# Patient Record
Sex: Female | Born: 1960 | Race: White | Hispanic: No | Marital: Married | Smoking: Never smoker
Health system: Southern US, Community
[De-identification: ages and names within clinical notes are randomized; demographics above are authoritative.]

## PROBLEM LIST (undated history)

## (undated) DIAGNOSIS — E739 Lactose intolerance, unspecified: Secondary | ICD-10-CM

## (undated) DIAGNOSIS — M549 Dorsalgia, unspecified: Secondary | ICD-10-CM

## (undated) DIAGNOSIS — L409 Psoriasis, unspecified: Secondary | ICD-10-CM

## (undated) DIAGNOSIS — C801 Malignant (primary) neoplasm, unspecified: Secondary | ICD-10-CM

## (undated) DIAGNOSIS — G4730 Sleep apnea, unspecified: Secondary | ICD-10-CM

## (undated) DIAGNOSIS — G4700 Insomnia, unspecified: Secondary | ICD-10-CM

## (undated) DIAGNOSIS — E669 Obesity, unspecified: Secondary | ICD-10-CM

## (undated) DIAGNOSIS — K760 Fatty (change of) liver, not elsewhere classified: Secondary | ICD-10-CM

## (undated) DIAGNOSIS — K219 Gastro-esophageal reflux disease without esophagitis: Secondary | ICD-10-CM

## (undated) DIAGNOSIS — E039 Hypothyroidism, unspecified: Secondary | ICD-10-CM

## (undated) DIAGNOSIS — E063 Autoimmune thyroiditis: Secondary | ICD-10-CM

## (undated) DIAGNOSIS — K589 Irritable bowel syndrome without diarrhea: Secondary | ICD-10-CM

## (undated) DIAGNOSIS — M2550 Pain in unspecified joint: Secondary | ICD-10-CM

## (undated) DIAGNOSIS — L900 Lichen sclerosus et atrophicus: Secondary | ICD-10-CM

## (undated) DIAGNOSIS — I10 Essential (primary) hypertension: Secondary | ICD-10-CM

## (undated) DIAGNOSIS — F329 Major depressive disorder, single episode, unspecified: Secondary | ICD-10-CM

## (undated) DIAGNOSIS — F419 Anxiety disorder, unspecified: Secondary | ICD-10-CM

## (undated) DIAGNOSIS — E785 Hyperlipidemia, unspecified: Secondary | ICD-10-CM

## (undated) DIAGNOSIS — F32A Depression, unspecified: Secondary | ICD-10-CM

## (undated) HISTORY — DX: Hypothyroidism, unspecified: E03.9

## (undated) HISTORY — DX: Pain in unspecified joint: M25.50

## (undated) HISTORY — DX: Gastro-esophageal reflux disease without esophagitis: K21.9

## (undated) HISTORY — DX: Autoimmune thyroiditis: E06.3

## (undated) HISTORY — DX: Depression, unspecified: F32.A

## (undated) HISTORY — DX: Sleep apnea, unspecified: G47.30

## (undated) HISTORY — DX: Psoriasis, unspecified: L40.9

## (undated) HISTORY — DX: Insomnia, unspecified: G47.00

## (undated) HISTORY — DX: Lactose intolerance, unspecified: E73.9

## (undated) HISTORY — DX: Anxiety disorder, unspecified: F41.9

## (undated) HISTORY — DX: Lichen sclerosus et atrophicus: L90.0

## (undated) HISTORY — PX: CATARACT EXTRACTION W/ INTRAOCULAR LENS IMPLANT: SHX1309

## (undated) HISTORY — DX: Dorsalgia, unspecified: M54.9

## (undated) HISTORY — DX: Fatty (change of) liver, not elsewhere classified: K76.0

## (undated) HISTORY — DX: Major depressive disorder, single episode, unspecified: F32.9

## (undated) HISTORY — DX: Hyperlipidemia, unspecified: E78.5

## (undated) HISTORY — DX: Obesity, unspecified: E66.9

## (undated) HISTORY — DX: Irritable bowel syndrome, unspecified: K58.9

## (undated) HISTORY — DX: Essential (primary) hypertension: I10

## (undated) HISTORY — PX: ANTERIOR DISC ARTHROPLASTY: SHX762

---

## 2004-06-10 ENCOUNTER — Ambulatory Visit: Payer: Self-pay | Admitting: Internal Medicine

## 2004-06-16 ENCOUNTER — Ambulatory Visit: Payer: Self-pay | Admitting: Internal Medicine

## 2004-12-30 ENCOUNTER — Ambulatory Visit: Payer: Self-pay | Admitting: Internal Medicine

## 2006-05-08 ENCOUNTER — Ambulatory Visit: Payer: Self-pay | Admitting: Internal Medicine

## 2007-07-04 ENCOUNTER — Ambulatory Visit: Payer: Self-pay | Admitting: Internal Medicine

## 2008-07-30 ENCOUNTER — Ambulatory Visit: Payer: Self-pay | Admitting: Internal Medicine

## 2009-08-19 ENCOUNTER — Ambulatory Visit: Payer: Self-pay | Admitting: Internal Medicine

## 2010-09-20 ENCOUNTER — Ambulatory Visit: Payer: Self-pay | Admitting: Internal Medicine

## 2011-10-11 ENCOUNTER — Ambulatory Visit: Payer: Self-pay

## 2011-12-18 ENCOUNTER — Ambulatory Visit: Payer: Self-pay | Admitting: Unknown Physician Specialty

## 2011-12-18 HISTORY — PX: COLONOSCOPY: SHX174

## 2012-01-11 ENCOUNTER — Ambulatory Visit: Payer: Self-pay | Admitting: Family Medicine

## 2013-03-11 ENCOUNTER — Ambulatory Visit: Payer: Self-pay | Admitting: Family Medicine

## 2013-03-24 ENCOUNTER — Ambulatory Visit: Payer: Self-pay | Admitting: Family Medicine

## 2013-10-03 ENCOUNTER — Ambulatory Visit: Payer: Self-pay | Admitting: Family Medicine

## 2014-11-18 ENCOUNTER — Ambulatory Visit
Admit: 2014-11-18 | Disposition: A | Discharge status: Home / Self Care | Payer: Self-pay | Attending: Family Medicine | Admitting: Family Medicine

## 2015-10-01 DIAGNOSIS — L570 Actinic keratosis: Secondary | ICD-10-CM | POA: Diagnosis not present

## 2015-10-01 DIAGNOSIS — D225 Melanocytic nevi of trunk: Secondary | ICD-10-CM | POA: Diagnosis not present

## 2015-10-01 DIAGNOSIS — I8392 Asymptomatic varicose veins of left lower extremity: Secondary | ICD-10-CM | POA: Diagnosis not present

## 2015-10-01 DIAGNOSIS — L400 Psoriasis vulgaris: Secondary | ICD-10-CM | POA: Diagnosis not present

## 2015-10-01 DIAGNOSIS — X32XXXA Exposure to sunlight, initial encounter: Secondary | ICD-10-CM | POA: Diagnosis not present

## 2015-10-01 DIAGNOSIS — Z85828 Personal history of other malignant neoplasm of skin: Secondary | ICD-10-CM | POA: Diagnosis not present

## 2015-10-15 DIAGNOSIS — E782 Mixed hyperlipidemia: Secondary | ICD-10-CM | POA: Diagnosis not present

## 2015-10-15 DIAGNOSIS — I10 Essential (primary) hypertension: Secondary | ICD-10-CM | POA: Diagnosis not present

## 2015-10-15 DIAGNOSIS — E039 Hypothyroidism, unspecified: Secondary | ICD-10-CM | POA: Diagnosis not present

## 2016-04-18 ENCOUNTER — Encounter: Payer: Self-pay | Admitting: Physician Assistant

## 2016-04-18 ENCOUNTER — Ambulatory Visit: Payer: Self-pay | Admitting: Physician Assistant

## 2016-04-18 VITALS — BP 140/88 | HR 84 | Temp 98.0°F

## 2016-04-18 DIAGNOSIS — M62838 Other muscle spasm: Secondary | ICD-10-CM

## 2016-04-18 MED ORDER — PREDNISONE 10 MG (48) PO TBPK: ORAL_TABLET | Freq: Every day | ORAL | 0 refills | Status: DC

## 2016-04-18 MED ORDER — BACLOFEN 10 MG PO TABS: 10.0000 mg | ORAL_TABLET | Freq: Three times a day (TID) | ORAL | 0 refills | Status: DC

## 2016-04-18 NOTE — Progress Notes (Signed)
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 

## 2016-05-25 ENCOUNTER — Other Ambulatory Visit: Payer: Self-pay | Admitting: Physician Assistant

## 2016-05-25 NOTE — Telephone Encounter (Signed)
Med refill approved, pt still having back and neck pain

## 2016-07-03 DIAGNOSIS — M2550 Pain in unspecified joint: Secondary | ICD-10-CM | POA: Diagnosis not present

## 2016-07-03 DIAGNOSIS — E782 Mixed hyperlipidemia: Secondary | ICD-10-CM | POA: Diagnosis not present

## 2016-07-03 DIAGNOSIS — I10 Essential (primary) hypertension: Secondary | ICD-10-CM | POA: Diagnosis not present

## 2016-07-03 DIAGNOSIS — E039 Hypothyroidism, unspecified: Secondary | ICD-10-CM | POA: Diagnosis not present

## 2016-07-12 DIAGNOSIS — H5213 Myopia, bilateral: Secondary | ICD-10-CM | POA: Diagnosis not present

## 2016-07-12 DIAGNOSIS — H43812 Vitreous degeneration, left eye: Secondary | ICD-10-CM | POA: Diagnosis not present

## 2016-07-12 DIAGNOSIS — H52223 Regular astigmatism, bilateral: Secondary | ICD-10-CM | POA: Diagnosis not present

## 2016-07-12 DIAGNOSIS — H524 Presbyopia: Secondary | ICD-10-CM | POA: Diagnosis not present

## 2016-07-27 ENCOUNTER — Encounter (INDEPENDENT_AMBULATORY_CARE_PROVIDER_SITE_OTHER): Payer: Self-pay | Admitting: Family Medicine

## 2016-08-10 ENCOUNTER — Encounter (INDEPENDENT_AMBULATORY_CARE_PROVIDER_SITE_OTHER): Payer: Self-pay | Admitting: Family Medicine

## 2016-08-10 ENCOUNTER — Ambulatory Visit (INDEPENDENT_AMBULATORY_CARE_PROVIDER_SITE_OTHER): Payer: 59 | Admitting: Family Medicine

## 2016-08-10 VITALS — BP 131/76 | HR 104 | Temp 98.3°F | Resp 14 | Ht 63.0 in | Wt 240.0 lb

## 2016-08-10 DIAGNOSIS — R9431 Abnormal electrocardiogram [ECG] [EKG]: Secondary | ICD-10-CM | POA: Diagnosis not present

## 2016-08-10 DIAGNOSIS — R5383 Other fatigue: Secondary | ICD-10-CM

## 2016-08-10 DIAGNOSIS — Z1331 Encounter for screening for depression: Secondary | ICD-10-CM

## 2016-08-10 DIAGNOSIS — Z0289 Encounter for other administrative examinations: Secondary | ICD-10-CM

## 2016-08-10 DIAGNOSIS — E039 Hypothyroidism, unspecified: Secondary | ICD-10-CM | POA: Diagnosis not present

## 2016-08-10 DIAGNOSIS — Z9189 Other specified personal risk factors, not elsewhere classified: Secondary | ICD-10-CM | POA: Diagnosis not present

## 2016-08-10 DIAGNOSIS — R0602 Shortness of breath: Secondary | ICD-10-CM

## 2016-08-10 DIAGNOSIS — Z1389 Encounter for screening for other disorder: Secondary | ICD-10-CM | POA: Diagnosis not present

## 2016-08-10 DIAGNOSIS — G4730 Sleep apnea, unspecified: Secondary | ICD-10-CM

## 2016-08-10 DIAGNOSIS — E7800 Pure hypercholesterolemia, unspecified: Secondary | ICD-10-CM

## 2016-08-10 NOTE — Progress Notes (Signed)
Office: [phone]  /  Fax: [phone]   HPI:   Chief Complaint: OBESITY  Katherine Smith (MR# [id number]) is a 55 y.o. female who presents on 08/10/2016 for obesity evaluation and treatment. Current BMI is Body mass index is 42.51 kg/m.Marland Kitchen Katherine Smith has struggled with obesity for years and has been unsuccessful in either losing weight or maintaining long term weight loss. Katherine Smith attended our information session and states she is currently in the action stage of change and ready to dedicate time achieving and maintaining a healthier weight.  Katherine Smith states her family eats meals together she thinks her family will eat healthier with  her her desired weight is150 she has been heavy most of  her life she started gaining weight over the last 10 years. her heaviest weight ever was 247 lbs. she has significant food cravings issues  she snacks frequently in the evenings she skips meals frequently she frequently makes poor food choices she has binge eating behaviors she struggles with emotional eating    Katherine Smith feels her energy is lower than it should be. This has worsened with weight gain and has not worsened recently. Katherine Smith reports daytime somnolence and  admits to waking up still tired. Patient is at risk for obstructive sleep apnea. Patent has a history of symptoms of daytime Katherine Smith, Epworth sleepiness scale and morning headache. Patient generally gets 5 or 6 hours of sleep per night, and states they generally have restless sleep. Snoring is present. Apneic episodes are present. Epworth Sleepiness Score is 17  Hyperlipidemia Katherine Smith has hyperlipidemia and has been trying to improve her cholesterol levels with intensive lifestyle modification including a low saturated fat diet, exercise and weight loss. Patient doesn't tolerate Statins. She denies any chest pain, claudication.  Hypothyroidism Hypothyroid, Hashimoto's, currently taking 255 MG daily Levothyroxine, +Katherine Smith.  Abnormal  EKG Q waves noted on anterior leads. No history of hear disease or chest pain per patient.  Sleep Apnea Sleep apnea, not using CPAP, positive Katherine Smith. She is having a difficult time losing weight.  Dyspnea on exertion Katherine Smith notes increasing shortness of breath with exercising and seems to be worsening over time with weight gain. She notes getting out of breath sooner with activity than she used to. This has not gotten worse recently. Katherine Smith denies orthopnea.  Depression Screen Katherine Smith Food and Mood (modified PHQ-9) score was  Depression screen PHQ 2/9 08/10/2016  Decreased Interest 2  Down, Depressed, Hopeless 2  PHQ - 2 Score 4  Altered sleeping 3  Tired, decreased energy 3  Change in appetite 2  Feeling bad or failure about yourself  1  Trouble concentrating 0  Moving slowly or fidgety/restless 1  Suicidal thoughts 0  PHQ-9 Score 14    ALLERGIES: Allergies  Allergen Reactions  . Atorvastatin     Other reaction(s): Muscle Pain  . Dexfenfluramine     Other reaction(s): Unknown    MEDICATIONS: Current Outpatient Prescriptions on File Prior to Visit  Medication Sig Dispense Refill  . baclofen (LIORESAL) 10 MG tablet TAKE 1 TABLET BY MOUTH THREE TIMES DAILY 30 tablet 0  . ezetimibe (ZETIA) 10 MG tablet TAKE 1 TABLET BY MOUTH ONCE DAILY.    Marland Kitchen levothyroxine (SYNTHROID, LEVOTHROID) 200 MCG tablet TAKE 1 TABLET BY MOUTH AS DIRECTED. TAKE ONE TABLET DAILY WITH 25MCG TO EQUAL 225MCG    . levothyroxine (SYNTHROID, LEVOTHROID) 25 MCG tablet TAKE 1 TABLET BY MOUTH AS DIRECTED. TAKE ONE TABLET WITH 200MCG DAILY TO EQUAL 225MCG    .  lisinopril-hydrochlorothiazide (PRINZIDE,ZESTORETIC) 10-12.5 MG tablet TAKE ONE TABLET BY MOUTH DAILY    . pantoprazole (PROTONIX) 40 MG tablet TAKE ONE TABLET BY MOUTH ONCE DAILY    . predniSONE (STERAPRED UNI-PAK 48 TAB) 10 MG (48) TBPK tablet Take by mouth daily. Use as directed (Patient not taking: Reported on 08/10/2016) 48 tablet 0   No current  facility-administered medications on file prior to visit.     PAST MEDICAL HISTORY: Past Medical History:  Diagnosis Date  . Back pain   . Depression   . Fatty liver   . GERD (gastroesophageal reflux disease)   . Hashimoto's thyroiditis   . HTN (hypertension)   . Hyperlipidemia   . Hypothyroidism   . IBS (irritable bowel syndrome)   . Joint pain   . Lactose intolerance   . Lichen sclerosus   . Obesity   . Psoriasis   . Sleep apnea     PAST SURGICAL HISTORY: Past Surgical History:  Procedure Laterality Date  . ANTERIOR DISC ARTHROPLASTY     2004    SOCIAL HISTORY: Social History  Substance Use Topics  . Smoking status: Never Smoker  . Smokeless tobacco: Never Used  . Alcohol use Not on file    FAMILY HISTORY: Family History  Problem Relation Age of Onset  . Hypertension Mother   . Hyperlipidemia Mother   . Hypertension Father     ROS: Review of Systems  Constitutional: Positive for malaise/Katherine Smith.  HENT: Positive for congestion.   Eyes:       Wear Glasses or Contacts  Cardiovascular: Positive for claudication.       Calf/Leg Pain with Walking  Gastrointestinal: Positive for heartburn.  Musculoskeletal: Positive for back pain, myalgias and neck pain.       Muscle Stiffness  Skin: Positive for itching.       Dryness  Neurological: Positive for dizziness and headaches.  Psychiatric/Behavioral: The patient has insomnia.        Stress    PHYSICAL EXAM: Blood pressure 131/76, pulse (!) 104, temperature 98.3 F (36.8 C), temperature source Oral, resp. rate 14, height 5\' 3"  (1.6 m), weight 240 lb (108.9 kg), last menstrual period 04/14/2016, SpO2 95 %. Body mass index is 42.51 kg/m. Physical Exam  Constitutional: She is oriented to person, place, and time. She appears well-developed and well-nourished.  HENT:  Head: Normocephalic and atraumatic.  Nose: Nose normal.  Eyes: EOM are normal. No scleral icterus.  Neck: Normal range of motion. Neck supple.  No thyromegaly present.  Cardiovascular: Normal rate and regular rhythm.   Murmur heard. Pulmonary/Chest: No respiratory distress.  Abdominal: Soft. There is no tenderness.  Obesity  Musculoskeletal: Normal range of motion. She exhibits edema.  Neurological: She is alert and oriented to person, place, and time. Coordination normal.  Skin: Skin is warm and dry.  Psychiatric: She has a normal mood and affect. Her behavior is normal.    RECENT LABS AND TESTS: BMET No results found for: NA, K, CL, CO2, GLUCOSE, BUN, CREATININE, CALCIUM, GFRNONAA, GFRAA No results found for: HGBA1C No results found for: INSULIN CBC No results found for: WBC, RBC, HGB, HCT, PLT, MCV, MCH, MCHC, RDW, LYMPHSABS, MONOABS, EOSABS, BASOSABS Iron/TIBC/Ferritin/ %Sat No results found for: IRON, TIBC, FERRITIN, IRONPCTSAT Lipid Panel  No results found for: CHOL, TRIG, HDL, CHOLHDL, VLDL, LDLCALC, LDLDIRECT Hepatic Function Panel  No results found for: PROT, ALBUMIN, AST, ALT, ALKPHOS, BILITOT, BILIDIR, IBILI No results found for: TSH  ECG  shows NSR with a  rate of 89 BPM  INDIRECT CALORIMETER done today shows a VO2 of 196 and a REE of 1367.    ASSESSMENT AND PLAN: Other Katherine Smith - Plan: EKG 12-Lead, Vitamin B12, CBC With Differential, Comprehensive metabolic panel, Hemoglobin A1c, Insulin, random, T3, T4, free, TSH, VITAMIN D 25 Hydroxy (Vit-D Deficiency, Fractures), Folate  Shortness of breath on exertion - Plan: Lipid Panel With LDL/HDL Ratio  Pure hypercholesterolemia  Hypothyroidism, unspecified type  Nonspecific abnormal electrocardiogram (ECG) (EKG) - Plan: ECHOCARDIOGRAM COMPLETE  Sleep apnea, unspecified type - Plan: Lipid Panel With LDL/HDL Ratio  PLAN:  Katherine Smith Katherine Smith was informed that her Katherine Smith may be related to obesity, depression or many other causes. Labs will be ordered, and in the meanwhile Sayana has agreed to work on diet, exercise and weight loss to help with Katherine Smith. Proper  sleep hygiene was discussed including the need for 7-8 hours of quality sleep each night. A sleep study was not ordered based on symptoms and Epworth score.  Hyperlipidemia Katherine Smith was informed of the American Heart Association Guidelines emphasize intensive lifestyle modifications as the first line treatment for hyperlipidemia. We discussed many lifestyle modifications today in depth, and Katherine Smith will continue to work on decreasing saturated fats such as fatty red meat, butter and many fried foods. She will also increase vegetables and lean protein in her diet and continue to work on exercise and her weight loss efforts.Will check labs and follow.  Exercise Induced Shortness of Breath Katherine Smith shortness of breath appears to be obesity related and exercise induced. She has agreed to work on weight loss and gradually increase exercise to treat her exercise induced shortness of breath. If Katherine Smith follows our instructions and loses weight without improvement of her shortness of breath, we will plan to refer to pulmonology. We will monitor this condition regularly. Katherine Smith agrees to this plan.  Hypothyroidism Will check labs and follow. Continue medications as is.  Abnormal EKG She will get echocardiogram tomorrow  and follow up to discuss results in 2 weeks.  Sleep Apnea Katherine Smith agrees to work on wearing her CPAP more regularly. We discussed ways to acclimate to mask.  Depression Screen Katherine Smith had a positive depression screening. Depression is commonly associated with obesity and often results in emotional eating behaviors. We will monitor this closely and work on CBT to help improve the non-hunger eating patterns. Referral to Psychology may be required if no improvement is seen as she continues in our clinic.  Diabetes risk counselling Katherine Smith was given extended (at least 15 minutes) diabetes prevention counseling today. She is 55 y.o. female and has risk factors for diabetes including obesity. We  discussed intensive lifestyle modifications today with an emphasis on weight loss as well as increasing exercise and decreasing simple carbohydrates in her diet.  Obesity Katherine Smith is currently in the action stage of change and her goal is to continue with weight loss efforts She has agreed to follow the Category 2 plan Katherine Smith has been instructed to work up to a goal of 150 minutes of combined cardio and strengthening exercise per week for weight loss and overall health benefits.   Katherine Smith has agreed to follow up with our clinic in 2 weeks. She was informed of the importance of frequent follow up visits to maximize her success with intensive lifestyle modifications for her multiple health conditions. She was informed we would discuss her lab results at her next visit unless there is a critical issue that needs to be addressed sooner. Katherine Smith agreed to keep her  next visit at the agreed upon time to discuss these results.  I, Doreene Nest, am acting as scribe for Dennard Nip, MD  I have reviewed the above documentation for accuracy and completeness, and I agree with the above. -Dennard Nip, MD

## 2016-08-11 ENCOUNTER — Other Ambulatory Visit: Payer: Self-pay

## 2016-08-11 ENCOUNTER — Ambulatory Visit (INDEPENDENT_AMBULATORY_CARE_PROVIDER_SITE_OTHER): Payer: 59

## 2016-08-11 DIAGNOSIS — R9431 Abnormal electrocardiogram [ECG] [EKG]: Secondary | ICD-10-CM

## 2016-08-11 LAB — COMPREHENSIVE METABOLIC PANEL
ALT: 44 IU/L — AB (ref 0–32)
AST: 42 IU/L — AB (ref 0–40)
Albumin/Globulin Ratio: 1.5 (ref 1.2–2.2)
Albumin: 4.7 g/dL (ref 3.5–5.5)
Alkaline Phosphatase: 83 IU/L (ref 39–117)
BUN/Creatinine Ratio: 20 (ref 9–23)
BUN: 17 mg/dL (ref 6–24)
Bilirubin Total: 0.7 mg/dL (ref 0.0–1.2)
CALCIUM: 10.2 mg/dL (ref 8.7–10.2)
CO2: 24 mmol/L (ref 18–29)
CREATININE: 0.86 mg/dL (ref 0.57–1.00)
Chloride: 95 mmol/L — ABNORMAL LOW (ref 96–106)
GFR, EST AFRICAN AMERICAN: 87 mL/min/{1.73_m2} (ref >59)
GFR, EST NON AFRICAN AMERICAN: 76 mL/min/{1.73_m2} (ref >59)
Globulin, Total: 3.2 g/dL (ref 1.5–4.5)
Glucose: 91 mg/dL (ref 65–99)
Potassium: 4 mmol/L (ref 3.5–5.2)
Sodium: 137 mmol/L (ref 134–144)
TOTAL PROTEIN: 7.9 g/dL (ref 6.0–8.5)

## 2016-08-11 LAB — VITAMIN B12: VITAMIN B 12: 215 pg/mL — AB (ref 232–1245)

## 2016-08-11 LAB — LIPID PANEL WITH LDL/HDL RATIO
CHOLESTEROL TOTAL: 221 mg/dL — AB (ref 100–199)
HDL: 73 mg/dL (ref >39)
LDL Calculated: 119 mg/dL — ABNORMAL HIGH (ref 0–99)
LDl/HDL Ratio: 1.6 ratio units (ref 0.0–3.2)
TRIGLYCERIDES: 143 mg/dL (ref 0–149)
VLDL CHOLESTEROL CAL: 29 mg/dL (ref 5–40)

## 2016-08-11 LAB — CBC WITH DIFFERENTIAL
BASOS: 1 %
Basophils Absolute: 0.1 10*3/uL (ref 0.0–0.2)
EOS (ABSOLUTE): 0.2 10*3/uL (ref 0.0–0.4)
EOS: 2 %
Hematocrit: 41.3 % (ref 34.0–46.6)
Hemoglobin: 13.7 g/dL (ref 11.1–15.9)
IMMATURE GRANS (ABS): 0 10*3/uL (ref 0.0–0.1)
IMMATURE GRANULOCYTES: 0 %
LYMPHS: 24 %
Lymphocytes Absolute: 2.1 10*3/uL (ref 0.7–3.1)
MCH: 27.8 pg (ref 26.6–33.0)
MCHC: 33.2 g/dL (ref 31.5–35.7)
MCV: 84 fL (ref 79–97)
MONOS ABS: 0.6 10*3/uL (ref 0.1–0.9)
Monocytes: 7 %
NEUTROS PCT: 66 %
Neutrophils Absolute: 6 10*3/uL (ref 1.4–7.0)
RBC: 4.93 x10E6/uL (ref 3.77–5.28)
RDW: 14 % (ref 12.3–15.4)
WBC: 9 10*3/uL (ref 3.4–10.8)

## 2016-08-11 LAB — VITAMIN D 25 HYDROXY (VIT D DEFICIENCY, FRACTURES): VIT D 25 HYDROXY: 10.3 ng/mL — AB (ref 30.0–100.0)

## 2016-08-11 LAB — HEMOGLOBIN A1C
ESTIMATED AVERAGE GLUCOSE: 105 mg/dL
Hgb A1c MFr Bld: 5.3 % (ref 4.8–5.6)

## 2016-08-11 LAB — INSULIN, RANDOM: INSULIN: 38.9 u[IU]/mL — AB (ref 2.6–24.9)

## 2016-08-11 LAB — FOLATE: Folate: 11.9 ng/mL (ref >3.0)

## 2016-08-11 LAB — TSH: TSH: 1.17 u[IU]/mL (ref 0.450–4.500)

## 2016-08-11 LAB — T4, FREE: FREE T4: 2.14 ng/dL — AB (ref 0.82–1.77)

## 2016-08-11 LAB — T3: T3, Total: 140 ng/dL (ref 71–180)

## 2016-08-16 ENCOUNTER — Ambulatory Visit: Payer: Self-pay

## 2016-08-24 ENCOUNTER — Ambulatory Visit (INDEPENDENT_AMBULATORY_CARE_PROVIDER_SITE_OTHER): Payer: 59 | Admitting: Family Medicine

## 2016-08-24 ENCOUNTER — Encounter (INDEPENDENT_AMBULATORY_CARE_PROVIDER_SITE_OTHER): Payer: Self-pay | Admitting: Family Medicine

## 2016-08-24 VITALS — BP 124/85 | HR 91 | Temp 98.4°F | Ht 63.0 in | Wt 230.0 lb

## 2016-08-24 DIAGNOSIS — R7989 Other specified abnormal findings of blood chemistry: Secondary | ICD-10-CM

## 2016-08-24 DIAGNOSIS — E8881 Metabolic syndrome: Secondary | ICD-10-CM | POA: Diagnosis not present

## 2016-08-24 DIAGNOSIS — E785 Hyperlipidemia, unspecified: Secondary | ICD-10-CM | POA: Diagnosis not present

## 2016-08-24 DIAGNOSIS — E559 Vitamin D deficiency, unspecified: Secondary | ICD-10-CM | POA: Diagnosis not present

## 2016-08-24 DIAGNOSIS — Z9189 Other specified personal risk factors, not elsewhere classified: Secondary | ICD-10-CM

## 2016-08-24 DIAGNOSIS — E88819 Insulin resistance, unspecified: Secondary | ICD-10-CM | POA: Insufficient documentation

## 2016-08-24 DIAGNOSIS — R945 Abnormal results of liver function studies: Secondary | ICD-10-CM

## 2016-08-24 MED ORDER — VITAMIN D (ERGOCALCIFEROL) 1.25 MG (50000 UNIT) PO CAPS: 50000.0000 [IU] | ORAL_CAPSULE | ORAL | 0 refills | Status: DC

## 2016-08-24 MED ORDER — METFORMIN HCL 500 MG PO TABS: 500.0000 mg | ORAL_TABLET | Freq: Every day | ORAL | 0 refills | Status: DC

## 2016-08-24 NOTE — Progress Notes (Signed)
Office: [phone]  /  Fax: [phone]   HPI:   Chief Complaint: OBESITY Katherine Smith is here to discuss her progress with her obesity treatment plan. She is following her eating plan approximately 90 % of the time and states she is exercising 0 minutes 0 times per week. Katherine Smith is currently struggling with increased carb cravings.  Her weight is 230 lb (104.3 kg) today and had a weight loss of 10 lbs since her last visit. She has lost 10 lbs since starting treatment with [place]. Patient did well with weight loss but noticed an increased in carb cravings. She noted dinner was boring and had a difficult time following the plan closely at dinner.   Insulin Resistance Katherine Smith has a new diagnosis of insulin resistance based on her elevated fasting insulin level >5. Although Katherine Smith's blood glucose readings and A1C are still under good control, insulin resistance puts her at greater risk of metabolic syndrome and diabetes. She is not taking metformin currently and continues to work on diet and exercise to decrease risk of diabetes. She is positive for polyphagia with it worsening in the evening.   Vitamin D deficiency Katherine Smith has a diagnosis of vitamin D deficiency. She is not currently taking vit D and denies nausea, vomiting or muscle weakness. She is positive for fatigue.   Elevated Liver Function  Patient has a history of fatty liver per Ultrasound, she denies abdominal pain, negative for jaundice.  Hyperlipidemia Katherine Smith has hyperlipidemia and has been trying to improve her cholesterol levels with intensive lifestyle modification including a low saturated fat diet, exercise and weight loss. She denies any chest pain, claudication or myalgias. Her HDL is good but LDL is elevated but not to the level that statins are needed yet. She denies chest pain.   At risk for diabetes Katherine Smith is at higher than averagerisk for developing diabetes due to her obesity. She currently denies polyuria or  polydipsia.    Wt Readings from Last 500 Encounters:  08/24/16 230 lb (104.3 kg)  08/10/16 240 lb (108.9 kg)     ALLERGIES: Allergies  Allergen Reactions  . Atorvastatin     Other reaction(s): Muscle Pain  . Dexfenfluramine     Other reaction(s): Unknown    MEDICATIONS: Current Outpatient Prescriptions on File Prior to Visit  Medication Sig Dispense Refill  . Aspirin-Acetaminophen-Caffeine (EXCEDRIN MIGRAINE PO) Take 1 tablet by mouth daily as needed.    . baclofen (LIORESAL) 10 MG tablet TAKE 1 TABLET BY MOUTH THREE TIMES DAILY 30 tablet 0  . clobetasol (TEMOVATE) 0.05 % external solution Apply 1 application topically 2 (two) times daily.    . clobetasol ointment (TEMOVATE) [id number] % Apply 1 application topically 2 (two) times daily.    Marland Kitchen ezetimibe (ZETIA) 10 MG tablet TAKE 1 TABLET BY MOUTH ONCE DAILY.    Marland Kitchen levothyroxine (SYNTHROID, LEVOTHROID) 200 MCG tablet TAKE 1 TABLET BY MOUTH AS DIRECTED. TAKE ONE TABLET DAILY WITH 25MCG TO EQUAL 225MCG    . levothyroxine (SYNTHROID, LEVOTHROID) 25 MCG tablet TAKE 1 TABLET BY MOUTH AS DIRECTED. TAKE ONE TABLET WITH 200MCG DAILY TO EQUAL 225MCG    . lisinopril-hydrochlorothiazide (PRINZIDE,ZESTORETIC) 10-12.5 MG tablet TAKE ONE TABLET BY MOUTH DAILY    . naproxen sodium (ANAPROX) 220 MG tablet Take 220 mg by mouth daily as needed.    . pantoprazole (PROTONIX) 40 MG tablet TAKE ONE TABLET BY MOUTH ONCE DAILY    . predniSONE (STERAPRED UNI-PAK 48 TAB) 10 MG (48) TBPK tablet Take by mouth  daily. Use as directed (Patient not taking: Reported on 08/10/2016) 48 tablet 0   No current facility-administered medications on file prior to visit.     PAST MEDICAL HISTORY: Past Medical History:  Diagnosis Date  . Back pain   . Depression   . Fatty liver   . GERD (gastroesophageal reflux disease)   . Hashimoto's thyroiditis   . HTN (hypertension)   . Hyperlipidemia   . Hypothyroidism   . IBS (irritable bowel syndrome)   . Joint pain   . Lactose  intolerance   . Lichen sclerosus   . Obesity   . Psoriasis   . Sleep apnea     PAST SURGICAL HISTORY: Past Surgical History:  Procedure Laterality Date  . ANTERIOR DISC ARTHROPLASTY     2004    SOCIAL HISTORY: Social History  Substance Use Topics  . Smoking status: Never Smoker  . Smokeless tobacco: Never Used  . Alcohol use Not on file    FAMILY HISTORY: Family History  Problem Relation Age of Onset  . Hypertension Mother   . Hyperlipidemia Mother   . Hypertension Father     ROS: Review of Systems  Constitutional: Positive for malaise/fatigue and weight loss.  Endo/Heme/Allergies:       Polyphagia  All other systems reviewed and are negative.   PHYSICAL EXAM: Blood pressure 124/85, pulse 91, temperature 98.4 F (36.9 C), temperature source Oral, height 5\' 3"  (1.6 m), weight 230 lb (104.3 kg), last menstrual period 04/14/2016, SpO2 98 %. Body mass index is 40.74 kg/m. Physical Exam  Constitutional: She is oriented to person, place, and time. She appears well-developed and well-nourished.  Cardiovascular: Normal rate.   Pulmonary/Chest: Effort normal.  Musculoskeletal: Normal range of motion.  Neurological: She is oriented to person, place, and time.  Skin: Skin is warm and dry.  Psychiatric: She has a normal mood and affect. Her behavior is normal.  Vitals reviewed.   RECENT LABS AND TESTS: BMET    Component Value Date/Time   NA 137 08/10/2016 1556   K 4.0 08/10/2016 1556   CL 95 (L) 08/10/2016 1556   CO2 24 08/10/2016 1556   GLUCOSE 91 08/10/2016 1556   BUN 17 08/10/2016 1556   CREATININE 0.86 08/10/2016 1556   CALCIUM 10.2 08/10/2016 1556   GFRNONAA 76 08/10/2016 1556   GFRAA 87 08/10/2016 1556   Lab Results  Component Value Date   HGBA1C 5.3 08/10/2016   Lab Results  Component Value Date   INSULIN 38.9 (H) 08/10/2016   CBC    Component Value Date/Time   WBC 9.0 08/10/2016 1556   RBC 4.93 08/10/2016 1556   HCT 41.3 08/10/2016 1556    MCV 84 08/10/2016 1556   MCH 27.8 08/10/2016 1556   MCHC 33.2 08/10/2016 1556   RDW 14.0 08/10/2016 1556   LYMPHSABS 2.1 08/10/2016 1556   EOSABS 0.2 08/10/2016 1556   BASOSABS 0.1 08/10/2016 1556   Iron/TIBC/Ferritin/ %Sat No results found for: IRON, TIBC, FERRITIN, IRONPCTSAT Lipid Panel     Component Value Date/Time   CHOL 221 (H) 08/10/2016 1556   TRIG 143 08/10/2016 1556   HDL 73 08/10/2016 1556   LDLCALC 119 (H) 08/10/2016 1556   Hepatic Function Panel     Component Value Date/Time   PROT 7.9 08/10/2016 1556   ALBUMIN 4.7 08/10/2016 1556   AST 42 (H) 08/10/2016 1556   ALT 44 (H) 08/10/2016 1556   ALKPHOS 83 08/10/2016 1556   BILITOT 0.7 08/10/2016 1556  Component Value Date/Time   TSH 1.170 08/10/2016 1556    ASSESSMENT AND PLAN: Insulin resistance - Plan: metFORMIN (GLUCOPHAGE) 500 MG tablet  Vitamin D deficiency - Plan: Vitamin D, Ergocalciferol, (DRISDOL) 50000 units CAPS capsule  Elevated liver function tests  Hyperlipidemia, unspecified hyperlipidemia type  At risk for diabetes mellitus  Morbid obesity ([place])  PLAN:  Insulin Resistance Katherine Smith will continue to work on weight loss, exercise, and decreasing simple carbohydrates in her diet to help decrease the risk of diabetes. We dicussed metformin including benefits and risks. She was informed that eating too many simple carbohydrates or too many calories at one sitting increases the likelihood of GI side effects. Katherine Smith requested metformin for now and prescription was written today for 30 tabs and 0 refills.  Katherine Smith agreed to follow up with [place] as directed to monitor her progress.   Vitamin D Deficiency Katherine Smith was informed that low vitamin D levels contributes to fatigue and are associated with obesity, breast, and colon cancer. She agrees to continue to take prescription Vit D @50 ,000 IU every week #4 with 0 refills and will follow up for routine testing of vitamin D, at least 2-3 times per year. She  was informed of the risk of over-replacement of vitamin D and agrees to not increase her dose unless he discusses this with [place] first.  Elevated Liver Function  Pt advised weight loss is the only treatment for NAFLD. She will work on weight loss and we will recheck labs in 3 months  Hyperlipidemia Katherine Smith was informed of the American Heart Association Guidelines emphasize intensive lifestyle modifications as the first line treatment for hyperlipidemia. We discussed many lifestyle modifications today in depth, and Katherine Smith will continue to work on decreasing saturated fats such as fatty red meat, butter and many fried foods. She will also increase vegetables and lean protein in her diet and continue to work on diet/exercise and her weight loss efforts.  Diabetes risk counselling Katherine Smith was given extended (at least 15 minutes) diabetes prevention counseling today. She is 56 y.o. female and has risk factors for diabetes including obesity. We discussed intensive lifestyle modifications today with an emphasis on weight loss as well as increasing exercise and decreasing simple carbohydrates in her diet.   Obesity Katherine Smith is currently in the action stage of change. As such, her goal is to continue with weight loss efforts She has agreed to keep a food journal with 300-400 calories and 25 protein daily and follow the Category 2 plan Katherine Smith has been instructed to work up to a goal of 150 minutes of combined cardio and strengthening exercise per week for weight loss and overall health benefits. We discussed the following Behavioral Modification Stratagies today: increasing lean protein intake, decreasing simple carbohydrates , increasing fiber rich foods and work on meal planning and easy cooking plans  Katherine Smith has agreed to follow up with our clinic in 2 weeks. She was informed of the importance of frequent follow up visits to maximize her success with intensive lifestyle modifications for her multiple health  conditions.  I, April Moore, am acting as Education administrator for Dennard Nip, MD  I have reviewed the above documentation for accuracy and completeness, and I agree with the above. -Dennard Nip, MD

## 2016-09-07 ENCOUNTER — Encounter (INDEPENDENT_AMBULATORY_CARE_PROVIDER_SITE_OTHER): Payer: Self-pay | Admitting: Family Medicine

## 2016-09-07 ENCOUNTER — Ambulatory Visit (INDEPENDENT_AMBULATORY_CARE_PROVIDER_SITE_OTHER): Payer: 59 | Admitting: Family Medicine

## 2016-09-07 DIAGNOSIS — E8881 Metabolic syndrome: Secondary | ICD-10-CM | POA: Diagnosis not present

## 2016-09-07 DIAGNOSIS — E063 Autoimmune thyroiditis: Secondary | ICD-10-CM | POA: Insufficient documentation

## 2016-09-07 DIAGNOSIS — G4730 Sleep apnea, unspecified: Secondary | ICD-10-CM | POA: Insufficient documentation

## 2016-09-07 NOTE — Progress Notes (Signed)
Office: [phone]  /  Fax: [phone]   HPI:   Chief Complaint: OBESITY Katherine Smith is here to discuss her progress with her obesity treatment plan. She is following her eating plan approximately 85 % of the time and states she is walking 8,000 steps a day. She continues to do well with weight loss. Katherine Smith is currently struggling with some work sabotage and some evening cravings.  Her weight is 225 lb (102.1 kg) today and has had a weight loss of 5 pounds over a period of 2 weeks since her last visit. She has lost 15 lbs since starting treatment with [place].  Insulin Resistance Katherine Smith has a diagnosis of insulin resistance based on her elevated fasting insulin level >5. Although Katherine Smith's blood glucose readings are still under good control, insulin resistance puts her at greater risk of metabolic syndrome and diabetes. She started taking metformin 2 weeks ago, often forgetting second dose of metformin, she continues to work on diet and exercise to decrease risk of diabetes.  Wt Readings from Last 500 Encounters:  09/07/16 225 lb (102.1 kg)  08/24/16 230 lb (104.3 kg)  08/10/16 240 lb (108.9 kg)     ALLERGIES: Allergies  Allergen Reactions   Atorvastatin     Other reaction(s): Muscle Pain   Dexfenfluramine     Other reaction(s): Unknown    MEDICATIONS: Current Outpatient Prescriptions on File Prior to Visit  Medication Sig Dispense Refill   Aspirin-Acetaminophen-Caffeine (EXCEDRIN MIGRAINE PO) Take 1 tablet by mouth daily as needed.     baclofen (LIORESAL) 10 MG tablet TAKE 1 TABLET BY MOUTH THREE TIMES DAILY 30 tablet 0   clobetasol (TEMOVATE) 0.05 % external solution Apply 1 application topically 2 (two) times daily.     clobetasol ointment (TEMOVATE) AB-123456789 % Apply 1 application topically 2 (two) times daily.     ezetimibe (ZETIA) 10 MG tablet TAKE 1 TABLET BY MOUTH ONCE DAILY.     levothyroxine (SYNTHROID, LEVOTHROID) 200 MCG tablet TAKE 1 TABLET BY MOUTH AS DIRECTED. TAKE  ONE TABLET DAILY WITH 25MCG TO EQUAL 225MCG     levothyroxine (SYNTHROID, LEVOTHROID) 25 MCG tablet TAKE 1 TABLET BY MOUTH AS DIRECTED. TAKE ONE TABLET WITH 200MCG DAILY TO EQUAL 225MCG     lisinopril-hydrochlorothiazide (PRINZIDE,ZESTORETIC) 10-12.5 MG tablet TAKE ONE TABLET BY MOUTH DAILY     metFORMIN (GLUCOPHAGE) 500 MG tablet Take 1 tablet (500 mg total) by mouth daily with breakfast. 30 tablet 0   naproxen sodium (ANAPROX) 220 MG tablet Take 220 mg by mouth daily as needed.     pantoprazole (PROTONIX) 40 MG tablet TAKE ONE TABLET BY MOUTH ONCE DAILY     Vitamin D, Ergocalciferol, (DRISDOL) 50000 units CAPS capsule Take 1 capsule (50,000 Units total) by mouth every 7 (seven) days. 4 capsule 0   No current facility-administered medications on file prior to visit.     PAST MEDICAL HISTORY: Past Medical History:  Diagnosis Date   Back pain    Depression    Fatty liver    GERD (gastroesophageal reflux disease)    Hashimoto's thyroiditis    HTN (hypertension)    Hyperlipidemia    Hypothyroidism    IBS (irritable bowel syndrome)    Joint pain    Lactose intolerance    Lichen sclerosus    Obesity    Psoriasis    Sleep apnea     PAST SURGICAL HISTORY: Past Surgical History:  Procedure Laterality Date   ANTERIOR DISC ARTHROPLASTY     2004  SOCIAL HISTORY: Social History  Substance Use Topics   Smoking status: Never Smoker   Smokeless tobacco: Never Used   Alcohol use Not on file    FAMILY HISTORY: Family History  Problem Relation Age of Onset   Hypertension Mother    Hyperlipidemia Mother    Hypertension Father     ROS: Review of Systems  Constitutional: Positive for weight loss.  Endo/Heme/Allergies:       Positive polyphagia    PHYSICAL EXAM: Blood pressure 116/71, pulse 83, temperature 97.9 F (36.6 C), temperature source Oral, height 5\' 3"  (1.6 m), weight 225 lb (102.1 kg), SpO2 98 %. Body mass index is 39.86  kg/m. Physical Exam  Constitutional: She is oriented to person, place, and time. She appears well-developed and well-nourished.  Cardiovascular: Normal rate.   Pulmonary/Chest: Effort normal.  Musculoskeletal: Normal range of motion. She exhibits edema (trace edema bilateral lower extremeties).  Neurological: She is oriented to person, place, and time.  Skin: Skin is warm and dry.  Psychiatric: She has a normal mood and affect. Her behavior is normal.  Vitals reviewed.   RECENT LABS AND TESTS: BMET    Component Value Date/Time   NA 137 08/10/2016 1556   K 4.0 08/10/2016 1556   CL 95 (L) 08/10/2016 1556   CO2 24 08/10/2016 1556   GLUCOSE 91 08/10/2016 1556   BUN 17 08/10/2016 1556   CREATININE 0.86 08/10/2016 1556   CALCIUM 10.2 08/10/2016 1556   GFRNONAA 76 08/10/2016 1556   GFRAA 87 08/10/2016 1556   Lab Results  Component Value Date   HGBA1C 5.3 08/10/2016   Lab Results  Component Value Date   INSULIN 38.9 (H) 08/10/2016   CBC    Component Value Date/Time   WBC 9.0 08/10/2016 1556   RBC 4.93 08/10/2016 1556   HCT 41.3 08/10/2016 1556   MCV 84 08/10/2016 1556   MCH 27.8 08/10/2016 1556   MCHC 33.2 08/10/2016 1556   RDW 14.0 08/10/2016 1556   LYMPHSABS 2.1 08/10/2016 1556   EOSABS 0.2 08/10/2016 1556   BASOSABS 0.1 08/10/2016 1556   Iron/TIBC/Ferritin/ %Sat No results found for: IRON, TIBC, FERRITIN, IRONPCTSAT Lipid Panel     Component Value Date/Time   CHOL 221 (H) 08/10/2016 1556   TRIG 143 08/10/2016 1556   HDL 73 08/10/2016 1556   LDLCALC 119 (H) 08/10/2016 1556   Hepatic Function Panel     Component Value Date/Time   PROT 7.9 08/10/2016 1556   ALBUMIN 4.7 08/10/2016 1556   AST 42 (H) 08/10/2016 1556   ALT 44 (H) 08/10/2016 1556   ALKPHOS 83 08/10/2016 1556   BILITOT 0.7 08/10/2016 1556      Component Value Date/Time   TSH 1.170 08/10/2016 1556    ASSESSMENT AND PLAN: Morbid obesity (HCC)  Insulin resistance  PLAN: Insulin  Resistance Katherine Smith will continue to work on weight loss, exercise, and decreasing simple carbohydrates in her diet to help decrease the risk of diabetes. We dicussed metformin including benefits and risks. She was informed that eating too many simple carbohydrates or too many calories at one sitting increases the likelihood of GI side effects. Letasha agrees to continue metformin and okay to take second dose at dinner. Will re-check labs in 2 months. Holley agreed to follow up with [place] as directed to monitor her progress.  We spent > than 50% of the 15 minute visit on the counseling as documented in the note.  Obesity Trula is currently in the action stage  of change. As such, her goal is to continue with weight loss efforts She has agreed to change to keeping a food journal with 1200 calories and 75+ grams of protein protein daily. Adalynn has been instructed to work up to a goal of 150 minutes of combined cardio and strengthening exercise per week or 8,000 steps a day for weight loss and overall health benefits. We discussed the following Behavioral Modification Stratagies today: increasing lean protein intake, dealing with family or coworker sabotage and ways to avoid night time snacking.  Sherea has agreed to follow up with our clinic in 2 weeks. She was informed of the importance of frequent follow up visits to maximize her success with intensive lifestyle modifications for her multiple health conditions.  I, Doreene Nest, am acting as scribe for Dennard Nip, MD  I have reviewed the above documentation for accuracy and completeness, and I agree with the above. -Dennard Nip, MD

## 2016-09-26 ENCOUNTER — Encounter (INDEPENDENT_AMBULATORY_CARE_PROVIDER_SITE_OTHER): Payer: Self-pay | Admitting: Family Medicine

## 2016-09-26 ENCOUNTER — Ambulatory Visit (INDEPENDENT_AMBULATORY_CARE_PROVIDER_SITE_OTHER): Payer: 59 | Admitting: Family Medicine

## 2016-09-26 VITALS — BP 110/73 | HR 90 | Temp 97.9°F | Ht 63.0 in | Wt 219.0 lb

## 2016-09-26 DIAGNOSIS — E559 Vitamin D deficiency, unspecified: Secondary | ICD-10-CM | POA: Diagnosis not present

## 2016-09-26 DIAGNOSIS — E669 Obesity, unspecified: Secondary | ICD-10-CM | POA: Insufficient documentation

## 2016-09-26 DIAGNOSIS — Z6832 Body mass index (BMI) 32.0-32.9, adult: Secondary | ICD-10-CM | POA: Insufficient documentation

## 2016-09-26 DIAGNOSIS — Z6833 Body mass index (BMI) 33.0-33.9, adult: Secondary | ICD-10-CM | POA: Insufficient documentation

## 2016-09-26 DIAGNOSIS — R7303 Prediabetes: Secondary | ICD-10-CM | POA: Diagnosis not present

## 2016-09-27 ENCOUNTER — Other Ambulatory Visit (INDEPENDENT_AMBULATORY_CARE_PROVIDER_SITE_OTHER): Payer: Self-pay | Admitting: Family Medicine

## 2016-09-27 DIAGNOSIS — E559 Vitamin D deficiency, unspecified: Secondary | ICD-10-CM

## 2016-09-27 DIAGNOSIS — E8881 Metabolic syndrome: Secondary | ICD-10-CM

## 2016-09-27 MED ORDER — VITAMIN D (ERGOCALCIFEROL) 1.25 MG (50000 UNIT) PO CAPS: 50000.0000 [IU] | ORAL_CAPSULE | ORAL | 0 refills | Status: DC

## 2016-09-27 NOTE — Progress Notes (Signed)
Office: [phone]  /  Fax: [phone]   HPI:   Chief Complaint: OBESITY Katherine Smith is here to discuss her progress with her obesity treatment plan. She is following her eating plan approximately 90 % of the time and states she is exercising 8,000 steps 7 times per week. She continues to lose weight well and is starting to increase her steps. She is journaling more and doing well with eating her protein but is currently struggling with getting bored at times.  Her weight is 219 lb (99.3 kg) today and has had a weight loss of 6 pounds over a period of 2 to 3 weeks since her last visit. She has lost 21 lbs since starting treatment with [place].  Vitamin D deficiency Katherine Smith has a diagnosis of vitamin D deficiency. She is currently taking vit D, fatigue is improving, not yet at goal. She denies nausea, vomiting or muscle weakness.  Pre-Diabetes Katherine Smith has a diagnosis of prediabetes based on her elevated HgA1c at 5.3 and was informed this puts her at greater risk of developing diabetes. She is taking metformin currently and is sometimes missing the dose at lunch. she continues to work on diet and exercise to decrease risk of diabetes. She denies nausea, diarrhea or hypoglycemia.  At risk for diabetes Katherine Smith is at higher than average risk for developing diabetes due to her obesity. She currently denies polyuria or polydipsia.   Wt Readings from Last 500 Encounters:  09/26/16 219 lb (99.3 kg)  09/07/16 225 lb (102.1 kg)  08/24/16 230 lb (104.3 kg)  08/10/16 240 lb (108.9 kg)     ALLERGIES: Allergies  Allergen Reactions  . Atorvastatin     Other reaction(s): Muscle Pain  . Dexfenfluramine     Other reaction(s): Unknown    MEDICATIONS: Current Outpatient Prescriptions on File Prior to Visit  Medication Sig Dispense Refill  . Aspirin-Acetaminophen-Caffeine (EXCEDRIN MIGRAINE PO) Take 1 tablet by mouth daily as needed.    . baclofen (LIORESAL) 10 MG tablet TAKE 1 TABLET BY MOUTH THREE  TIMES DAILY 30 tablet 0  . clobetasol (TEMOVATE) 0.05 % external solution Apply 1 application topically 2 (two) times daily.    . clobetasol ointment (TEMOVATE) AB-123456789 % Apply 1 application topically 2 (two) times daily.    Marland Kitchen ezetimibe (ZETIA) 10 MG tablet TAKE 1 TABLET BY MOUTH ONCE DAILY.    Marland Kitchen levothyroxine (SYNTHROID, LEVOTHROID) 200 MCG tablet TAKE 1 TABLET BY MOUTH AS DIRECTED. TAKE ONE TABLET DAILY WITH 25MCG TO EQUAL 225MCG    . levothyroxine (SYNTHROID, LEVOTHROID) 25 MCG tablet TAKE 1 TABLET BY MOUTH AS DIRECTED. TAKE ONE TABLET WITH 200MCG DAILY TO EQUAL 225MCG    . lisinopril-hydrochlorothiazide (PRINZIDE,ZESTORETIC) 10-12.5 MG tablet TAKE ONE TABLET BY MOUTH DAILY    . metFORMIN (GLUCOPHAGE) 500 MG tablet Take 1 tablet (500 mg total) by mouth daily with breakfast. 30 tablet 0  . naproxen sodium (ANAPROX) 220 MG tablet Take 220 mg by mouth daily as needed.    . pantoprazole (PROTONIX) 40 MG tablet TAKE ONE TABLET BY MOUTH ONCE DAILY    . Vitamin D, Ergocalciferol, (DRISDOL) 50000 units CAPS capsule Take 1 capsule (50,000 Units total) by mouth every 7 (seven) days. 4 capsule 0   No current facility-administered medications on file prior to visit.     PAST MEDICAL HISTORY: Past Medical History:  Diagnosis Date  . Back pain   . Depression   . Fatty liver   . GERD (gastroesophageal reflux disease)   . Hashimoto's  thyroiditis   . HTN (hypertension)   . Hyperlipidemia   . Hypothyroidism   . IBS (irritable bowel syndrome)   . Joint pain   . Lactose intolerance   . Lichen sclerosus   . Obesity   . Psoriasis   . Sleep apnea     PAST SURGICAL HISTORY: Past Surgical History:  Procedure Laterality Date  . ANTERIOR DISC ARTHROPLASTY     2004    SOCIAL HISTORY: Social History  Substance Use Topics  . Smoking status: Never Smoker  . Smokeless tobacco: Never Used  . Alcohol use Not on file    FAMILY HISTORY: Family History  Problem Relation Age of Onset  . Hypertension  Mother   . Hyperlipidemia Mother   . Hypertension Father     ROS: Review of Systems  Constitutional: Positive for malaise/fatigue.  Gastrointestinal: Negative for diarrhea, nausea and vomiting.  Genitourinary: Negative for frequency.  Musculoskeletal:       Negative muscle weakness  Endo/Heme/Allergies: Negative for polydipsia.       Negative hypoglycemia    PHYSICAL EXAM: Blood pressure 110/73, pulse 90, temperature 97.9 F (36.6 C), temperature source Oral, height 5\' 3"  (1.6 m), weight 219 lb (99.3 kg), SpO2 100 %. Body mass index is 38.79 kg/m. Physical Exam  Constitutional: She is oriented to person, place, and time. She appears well-developed and well-nourished.  Cardiovascular: Normal rate.   Pulmonary/Chest: Effort normal.  Musculoskeletal: Normal range of motion.  Neurological: She is oriented to person, place, and time.  Skin: Skin is warm and dry.  Psychiatric: She has a normal mood and affect. Her behavior is normal.  Vitals reviewed.   RECENT LABS AND TESTS: BMET    Component Value Date/Time   NA 137 08/10/2016 1556   K 4.0 08/10/2016 1556   CL 95 (L) 08/10/2016 1556   CO2 24 08/10/2016 1556   GLUCOSE 91 08/10/2016 1556   BUN 17 08/10/2016 1556   CREATININE 0.86 08/10/2016 1556   CALCIUM 10.2 08/10/2016 1556   GFRNONAA 76 08/10/2016 1556   GFRAA 87 08/10/2016 1556   Lab Results  Component Value Date   HGBA1C 5.3 08/10/2016   Lab Results  Component Value Date   INSULIN 38.9 (H) 08/10/2016   CBC    Component Value Date/Time   WBC 9.0 08/10/2016 1556   RBC 4.93 08/10/2016 1556   HCT 41.3 08/10/2016 1556   MCV 84 08/10/2016 1556   MCH 27.8 08/10/2016 1556   MCHC 33.2 08/10/2016 1556   RDW 14.0 08/10/2016 1556   LYMPHSABS 2.1 08/10/2016 1556   EOSABS 0.2 08/10/2016 1556   BASOSABS 0.1 08/10/2016 1556   Iron/TIBC/Ferritin/ %Sat No results found for: IRON, TIBC, FERRITIN, IRONPCTSAT Lipid Panel     Component Value Date/Time   CHOL 221 (H)  08/10/2016 1556   TRIG 143 08/10/2016 1556   HDL 73 08/10/2016 1556   LDLCALC 119 (H) 08/10/2016 1556   Hepatic Function Panel     Component Value Date/Time   PROT 7.9 08/10/2016 1556   ALBUMIN 4.7 08/10/2016 1556   AST 42 (H) 08/10/2016 1556   ALT 44 (H) 08/10/2016 1556   ALKPHOS 83 08/10/2016 1556   BILITOT 0.7 08/10/2016 1556      Component Value Date/Time   TSH 1.170 08/10/2016 1556    ASSESSMENT AND PLAN: Vitamin D deficiency - Plan: Vitamin D, Ergocalciferol, (DRISDOL) 50000 units CAPS capsule  Prediabetes  Obesity (BMI 30-39.9)  PLAN:  Vitamin D Deficiency Katherine Smith was informed that  low vitamin D levels contributes to fatigue and are associated with obesity, breast, and colon cancer. She agrees to continue to take prescription Vit D @50 ,000 IU every week and will follow up for routine testing of vitamin D, at least 2-3 times per year. She was informed of the risk of over-replacement of vitamin D and agrees to not increase her dose unless he discusses this with [place] first.  Pre-Diabetes Katherine Smith will continue to work on weight loss, exercise, and decreasing simple carbohydrates in her diet to help decrease the risk of diabetes. We dicussed metformin including benefits and risks. She was informed that eating too many simple carbohydrates or too many calories at one sitting increases the likelihood of GI side effects. Katherine Smith declined metformin for now and a prescription was not written today. Katherine Smith agreed to follow up with [place] as directed to monitor her progress.  Diabetes risk counselling Katherine Smith was given extended (at least 15 minutes) diabetes prevention counseling today. She is 56 y.o. female and has risk factors for diabetes including obesity. We discussed intensive lifestyle modifications today with an emphasis on weight loss as well as increasing exercise and decreasing simple carbohydrates in her diet.  Obesity Katherine Smith is currently in the action stage of change. As such,  her goal is to continue with weight loss efforts She has agreed to keep a food journal with 1200 to 1400 calories and 75 grams of protein daily Katherine Smith has been instructed to work up to a goal of 150 minutes of combined cardio and strengthening exercise per week for weight loss and overall health benefits. We discussed the following Behavioral Modification Stratagies today: increasing lean protein intake, decreasing simple carbohydrates , increasing vegetables and increasing lower sugar fruits. P{rotein recipes given today.  Katherine Smith has agreed to follow up with our clinic in 2 weeks. She was informed of the importance of frequent follow up visits to maximize her success with intensive lifestyle modifications for her multiple health conditions.  I, Doreene Nest, am acting as scribe for Dennard Nip, MD  I have reviewed the above documentation for accuracy and completeness, and I agree with the above. -Dennard Nip, MD

## 2016-09-29 ENCOUNTER — Other Ambulatory Visit (INDEPENDENT_AMBULATORY_CARE_PROVIDER_SITE_OTHER): Payer: Self-pay | Admitting: Family Medicine

## 2016-09-29 DIAGNOSIS — D225 Melanocytic nevi of trunk: Secondary | ICD-10-CM | POA: Diagnosis not present

## 2016-09-29 DIAGNOSIS — D2261 Melanocytic nevi of right upper limb, including shoulder: Secondary | ICD-10-CM | POA: Diagnosis not present

## 2016-09-29 DIAGNOSIS — E8881 Metabolic syndrome: Secondary | ICD-10-CM

## 2016-09-29 DIAGNOSIS — Z85828 Personal history of other malignant neoplasm of skin: Secondary | ICD-10-CM | POA: Diagnosis not present

## 2016-09-29 DIAGNOSIS — L400 Psoriasis vulgaris: Secondary | ICD-10-CM | POA: Diagnosis not present

## 2016-10-10 ENCOUNTER — Ambulatory Visit (INDEPENDENT_AMBULATORY_CARE_PROVIDER_SITE_OTHER): Payer: 59 | Admitting: Family Medicine

## 2016-10-10 ENCOUNTER — Encounter (INDEPENDENT_AMBULATORY_CARE_PROVIDER_SITE_OTHER): Payer: Self-pay | Admitting: Family Medicine

## 2016-10-10 VITALS — BP 97/67 | HR 88 | Temp 97.8°F | Resp 16 | Ht 63.0 in | Wt 213.0 lb

## 2016-10-10 DIAGNOSIS — I10 Essential (primary) hypertension: Secondary | ICD-10-CM

## 2016-10-10 DIAGNOSIS — K219 Gastro-esophageal reflux disease without esophagitis: Secondary | ICD-10-CM | POA: Diagnosis not present

## 2016-10-10 DIAGNOSIS — E8881 Metabolic syndrome: Secondary | ICD-10-CM | POA: Diagnosis not present

## 2016-10-10 DIAGNOSIS — E66812 Obesity, class 2: Secondary | ICD-10-CM

## 2016-10-10 DIAGNOSIS — Z6837 Body mass index (BMI) 37.0-37.9, adult: Secondary | ICD-10-CM

## 2016-10-10 DIAGNOSIS — Z9189 Other specified personal risk factors, not elsewhere classified: Secondary | ICD-10-CM

## 2016-10-10 DIAGNOSIS — E88819 Insulin resistance, unspecified: Secondary | ICD-10-CM

## 2016-10-10 DIAGNOSIS — E669 Obesity, unspecified: Secondary | ICD-10-CM

## 2016-10-10 MED ORDER — LISINOPRIL 10 MG PO TABS: 10.0000 mg | ORAL_TABLET | Freq: Every day | ORAL | 0 refills | Status: DC

## 2016-10-10 NOTE — Progress Notes (Signed)
Office: [phone]  /  Fax: [phone]   HPI:   Chief Complaint: OBESITY Katherine Smith is here to discuss her progress with her obesity treatment plan. She is following her eating plan approximately 85 to 90 % of the time and states she is exercising 0 minutes 0 times per week. Katherine Smith has done well with journaling and weight loss but has had increased stress at home with daughter and sometimes is not eating at all. She has done well with increasing protein and eating 2 to 4 servings of vegetables per day. Her weight is 213 lb (96.6 kg) today and has had a weight loss of 5 pounds over a period of 2 weeks since her last visit. She has lost 27 lbs since starting treatment with [place].  Hypertension Katherine Smith is a 56 y.o. female with hypertension.  Katherine Smith denies chest pain or shortness of breath on exertion. Her blood pressure has dropped in the last 6 weeks with weight loss and diet. She is working weight loss to help control her blood pressure with the goal of decreasing her risk of heart attack and stroke. She noticed feeling lightheaded with positional change. [name] blood pressure is not currently controlled.  GERD  Insulin Resistance Lacee has a diagnosis of insulin resistance based on her elevated fasting insulin level >5. Although Katherine Smith's blood glucose readings are still under good control, insulin resistance puts her at greater risk of metabolic syndrome and diabetes. She tried Metformin but forgot to take frequently and is doing without. She continues to work on diet and exercise to decrease risk of diabetes.  At risk for diabetes Katherine Smith is at higher than average risk for developing diabetes due to her obesity. She currently denies polyuria or polydipsia.   Wt Readings from Last 500 Encounters:  10/10/16 213 lb (96.6 kg)  09/26/16 219 lb (99.3 kg)  09/07/16 225 lb (102.1 kg)  08/24/16 230 lb (104.3 kg)  08/10/16 240 lb (108.9 kg)     ALLERGIES: Allergies    Allergen Reactions  . Atorvastatin     Other reaction(s): Muscle Pain  . Dexfenfluramine     Other reaction(s): Unknown    MEDICATIONS: Current Outpatient Prescriptions on File Prior to Visit  Medication Sig Dispense Refill  . Aspirin-Acetaminophen-Caffeine (EXCEDRIN MIGRAINE PO) Take 1 tablet by mouth daily as needed.    . baclofen (LIORESAL) 10 MG tablet TAKE 1 TABLET BY MOUTH THREE TIMES DAILY 30 tablet 0  . clobetasol (TEMOVATE) 0.05 % external solution Apply 1 application topically 2 (two) times daily.    . clobetasol ointment (TEMOVATE) [id number] % Apply 1 application topically 2 (two) times daily.    Marland Kitchen ezetimibe (ZETIA) 10 MG tablet TAKE 1 TABLET BY MOUTH ONCE DAILY.    Marland Kitchen levothyroxine (SYNTHROID, LEVOTHROID) 200 MCG tablet TAKE 1 TABLET BY MOUTH AS DIRECTED. TAKE ONE TABLET DAILY WITH 25MCG TO EQUAL 225MCG    . levothyroxine (SYNTHROID, LEVOTHROID) 25 MCG tablet TAKE 1 TABLET BY MOUTH AS DIRECTED. TAKE ONE TABLET WITH 200MCG DAILY TO EQUAL 225MCG    . naproxen sodium (ANAPROX) 220 MG tablet Take 220 mg by mouth daily as needed.    . pantoprazole (PROTONIX) 40 MG tablet TAKE ONE TABLET BY MOUTH ONCE DAILY    . Vitamin D, Ergocalciferol, (DRISDOL) 50000 units CAPS capsule Take 1 capsule (50,000 Units total) by mouth every 7 (seven) days. 4 capsule 0   No current facility-administered medications on file prior to visit.     PAST  MEDICAL HISTORY: Past Medical History:  Diagnosis Date  . Back pain   . Depression   . Fatty liver   . GERD (gastroesophageal reflux disease)   . Hashimoto's thyroiditis   . HTN (hypertension)   . Hyperlipidemia   . Hypothyroidism   . IBS (irritable bowel syndrome)   . Joint pain   . Lactose intolerance   . Lichen sclerosus   . Obesity   . Psoriasis   . Sleep apnea     PAST SURGICAL HISTORY: Past Surgical History:  Procedure Laterality Date  . ANTERIOR DISC ARTHROPLASTY     2004    SOCIAL HISTORY: Social History  Substance Use Topics  .  Smoking status: Never Smoker  . Smokeless tobacco: Never Used  . Alcohol use Not on file    FAMILY HISTORY: Family History  Problem Relation Age of Onset  . Hypertension Mother   . Hyperlipidemia Mother   . Hypertension Father     ROS: Review of Systems  Constitutional: Positive for weight loss.  Respiratory: Negative for shortness of breath (on exertion).   Cardiovascular: Negative for chest pain.  Genitourinary: Negative for frequency.  Neurological:       Lightheaded  Endo/Heme/Allergies: Negative for polydipsia.    PHYSICAL EXAM: Blood pressure 97/67, pulse 88, temperature 97.8 F (36.6 C), temperature source Oral, resp. rate 16, height 5\' 3"  (1.6 m), weight 213 lb (96.6 kg), SpO2 98 %. Body mass index is 37.73 kg/m. Physical Exam  Constitutional: She is oriented to person, place, and time. She appears well-developed and well-nourished.  Pulmonary/Chest: Effort normal.  Musculoskeletal: Normal range of motion.  Neurological: She is oriented to person, place, and time.  Skin: Skin is warm and dry.  Psychiatric: She has a normal mood and affect. Her behavior is normal.  Vitals reviewed.   RECENT LABS AND TESTS: BMET    Component Value Date/Time   NA 137 08/10/2016 1556   K 4.0 08/10/2016 1556   CL 95 (L) 08/10/2016 1556   CO2 24 08/10/2016 1556   GLUCOSE 91 08/10/2016 1556   BUN 17 08/10/2016 1556   CREATININE 0.86 08/10/2016 1556   CALCIUM 10.2 08/10/2016 1556   GFRNONAA 76 08/10/2016 1556   GFRAA 87 08/10/2016 1556   Lab Results  Component Value Date   HGBA1C 5.3 08/10/2016   Lab Results  Component Value Date   INSULIN 38.9 (H) 08/10/2016   CBC    Component Value Date/Time   WBC 9.0 08/10/2016 1556   RBC 4.93 08/10/2016 1556   HCT 41.3 08/10/2016 1556   MCV 84 08/10/2016 1556   MCH 27.8 08/10/2016 1556   MCHC 33.2 08/10/2016 1556   RDW 14.0 08/10/2016 1556   LYMPHSABS 2.1 08/10/2016 1556   EOSABS 0.2 08/10/2016 1556   BASOSABS 0.1  08/10/2016 1556   Iron/TIBC/Ferritin/ %Sat No results found for: IRON, TIBC, FERRITIN, IRONPCTSAT Lipid Panel     Component Value Date/Time   CHOL 221 (H) 08/10/2016 1556   TRIG 143 08/10/2016 1556   HDL 73 08/10/2016 1556   LDLCALC 119 (H) 08/10/2016 1556   Hepatic Function Panel     Component Value Date/Time   PROT 7.9 08/10/2016 1556   ALBUMIN 4.7 08/10/2016 1556   AST 42 (H) 08/10/2016 1556   ALT 44 (H) 08/10/2016 1556   ALKPHOS 83 08/10/2016 1556   BILITOT 0.7 08/10/2016 1556      Component Value Date/Time   TSH 1.170 08/10/2016 1556    ASSESSMENT AND PLAN:  Essential hypertension - Plan: lisinopril (PRINIVIL,ZESTRIL) 10 MG tablet  Insulin resistance  Gastroesophageal reflux disease, esophagitis presence not specified  At risk for diabetes mellitus  Class 2 obesity without serious comorbidity with body mass index (BMI) of 37.0 to 37.9 in adult, unspecified obesity type  PLAN:  Hypertension We discussed sodium restriction, working on healthy weight loss, and a regular exercise program as the means to achieve improved blood pressure control. Ewelina agreed with this plan and agreed to follow up as directed. We will continue to monitor her blood pressure as well as her progress with the above lifestyle modifications. She agrees to  discontinue Lisinopril/HCTZ and start to take Lisinopril 10 mg every morning #30 with no refills and will watch for signs of hypotension as she continues her lifestyle modifications.  GERD Itza agrees to decrease Protonix to 40 mg every other day and watch for symptoms.  Insulin Resistance Jarielis will continue to work on weight loss, exercise, and decreasing simple carbohydrates in her diet to help decrease the risk of diabetes. She was informed that eating too many simple carbohydrates or too many calories at one sitting increases the likelihood of GI side effects. Cindel agrees to discontinue Metformin and continue with diet and exercise.  Dondria agreed to follow up with [place] as directed to monitor her progress.  Diabetes risk counselling Kenzie was given extended (at least 15 minutes) diabetes prevention counseling today. She is 56 y.o. female and has risk factors for diabetes including obesity. We discussed intensive lifestyle modifications today with an emphasis on weight loss as well as increasing exercise and decreasing simple carbohydrates in her diet.  Obesity Breanda is currently in the action stage of change. As such, her goal is to continue with weight loss efforts She has agreed to keep a food journal with 1200 to 1400 calories and 75 grams of protein daily Rainee has been instructed to work up to a goal of 150 minutes of combined cardio and strengthening exercise per week for weight loss and overall health benefits. We discussed the following Behavioral Modification Stratagies today: increasing lean protein intake  Pasleigh has agreed to follow up with our clinic in 2 weeks. She was informed of the importance of frequent follow up visits to maximize her success with intensive lifestyle modifications for her multiple health conditions.  I, Doreene Nest, am acting as scribe for Dennard Nip, MD  I have reviewed the above documentation for accuracy and completeness, and I agree with the above. -Dennard Nip, MD

## 2016-10-19 ENCOUNTER — Ambulatory Visit: Payer: Self-pay | Admitting: Physician Assistant

## 2016-10-19 ENCOUNTER — Encounter: Payer: Self-pay | Admitting: Physician Assistant

## 2016-10-19 VITALS — BP 120/80 | HR 93 | Temp 98.0°F

## 2016-10-19 DIAGNOSIS — R319 Hematuria, unspecified: Secondary | ICD-10-CM

## 2016-10-19 DIAGNOSIS — R300 Dysuria: Secondary | ICD-10-CM

## 2016-10-19 DIAGNOSIS — N390 Urinary tract infection, site not specified: Secondary | ICD-10-CM

## 2016-10-19 LAB — POCT URINALYSIS DIPSTICK
Ketones, UA: NEGATIVE
Nitrite, UA: POSITIVE
SPEC GRAV UA: 1.025
UROBILINOGEN UA: 2
pH, UA: 5

## 2016-10-19 MED ORDER — FLUCONAZOLE 150 MG PO TABS: 150.0000 mg | ORAL_TABLET | Freq: Once | ORAL | 0 refills | Status: AC

## 2016-10-19 MED ORDER — CIPROFLOXACIN HCL 250 MG PO TABS: 250.0000 mg | ORAL_TABLET | Freq: Two times a day (BID) | ORAL | 0 refills | Status: DC

## 2016-10-19 NOTE — Progress Notes (Signed)
S:  C/o uti sx for 2 days, burning, urgency, frequency, denies vaginal discharge, abdominal pain or flank pain: states she is going through menopause and has noticed changes, sex is painful;  Remainder ros neg  O:  Vitals wnl, nad, ua 3+ leuks, nitrites +, blood 2+  A: uti  P: cipro 250mg  bid x 7d, diflucan, f/u with gyn to discuss vaginal atrophy; increase water intake, add cranberry juice, return if not improving in 2 -3 days, return earlier if worsening, discussed pyelonephritis sx

## 2016-10-25 ENCOUNTER — Ambulatory Visit (INDEPENDENT_AMBULATORY_CARE_PROVIDER_SITE_OTHER): Payer: 59 | Admitting: Family Medicine

## 2016-10-25 VITALS — BP 133/97 | HR 87 | Temp 97.7°F | Ht 63.0 in | Wt 214.0 lb

## 2016-10-25 DIAGNOSIS — E559 Vitamin D deficiency, unspecified: Secondary | ICD-10-CM

## 2016-10-25 DIAGNOSIS — Z6837 Body mass index (BMI) 37.0-37.9, adult: Secondary | ICD-10-CM | POA: Diagnosis not present

## 2016-10-25 DIAGNOSIS — R7303 Prediabetes: Secondary | ICD-10-CM | POA: Diagnosis not present

## 2016-10-25 DIAGNOSIS — I10 Essential (primary) hypertension: Secondary | ICD-10-CM | POA: Diagnosis not present

## 2016-10-25 DIAGNOSIS — E669 Obesity, unspecified: Secondary | ICD-10-CM | POA: Diagnosis not present

## 2016-10-25 MED ORDER — VITAMIN D (ERGOCALCIFEROL) 1.25 MG (50000 UNIT) PO CAPS: 50000.0000 [IU] | ORAL_CAPSULE | ORAL | 0 refills | Status: DC

## 2016-10-25 MED ORDER — METFORMIN HCL 500 MG PO TABS: 500.0000 mg | ORAL_TABLET | Freq: Every morning | ORAL | 0 refills | Status: DC

## 2016-10-25 NOTE — Progress Notes (Signed)
Office: [phone]  /  Fax: [phone]   HPI:   Chief Complaint: OBESITY Katherine Smith is here to discuss her progress with her obesity treatment plan. She is following her eating plan approximately 70 % of the time and states she is exercising 0 minutes 0 times per week. Katherine Smith is currently struggling with an increase in stress and increase in comfort eating. She has increased eating out , ready to get back on track.   Her weight is 214 lb (97.1 kg) today and has not lost weight at this time since her last visit. She has lost 12 lbs since starting treatment with [place].  Hypertension Katherine Smith is a 56 y.o. female with hypertension.  Katherine Smith denies chest pain or shortness of breath on exertion. She is working weight loss to help control her blood pressure with the goal of decreasing her risk of heart attack and stroke. Katherine Smith blood pressure is currently controlled. Her blood pressure was elevated off HCTZ but she has been sick recently. Katherine Smith blood pressure has been stable last week while off of the HCTZ.   Vitamin D deficiency Katherine Smith has a diagnosis of vitamin D deficiency. She is currently taking vit D and denies nausea, vomiting or muscle weakness.  Pre-Diabetes Katherine Smith has a diagnosis of prediabetes based on her elevated HgA1c and was informed this puts her at greater risk of developing diabetes. She discontinued the metformin and did note a increase in polyphagia. She will continue to work on diet and exercise to decrease risk of diabetes. She denies nausea or hypoglycemia.    Wt Readings from Last 500 Encounters:  10/25/16 214 lb (97.1 kg)  10/10/16 213 lb (96.6 kg)  09/26/16 219 lb (99.3 kg)  09/07/16 225 lb (102.1 kg)  08/24/16 230 lb (104.3 kg)  08/10/16 240 lb (108.9 kg)     ALLERGIES: Allergies  Allergen Reactions  . Atorvastatin     Other reaction(s): Muscle Pain  . Dexfenfluramine     Other reaction(s): Unknown    MEDICATIONS: Current Outpatient  Prescriptions on File Prior to Visit  Medication Sig Dispense Refill  . Aspirin-Acetaminophen-Caffeine (EXCEDRIN MIGRAINE PO) Take 1 tablet by mouth daily as needed.    . ciprofloxacin (CIPRO) 250 MG tablet Take 1 tablet (250 mg total) by mouth 2 (two) times daily. 14 tablet 0  . clobetasol (TEMOVATE) 0.05 % external solution Apply 1 application topically 2 (two) times daily.    . clobetasol ointment (TEMOVATE) 7.16 % Apply 1 application topically 2 (two) times daily.    Marland Kitchen ezetimibe (ZETIA) 10 MG tablet TAKE 1 TABLET BY MOUTH ONCE DAILY.    Marland Kitchen levothyroxine (SYNTHROID, LEVOTHROID) 200 MCG tablet TAKE 1 TABLET BY MOUTH AS DIRECTED. TAKE ONE TABLET DAILY WITH 25MCG TO EQUAL 225MCG    . levothyroxine (SYNTHROID, LEVOTHROID) 25 MCG tablet TAKE 1 TABLET BY MOUTH AS DIRECTED. TAKE ONE TABLET WITH 200MCG DAILY TO EQUAL 225MCG    . lisinopril (PRINIVIL,ZESTRIL) 10 MG tablet Take 1 tablet (10 mg total) by mouth daily. 30 tablet 0  . naproxen sodium (ANAPROX) 220 MG tablet Take 220 mg by mouth daily as needed.    . pantoprazole (PROTONIX) 40 MG tablet TAKE ONE TABLET BY MOUTH ONCE DAILY     No current facility-administered medications on file prior to visit.     PAST MEDICAL HISTORY: Past Medical History:  Diagnosis Date  . Back pain   . Depression   . Fatty liver   . GERD (gastroesophageal reflux  disease)   . Hashimoto's thyroiditis   . HTN (hypertension)   . Hyperlipidemia   . Hypothyroidism   . IBS (irritable bowel syndrome)   . Joint pain   . Lactose intolerance   . Lichen sclerosus   . Obesity   . Psoriasis   . Sleep apnea     PAST SURGICAL HISTORY: Past Surgical History:  Procedure Laterality Date  . ANTERIOR DISC ARTHROPLASTY     2004    SOCIAL HISTORY: Social History  Substance Use Topics  . Smoking status: Never Smoker  . Smokeless tobacco: Never Used  . Alcohol use Not on file    FAMILY HISTORY: Family History  Problem Relation Age of Onset  . Hypertension Mother    . Hyperlipidemia Mother   . Hypertension Father     ROS: Review of Systems  Constitutional: Negative for weight loss.  Endo/Heme/Allergies:       Polyphagia  All other systems reviewed and are negative.   PHYSICAL EXAM: Blood pressure (!) 133/97, pulse 87, temperature 97.7 F (36.5 C), temperature source Oral, height 5\' 3"  (1.6 m), weight 214 lb (97.1 kg), SpO2 98 %. Body mass index is 37.91 kg/m. Physical Exam  Constitutional: She is oriented to person, place, and time. She appears well-developed and well-nourished.  HENT:  Head: Normocephalic and atraumatic.  Eyes: Pupils are equal, round, and reactive to light.  Neck: Normal range of motion.  Cardiovascular: Normal rate.   Pulmonary/Chest: Effort normal.  Abdominal: Soft.  Musculoskeletal: Normal range of motion.  Neurological: She is alert and oriented to person, place, and time.  Skin: Skin is warm and dry.  Psychiatric: She has a normal mood and affect.  Vitals reviewed.   RECENT LABS AND TESTS: BMET    Component Value Date/Time   NA 137 08/10/2016 1556   K 4.0 08/10/2016 1556   CL 95 (L) 08/10/2016 1556   CO2 24 08/10/2016 1556   GLUCOSE 91 08/10/2016 1556   BUN 17 08/10/2016 1556   CREATININE 0.86 08/10/2016 1556   CALCIUM 10.2 08/10/2016 1556   GFRNONAA 76 08/10/2016 1556   GFRAA 87 08/10/2016 1556   Lab Results  Component Value Date   HGBA1C 5.3 08/10/2016   Lab Results  Component Value Date   INSULIN 38.9 (H) 08/10/2016   CBC    Component Value Date/Time   WBC 9.0 08/10/2016 1556   RBC 4.93 08/10/2016 1556   HCT 41.3 08/10/2016 1556   MCV 84 08/10/2016 1556   MCH 27.8 08/10/2016 1556   MCHC 33.2 08/10/2016 1556   RDW 14.0 08/10/2016 1556   LYMPHSABS 2.1 08/10/2016 1556   EOSABS 0.2 08/10/2016 1556   BASOSABS 0.1 08/10/2016 1556   Iron/TIBC/Ferritin/ %Sat No results found for: IRON, TIBC, FERRITIN, IRONPCTSAT Lipid Panel     Component Value Date/Time   CHOL 221 (H) 08/10/2016 1556    TRIG 143 08/10/2016 1556   HDL 73 08/10/2016 1556   LDLCALC 119 (H) 08/10/2016 1556   Hepatic Function Panel     Component Value Date/Time   PROT 7.9 08/10/2016 1556   ALBUMIN 4.7 08/10/2016 1556   AST 42 (H) 08/10/2016 1556   ALT 44 (H) 08/10/2016 1556   ALKPHOS 83 08/10/2016 1556   BILITOT 0.7 08/10/2016 1556      Component Value Date/Time   TSH 1.170 08/10/2016 1556    ASSESSMENT AND PLAN: Essential hypertension  Vitamin D deficiency - Plan: Vitamin D, Ergocalciferol, (DRISDOL) 50000 units CAPS capsule  Prediabetes -  Plan: metFORMIN (GLUCOPHAGE) 500 MG tablet  Class 2 obesity without serious comorbidity with body mass index (BMI) of 37.0 to 37.9 in adult, unspecified obesity type  PLAN: Hypertension We discussed sodium restriction, working on healthy weight loss, and a regular exercise program as the means to achieve improved blood pressure control. Katherine Smith agreed with this plan and agreed to follow up as directed. We will continue to monitor her blood pressure as well as her progress with the above lifestyle modifications. She will continue her medications as prescribed and will watch for signs of hypotension as she continues her lifestyle modifications. Will recheck her blood pressure in 2 weeks.   Vitamin D Deficiency Katherine Smith was informed that low vitamin D levels contributes to fatigue and are associated with obesity, breast, and colon cancer. She agrees to continue to take prescription Vit D @50 ,000 IU every week in which a prescription was sent in to her pharmacy today and will follow up for routine testing of vitamin D, at least 2-3 times per year. She was informed of the risk of over-replacement of vitamin D and agrees to not increase her dose unless he discusses this with [place] first.  Pre-Diabetes Katherine Smith will continue to work on weight loss, exercise, and decreasing simple carbohydrates in her diet to help decrease the risk of diabetes. We dicussed metformin including  benefits and risks. She was informed that eating too many simple carbohydrates or too many calories at one sitting increases the likelihood of GI side effects. Katherine Smith requested metformin for now and a prescription was written today. Katherine Smith agreed to follow up with [place] as directed to monitor her progress.  Obesity Katherine Smith is currently in the action stage of change. As such, her goal is to continue with weight loss efforts She has agreed to follow the Category 2 plan Katherine Smith has been instructed to work up to a goal of 150 minutes of combined cardio and strengthening exercise per week for weight loss and overall health benefits. We discussed the following Behavioral Modification Stratagies today: increasing lean protein intake, decreasing simple carbohydrates  and increasing lower sugar fruits  Katherine Smith has agreed to follow up with our clinic in 2 weeks. She was informed of the importance of frequent follow up visits to maximize her success with intensive lifestyle modifications for her multiple health conditions.  I, April Moore , am acting as Education administrator for Dennard Nip, MD  I have reviewed the above documentation for accuracy and completeness, and I agree with the above. -Dennard Nip, MD

## 2016-11-08 ENCOUNTER — Ambulatory Visit (INDEPENDENT_AMBULATORY_CARE_PROVIDER_SITE_OTHER): Payer: 59 | Admitting: Family Medicine

## 2016-11-08 VITALS — BP 109/73 | HR 102 | Temp 98.1°F | Ht 63.0 in | Wt 211.0 lb

## 2016-11-08 DIAGNOSIS — Z6837 Body mass index (BMI) 37.0-37.9, adult: Secondary | ICD-10-CM | POA: Diagnosis not present

## 2016-11-08 DIAGNOSIS — I10 Essential (primary) hypertension: Secondary | ICD-10-CM

## 2016-11-08 DIAGNOSIS — E669 Obesity, unspecified: Secondary | ICD-10-CM | POA: Diagnosis not present

## 2016-11-08 NOTE — Progress Notes (Signed)
Office: [phone]  /  Fax: [phone]   HPI:   Chief Complaint: OBESITY Katherine Smith is here to discuss her progress with her obesity treatment plan. She is following her eating plan approximately 85 % of the time and states she is exercising 8,000 steps 7 times per week. Katherine Smith is doing well with weight loss, sometimes journaling and sometimes following category 2 plan. Hunger is controlled but still struggling with some cravings.  Her weight is 211 lb (95.7 kg) today and has had a weight loss of 3 pounds over a period of 2 weeks since her last visit. She has lost 29 lbs since starting treatment with [place].  Hypertension Katherine Smith is a 56 y.o. female with hypertension. Katherine Smith stopped HCTZ due to decreased blood pressure while losing weight, had a higher blood pressure last visit but is now at low end of normal off HCTZ. Katherine Smith denies chest pain, headache, lightheadedness or shortness of breath on exertion. She is working weight loss to help control her blood pressure with the goal of decreasing her risk of heart attack and stroke. Katherine Smith blood pressure is currently controlled.    Wt Readings from Last 500 Encounters:  11/08/16 211 lb (95.7 kg)  10/25/16 214 lb (97.1 kg)  10/10/16 213 lb (96.6 kg)  09/26/16 219 lb (99.3 kg)  09/07/16 225 lb (102.1 kg)  08/24/16 230 lb (104.3 kg)  08/10/16 240 lb (108.9 kg)     ALLERGIES: Allergies  Allergen Reactions  . Atorvastatin     Other reaction(s): Muscle Pain  . Dexfenfluramine     Other reaction(s): Unknown    MEDICATIONS: Current Outpatient Prescriptions on File Prior to Visit  Medication Sig Dispense Refill  . Aspirin-Acetaminophen-Caffeine (EXCEDRIN MIGRAINE PO) Take 1 tablet by mouth daily as needed.    . clobetasol (TEMOVATE) 0.05 % external solution Apply 1 application topically 2 (two) times daily.    . clobetasol ointment (TEMOVATE) 6.43 % Apply 1 application topically 2 (two) times daily.    Marland Kitchen ezetimibe  (ZETIA) 10 MG tablet TAKE 1 TABLET BY MOUTH ONCE DAILY.    Marland Kitchen levothyroxine (SYNTHROID, LEVOTHROID) 200 MCG tablet TAKE 1 TABLET BY MOUTH AS DIRECTED. TAKE ONE TABLET DAILY WITH 25MCG TO EQUAL 225MCG    . levothyroxine (SYNTHROID, LEVOTHROID) 25 MCG tablet TAKE 1 TABLET BY MOUTH AS DIRECTED. TAKE ONE TABLET WITH 200MCG DAILY TO EQUAL 225MCG    . lisinopril (PRINIVIL,ZESTRIL) 10 MG tablet Take 1 tablet (10 mg total) by mouth daily. 30 tablet 0  . metFORMIN (GLUCOPHAGE) 500 MG tablet Take 1 tablet (500 mg total) by mouth every morning. 30 tablet 0  . naproxen sodium (ANAPROX) 220 MG tablet Take 220 mg by mouth daily as needed.    . pantoprazole (PROTONIX) 40 MG tablet TAKE ONE TABLET BY MOUTH ONCE DAILY    . Vitamin D, Ergocalciferol, (DRISDOL) 50000 units CAPS capsule Take 1 capsule (50,000 Units total) by mouth every 7 (seven) days. 4 capsule 0   No current facility-administered medications on file prior to visit.     PAST MEDICAL HISTORY: Past Medical History:  Diagnosis Date  . Back pain   . Depression   . Fatty liver   . GERD (gastroesophageal reflux disease)   . Hashimoto's thyroiditis   . HTN (hypertension)   . Hyperlipidemia   . Hypothyroidism   . IBS (irritable bowel syndrome)   . Joint pain   . Lactose intolerance   . Lichen sclerosus   . Obesity   .  Psoriasis   . Sleep apnea     PAST SURGICAL HISTORY: Past Surgical History:  Procedure Laterality Date  . ANTERIOR DISC ARTHROPLASTY     2004    SOCIAL HISTORY: Social History  Substance Use Topics  . Smoking status: Never Smoker  . Smokeless tobacco: Never Used  . Alcohol use Not on file    FAMILY HISTORY: Family History  Problem Relation Age of Onset  . Hypertension Mother   . Hyperlipidemia Mother   . Hypertension Father     ROS: Review of Systems  Constitutional: Positive for weight loss.  Respiratory: Negative for shortness of breath (with exertion).   Cardiovascular: Negative for chest pain.        Negative lightheadedness  Neurological: Negative for headaches.    PHYSICAL EXAM: Blood pressure 109/73, pulse (!) 102, temperature 98.1 F (36.7 C), temperature source Oral, height 5\' 3"  (1.6 m), weight 211 lb (95.7 kg), SpO2 98 %. Body mass index is 37.38 kg/m. Physical Exam  Constitutional: She is oriented to person, place, and time. She appears well-developed and well-nourished.  Cardiovascular: Normal rate.   Pulmonary/Chest: Effort normal.  Musculoskeletal: Normal range of motion.  Neurological: She is oriented to person, place, and time.  Skin: Skin is warm and dry.  Psychiatric: She has a normal mood and affect. Her behavior is normal.  Vitals reviewed.   RECENT LABS AND TESTS: BMET    Component Value Date/Time   NA 137 08/10/2016 1556   K 4.0 08/10/2016 1556   CL 95 (L) 08/10/2016 1556   CO2 24 08/10/2016 1556   GLUCOSE 91 08/10/2016 1556   BUN 17 08/10/2016 1556   CREATININE 0.86 08/10/2016 1556   CALCIUM 10.2 08/10/2016 1556   GFRNONAA 76 08/10/2016 1556   GFRAA 87 08/10/2016 1556   Lab Results  Component Value Date   HGBA1C 5.3 08/10/2016   Lab Results  Component Value Date   INSULIN 38.9 (H) 08/10/2016   CBC    Component Value Date/Time   WBC 9.0 08/10/2016 1556   RBC 4.93 08/10/2016 1556   HCT 41.3 08/10/2016 1556   MCV 84 08/10/2016 1556   MCH 27.8 08/10/2016 1556   MCHC 33.2 08/10/2016 1556   RDW 14.0 08/10/2016 1556   LYMPHSABS 2.1 08/10/2016 1556   EOSABS 0.2 08/10/2016 1556   BASOSABS 0.1 08/10/2016 1556   Iron/TIBC/Ferritin/ %Sat No results found for: IRON, TIBC, FERRITIN, IRONPCTSAT Lipid Panel     Component Value Date/Time   CHOL 221 (H) 08/10/2016 1556   TRIG 143 08/10/2016 1556   HDL 73 08/10/2016 1556   LDLCALC 119 (H) 08/10/2016 1556   Hepatic Function Panel     Component Value Date/Time   PROT 7.9 08/10/2016 1556   ALBUMIN 4.7 08/10/2016 1556   AST 42 (H) 08/10/2016 1556   ALT 44 (H) 08/10/2016 1556   ALKPHOS 83  08/10/2016 1556   BILITOT 0.7 08/10/2016 1556      Component Value Date/Time   TSH 1.170 08/10/2016 1556    ASSESSMENT AND PLAN: Essential hypertension  Class 2 obesity without serious comorbidity with body mass index (BMI) of 37.0 to 37.9 in adult, unspecified obesity type  PLAN:  Hypertension We discussed sodium restriction, working on healthy weight loss, and a regular exercise program as the means to achieve improved blood pressure control. Katherine Smith agreed with this plan and agreed to follow up as directed. We will continue to monitor her blood pressure as well as her progress with the above  lifestyle modifications. She will continue her medications as prescribed and will watch for signs of hypotension as she continues her lifestyle modifications.  We spent > than 50% of the 15 minute visit on the counseling as documented in the note.  Obesity Katherine Smith is currently in the action stage of change. As such, her goal is to continue with weight loss efforts She has agreed to keep a food journal with 1200 to 1400 calories and 75+ grams of protein daily Katherine Smith has been instructed to work up to a goal of 150 minutes of combined cardio and strengthening exercise per week for weight loss and overall health benefits. We discussed the following Behavioral Modification Stratagies today: increasing lean protein intake, increasing lower sugar fruits, increasing water and decreasing sodium intake  Katherine Smith has agreed to follow up with our clinic in 2 weeks. She was informed of the importance of frequent follow up visits to maximize her success with intensive lifestyle modifications for her multiple health conditions. We will check labs at next visit.  I, Doreene Nest, am acting as scribe for Dennard Nip, MD  I have reviewed the above documentation for accuracy and completeness, and I agree with the above. -Dennard Nip, MD

## 2016-11-23 ENCOUNTER — Ambulatory Visit (INDEPENDENT_AMBULATORY_CARE_PROVIDER_SITE_OTHER): Payer: 59 | Admitting: Family Medicine

## 2016-11-23 VITALS — BP 105/72 | HR 84 | Temp 97.7°F | Ht 63.0 in | Wt 211.0 lb

## 2016-11-23 DIAGNOSIS — Z6837 Body mass index (BMI) 37.0-37.9, adult: Secondary | ICD-10-CM

## 2016-11-23 DIAGNOSIS — E559 Vitamin D deficiency, unspecified: Secondary | ICD-10-CM

## 2016-11-23 DIAGNOSIS — E538 Deficiency of other specified B group vitamins: Secondary | ICD-10-CM | POA: Diagnosis not present

## 2016-11-23 DIAGNOSIS — E66812 Obesity, class 2: Secondary | ICD-10-CM

## 2016-11-23 DIAGNOSIS — Z9189 Other specified personal risk factors, not elsewhere classified: Secondary | ICD-10-CM | POA: Diagnosis not present

## 2016-11-23 DIAGNOSIS — E669 Obesity, unspecified: Secondary | ICD-10-CM | POA: Diagnosis not present

## 2016-11-23 DIAGNOSIS — R7989 Other specified abnormal findings of blood chemistry: Secondary | ICD-10-CM

## 2016-11-23 DIAGNOSIS — R7303 Prediabetes: Secondary | ICD-10-CM

## 2016-11-23 DIAGNOSIS — R945 Abnormal results of liver function studies: Principal | ICD-10-CM

## 2016-11-23 MED ORDER — METFORMIN HCL 500 MG PO TABS: 500.0000 mg | ORAL_TABLET | Freq: Every morning | ORAL | 0 refills | Status: DC

## 2016-11-23 MED ORDER — VITAMIN D (ERGOCALCIFEROL) 1.25 MG (50000 UNIT) PO CAPS: 50000.0000 [IU] | ORAL_CAPSULE | ORAL | 0 refills | Status: DC

## 2016-11-23 NOTE — Progress Notes (Signed)
Office: [phone]  /  Fax: [phone]   HPI:   Chief Complaint: OBESITY Katherine Smith is here to discuss her progress with her obesity treatment plan. She is following her eating plan approximately 75 % of the time and states she is exercising 8,000 steps per day 7 times per week. [name] hasn't been able to concentrate on weight loss with recent decline of her parents health and increased travel and stress. She did a good job maintaining her weight in the last 2 weeks. Her weight is 211 lb (95.7 kg) today and has maintained weight over a period of 2 weeks since her last visit. She has lost 29 lbs since starting treatment with [place].  Vitamin D deficiency [name] has a diagnosis of vitamin D deficiency. She is currently taking vit D, last level not at goal and denies nausea, vomiting or muscle weakness. [name] is due for labs.  Pre-Diabetes [name] has a diagnosis of prediabetes based on her elevated Hgb A1c and was informed this puts her at greater risk of developing diabetes. She is taking metformin currently and continues to work on diet and exercise to decrease risk of diabetes. She denies nausea, vomiting or hypoglycemia.  B12 Deficiency [name] has a diagnosis of B12 insufficiency and notes fatigue. This is not a new diagnosis. [name] is not a vegetarian and does not have a previous diagnosis of pernicious anemia. She does not have a history of weight loss surgery.   Elevated LFT [name] has a new dx of elevated ALT. Her BMI is over 40. She denies abdominal pain or jaundice and has never been told of any liver problems in the past. She denies excessive alcohol intake.  At risk for diabetes [name] is at higher than average risk for developing diabetes due to her obesity and pre-diabetes. She currently denies polyuria or polydipsia.   Wt Readings from Last 500 Encounters:  11/23/16 211 lb (95.7 kg)  11/08/16 211 lb (95.7 kg)  10/25/16 214 lb (97.1 kg)  10/10/16 213 lb (96.6 kg)  09/26/16  219 lb (99.3 kg)  09/07/16 225 lb (102.1 kg)  08/24/16 230 lb (104.3 kg)  08/10/16 240 lb (108.9 kg)     ALLERGIES: Allergies  Allergen Reactions   Atorvastatin     Other reaction(s): Muscle Pain   Dexfenfluramine     Other reaction(s): Unknown    MEDICATIONS: Current Outpatient Prescriptions on File Prior to Visit  Medication Sig Dispense Refill   Aspirin-Acetaminophen-Caffeine (EXCEDRIN MIGRAINE PO) Take 1 tablet by mouth daily as needed.     clobetasol (TEMOVATE) 0.05 % external solution Apply 1 application topically 2 (two) times daily.     clobetasol ointment (TEMOVATE) 4.03 % Apply 1 application topically 2 (two) times daily.     ezetimibe (ZETIA) 10 MG tablet TAKE 1 TABLET BY MOUTH ONCE DAILY.     levothyroxine (SYNTHROID, LEVOTHROID) 200 MCG tablet TAKE 1 TABLET BY MOUTH AS DIRECTED. TAKE ONE TABLET DAILY WITH 25MCG TO EQUAL 225MCG     levothyroxine (SYNTHROID, LEVOTHROID) 25 MCG tablet TAKE 1 TABLET BY MOUTH AS DIRECTED. TAKE ONE TABLET WITH 200MCG DAILY TO EQUAL 225MCG     lisinopril (PRINIVIL,ZESTRIL) 10 MG tablet Take 1 tablet (10 mg total) by mouth daily. 30 tablet 0   naproxen sodium (ANAPROX) 220 MG tablet Take 220 mg by mouth daily as needed.     pantoprazole (PROTONIX) 40 MG tablet TAKE ONE TABLET BY MOUTH ONCE DAILY     No current facility-administered medications on file prior  to visit.     PAST MEDICAL HISTORY: Past Medical History:  Diagnosis Date   Back pain    Depression    Fatty liver    GERD (gastroesophageal reflux disease)    Hashimoto's thyroiditis    HTN (hypertension)    Hyperlipidemia    Hypothyroidism    IBS (irritable bowel syndrome)    Joint pain    Lactose intolerance    Lichen sclerosus    Obesity    Psoriasis    Sleep apnea     PAST SURGICAL HISTORY: Past Surgical History:  Procedure Laterality Date   ANTERIOR DISC ARTHROPLASTY     2004    SOCIAL HISTORY: Social History  Substance Use Topics     Smoking status: Never Smoker   Smokeless tobacco: Never Used   Alcohol use Not on file    FAMILY HISTORY: Family History  Problem Relation Age of Onset   Hypertension Mother    Hyperlipidemia Mother    Hypertension Father     ROS: Review of Systems  Constitutional: Positive for malaise/fatigue. Negative for weight loss.  Gastrointestinal: Negative for abdominal pain, nausea and vomiting.       Negative jaundice  Genitourinary: Negative for frequency.  Musculoskeletal:       Negative muscle weakness  Endo/Heme/Allergies: Negative for polydipsia.       Negative hypoglycemia    PHYSICAL EXAM: Blood pressure 105/72, pulse 84, temperature 97.7 F (36.5 C), temperature source Oral, height 5\' 3"  (1.6 m), weight 211 lb (95.7 kg), SpO2 97 %. Body mass index is 37.38 kg/m. Physical Exam  Constitutional: She is oriented to person, place, and time. She appears well-developed and well-nourished.  Cardiovascular: Normal rate.   Pulmonary/Chest: Effort normal.  Musculoskeletal: Normal range of motion.  Neurological: She is oriented to person, place, and time.  Skin: Skin is warm and dry.  Psychiatric: She has a normal mood and affect. Her behavior is normal.  Vitals reviewed.   RECENT LABS AND TESTS: BMET    Component Value Date/Time   NA 137 08/10/2016 1556   K 4.0 08/10/2016 1556   CL 95 (L) 08/10/2016 1556   CO2 24 08/10/2016 1556   GLUCOSE 91 08/10/2016 1556   BUN 17 08/10/2016 1556   CREATININE 0.86 08/10/2016 1556   CALCIUM 10.2 08/10/2016 1556   GFRNONAA 76 08/10/2016 1556   GFRAA 87 08/10/2016 1556   Lab Results  Component Value Date   HGBA1C 5.3 08/10/2016   Lab Results  Component Value Date   INSULIN 38.9 (H) 08/10/2016   CBC    Component Value Date/Time   WBC 9.0 08/10/2016 1556   RBC 4.93 08/10/2016 1556   HCT 41.3 08/10/2016 1556   MCV 84 08/10/2016 1556   MCH 27.8 08/10/2016 1556   MCHC 33.2 08/10/2016 1556   RDW 14.0 08/10/2016 1556    LYMPHSABS 2.1 08/10/2016 1556   EOSABS 0.2 08/10/2016 1556   BASOSABS 0.1 08/10/2016 1556   Iron/TIBC/Ferritin/ %Sat No results found for: IRON, TIBC, FERRITIN, IRONPCTSAT Lipid Panel     Component Value Date/Time   CHOL 221 (H) 08/10/2016 1556   TRIG 143 08/10/2016 1556   HDL 73 08/10/2016 1556   LDLCALC 119 (H) 08/10/2016 1556   Hepatic Function Panel     Component Value Date/Time   PROT 7.9 08/10/2016 1556   ALBUMIN 4.7 08/10/2016 1556   AST 42 (H) 08/10/2016 1556   ALT 44 (H) 08/10/2016 1556   ALKPHOS 83 08/10/2016 1556  BILITOT 0.7 08/10/2016 1556      Component Value Date/Time   TSH 1.170 08/10/2016 1556    ASSESSMENT AND PLAN: Elevated liver function tests - Plan: Comprehensive metabolic panel, Lipid Panel With LDL/HDL Ratio, CANCELED: TSH, CANCELED: T4, free, CANCELED: T3  Prediabetes - Plan: Hemoglobin A1c, Insulin, random, metFORMIN (GLUCOPHAGE) 500 MG tablet  Vitamin D deficiency - Plan: VITAMIN D 25 Hydroxy (Vit-D Deficiency, Fractures), Vitamin D, Ergocalciferol, (DRISDOL) 50000 units CAPS capsule  B12 nutritional deficiency - Plan: Vitamin B12, T3, T4, free, TSH  At risk for diabetes mellitus  Class 2 obesity without serious comorbidity with body mass index (BMI) of 37.0 to 37.9 in adult, unspecified obesity type  PLAN:  Vitamin D Deficiency Talonda was informed that low vitamin D levels contributes to fatigue and are associated with obesity, breast, and colon cancer. She agrees to continue to take prescription Vit D @50 ,000 IU every week, we will refill for 1 month and check labs and will follow up for routine testing of vitamin D, at least 2-3 times per year. She was informed of the risk of over-replacement of vitamin D and agrees to not increase her dose unless he discusses this with [place] first. She agrees to follow up with our clinic in 2 weeks.  Pre-Diabetes Kailah will continue to work on weight loss, exercise, and decreasing simple carbohydrates in  her diet to help decrease the risk of diabetes. We dicussed metformin including benefits and risks. She was informed that eating too many simple carbohydrates or too many calories at one sitting increases the likelihood of GI side effects. Atira agrees to continue to take metformin for now and a prescription was written today for 1 month refill. Oddie agreed to follow up with [place] as directed to monitor her progress.  B12 Deficiency Takeshia will work on increasing B12 rich foods in her diet. B12 supplementation was not prescribed today. We will plan on rechecking labs today and Syrah agrees to follow up with our clinic in 2 weeks.  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 Lesleyann was educated on her risk of developing NASH or even liver failure and the only proven treatment for NAFLD was weight loss. We will check labs and Kristelle agreed to continue with her weight loss efforts with healthier diet and exercise as an essential part of her treatment plan. Marian agrees to follow up with our clinic in 2 weeks.  Diabetes risk counselling Mayetta was given extended (at least 15 minutes) diabetes prevention counseling today. She is 56 y.o. female and has risk factors for diabetes including obesity. We discussed intensive lifestyle modifications today with an emphasis on weight loss as well as increasing exercise and decreasing simple carbohydrates in her diet.  Obesity Aleisha is currently in the action stage of change. As such, her goal is to continue with weight loss efforts She has agreed to keep a food journal with 1200 to 1400 calories and 75+ grams of protein daily Megham has been instructed to work up to a goal of 150 minutes of combined cardio and strengthening exercise per week for weight loss and overall health benefits. We discussed the following Behavioral Modification Stratagies today: increasing lean  protein intake, decreasing simple carbohydrates  and increasing lower sugar fruits  Beaux has agreed to follow up with our clinic in 2 weeks. She was informed of the importance of frequent follow up visits to maximize her success  with intensive lifestyle modifications for her multiple health conditions.  I, Doreene Nest, am acting as scribe for Dennard Nip, MD  I have reviewed the above documentation for accuracy and completeness, and I agree with the above. -Dennard Nip, MD

## 2016-11-24 LAB — COMPREHENSIVE METABOLIC PANEL
ALBUMIN: 4.4 g/dL (ref 3.5–5.5)
ALT: 23 IU/L (ref 0–32)
AST: 20 IU/L (ref 0–40)
Albumin/Globulin Ratio: 1.7 (ref 1.2–2.2)
Alkaline Phosphatase: 81 IU/L (ref 39–117)
BUN/Creatinine Ratio: 29 — ABNORMAL HIGH (ref 9–23)
BUN: 21 mg/dL (ref 6–24)
Bilirubin Total: 0.5 mg/dL (ref 0.0–1.2)
CHLORIDE: 100 mmol/L (ref 96–106)
CO2: 24 mmol/L (ref 18–29)
CREATININE: 0.73 mg/dL (ref 0.57–1.00)
Calcium: 9.8 mg/dL (ref 8.7–10.2)
GFR calc Af Amer: 106 mL/min/{1.73_m2} (ref >59)
GFR, EST NON AFRICAN AMERICAN: 92 mL/min/{1.73_m2} (ref >59)
GLOBULIN, TOTAL: 2.6 g/dL (ref 1.5–4.5)
Glucose: 102 mg/dL — ABNORMAL HIGH (ref 65–99)
POTASSIUM: 4.6 mmol/L (ref 3.5–5.2)
Sodium: 141 mmol/L (ref 134–144)
TOTAL PROTEIN: 7 g/dL (ref 6.0–8.5)

## 2016-11-24 LAB — HEMOGLOBIN A1C
ESTIMATED AVERAGE GLUCOSE: 100 mg/dL
HEMOGLOBIN A1C: 5.1 % (ref 4.8–5.6)

## 2016-11-24 LAB — LIPID PANEL WITH LDL/HDL RATIO
Cholesterol, Total: 187 mg/dL (ref 100–199)
HDL: 66 mg/dL (ref >39)
LDL CALC: 101 mg/dL — AB (ref 0–99)
LDL/HDL RATIO: 1.5 ratio (ref 0.0–3.2)
Triglycerides: 101 mg/dL (ref 0–149)
VLDL CHOLESTEROL CAL: 20 mg/dL (ref 5–40)

## 2016-11-24 LAB — T3: T3, Total: 143 ng/dL (ref 71–180)

## 2016-11-24 LAB — TSH: TSH: 0.011 u[IU]/mL — AB (ref 0.450–4.500)

## 2016-11-24 LAB — T4, FREE: FREE T4: 2.34 ng/dL — AB (ref 0.82–1.77)

## 2016-11-24 LAB — VITAMIN B12: Vitamin B-12: 234 pg/mL (ref 232–1245)

## 2016-11-24 LAB — INSULIN, RANDOM: INSULIN: 27.7 u[IU]/mL — ABNORMAL HIGH (ref 2.6–24.9)

## 2016-11-24 LAB — VITAMIN D 25 HYDROXY (VIT D DEFICIENCY, FRACTURES): VIT D 25 HYDROXY: 29.9 ng/mL — AB (ref 30.0–100.0)

## 2016-11-26 DIAGNOSIS — K644 Residual hemorrhoidal skin tags: Secondary | ICD-10-CM | POA: Diagnosis not present

## 2016-12-12 ENCOUNTER — Ambulatory Visit (INDEPENDENT_AMBULATORY_CARE_PROVIDER_SITE_OTHER): Payer: 59 | Admitting: Family Medicine

## 2017-01-02 DIAGNOSIS — Z86010 Personal history of colonic polyps: Secondary | ICD-10-CM | POA: Diagnosis not present

## 2017-01-02 DIAGNOSIS — K219 Gastro-esophageal reflux disease without esophagitis: Secondary | ICD-10-CM | POA: Diagnosis not present

## 2017-01-10 DIAGNOSIS — I10 Essential (primary) hypertension: Secondary | ICD-10-CM | POA: Diagnosis not present

## 2017-01-10 DIAGNOSIS — Z1231 Encounter for screening mammogram for malignant neoplasm of breast: Secondary | ICD-10-CM | POA: Diagnosis not present

## 2017-01-10 DIAGNOSIS — Z0000 Encounter for general adult medical examination without abnormal findings: Secondary | ICD-10-CM | POA: Diagnosis not present

## 2017-01-10 DIAGNOSIS — E782 Mixed hyperlipidemia: Secondary | ICD-10-CM | POA: Diagnosis not present

## 2017-01-11 ENCOUNTER — Other Ambulatory Visit: Payer: Self-pay | Admitting: Family Medicine

## 2017-01-11 DIAGNOSIS — Z1231 Encounter for screening mammogram for malignant neoplasm of breast: Secondary | ICD-10-CM

## 2017-01-17 ENCOUNTER — Ambulatory Visit (INDEPENDENT_AMBULATORY_CARE_PROVIDER_SITE_OTHER): Payer: 59 | Admitting: Family Medicine

## 2017-01-17 VITALS — BP 127/78 | HR 79 | Temp 97.8°F | Ht 63.0 in | Wt 213.0 lb

## 2017-01-17 DIAGNOSIS — E559 Vitamin D deficiency, unspecified: Secondary | ICD-10-CM | POA: Diagnosis not present

## 2017-01-17 DIAGNOSIS — E669 Obesity, unspecified: Secondary | ICD-10-CM | POA: Diagnosis not present

## 2017-01-17 DIAGNOSIS — Z6837 Body mass index (BMI) 37.0-37.9, adult: Secondary | ICD-10-CM | POA: Diagnosis not present

## 2017-01-17 DIAGNOSIS — R7303 Prediabetes: Secondary | ICD-10-CM | POA: Diagnosis not present

## 2017-01-17 MED ORDER — METFORMIN HCL 500 MG PO TABS: 500.0000 mg | ORAL_TABLET | Freq: Every morning | ORAL | 0 refills | Status: DC

## 2017-01-17 MED ORDER — VITAMIN D (ERGOCALCIFEROL) 1.25 MG (50000 UNIT) PO CAPS: 50000.0000 [IU] | ORAL_CAPSULE | ORAL | 0 refills | Status: DC

## 2017-01-17 NOTE — Progress Notes (Signed)
Office: [phone]  /  Fax: [phone]   HPI:   Chief Complaint: OBESITY Katherine Smith is here to discuss her progress with her obesity treatment plan. She is on the  keep a food journal with 1200 to 1400 calories and 75+ grams of protein  and is following her eating plan approximately 0 % of the time. She states she is exercising 0 minutes 0 times per week. Katherine Smith has been off track for 2 months with the death of father-in-law and moving mother-in-law into her home. Things have settled down and she is ready to get back on track. Her weight is 213 lb (96.6 kg) today and has had a weight gain of 2 pounds over a period of 8 weeks since her last visit. She has lost 27 lbs since starting treatment with [place].  Vitamin D deficiency Katherine Smith has a diagnosis of vitamin D deficiency. She is currently stable on vit D, not yet at goal and denies nausea, vomiting or muscle weakness.  Pre-Diabetes Katherine Smith has a diagnosis of pre-diabetes based on her elevated Hgb A1c and was informed this puts her at greater risk of developing diabetes. She is stable on metformin currently, A1c is very good and fasting insulin is improving. Katherine Smith continues to work on diet and exercise to decrease risk of diabetes. She denies nausea or hypoglycemia.   ALLERGIES: Allergies  Allergen Reactions  . Atorvastatin     Other reaction(s): Muscle Pain  . Dexfenfluramine     Other reaction(s): Unknown    MEDICATIONS: Current Outpatient Prescriptions on File Prior to Visit  Medication Sig Dispense Refill  . Aspirin-Acetaminophen-Caffeine (EXCEDRIN MIGRAINE PO) Take 1 tablet by mouth daily as needed.    . clobetasol (TEMOVATE) 0.05 % external solution Apply 1 application topically 2 (two) times daily.    . clobetasol ointment (TEMOVATE) 0.16 % Apply 1 application topically 2 (two) times daily.    Marland Kitchen ezetimibe (ZETIA) 10 MG tablet TAKE 1 TABLET BY MOUTH ONCE DAILY.    Marland Kitchen levothyroxine (SYNTHROID, LEVOTHROID) 200 MCG tablet TAKE 1  TABLET BY MOUTH AS DIRECTED. TAKE ONE TABLET DAILY WITH 25MCG TO EQUAL 225MCG    . levothyroxine (SYNTHROID, LEVOTHROID) 25 MCG tablet TAKE 1 TABLET BY MOUTH AS DIRECTED. TAKE ONE TABLET WITH 200MCG DAILY TO EQUAL 225MCG    . lisinopril (PRINIVIL,ZESTRIL) 10 MG tablet Take 1 tablet (10 mg total) by mouth daily. 30 tablet 0  . naproxen sodium (ANAPROX) 220 MG tablet Take 220 mg by mouth daily as needed.    . pantoprazole (PROTONIX) 40 MG tablet TAKE ONE TABLET BY MOUTH ONCE DAILY     No current facility-administered medications on file prior to visit.     PAST MEDICAL HISTORY: Past Medical History:  Diagnosis Date  . Back pain   . Depression   . Fatty liver   . GERD (gastroesophageal reflux disease)   . Hashimoto's thyroiditis   . HTN (hypertension)   . Hyperlipidemia   . Hypothyroidism   . IBS (irritable bowel syndrome)   . Joint pain   . Lactose intolerance   . Lichen sclerosus   . Obesity   . Psoriasis   . Sleep apnea     PAST SURGICAL HISTORY: Past Surgical History:  Procedure Laterality Date  . ANTERIOR DISC ARTHROPLASTY     2004    SOCIAL HISTORY: Social History  Substance Use Topics  . Smoking status: Never Smoker  . Smokeless tobacco: Never Used  . Alcohol use Not on file  FAMILY HISTORY: Family History  Problem Relation Age of Onset  . Hypertension Mother   . Hyperlipidemia Mother   . Hypertension Father     ROS: Review of Systems  Constitutional: Negative for weight loss.  Gastrointestinal: Negative for nausea and vomiting.  Musculoskeletal:       Negative muscle weakness  Endo/Heme/Allergies:       Negative hypoglycemia    PHYSICAL EXAM: Blood pressure 127/78, pulse 79, temperature 97.8 F (36.6 C), temperature source Oral, height 5\' 3"  (1.6 m), weight 213 lb (96.6 kg), SpO2 99 %. Body mass index is 37.73 kg/m. Physical Exam  Constitutional: She is oriented to person, place, and time. She appears well-developed and well-nourished.    Cardiovascular: Normal rate.   Pulmonary/Chest: Effort normal.  Musculoskeletal: Normal range of motion.  Neurological: She is oriented to person, place, and time.  Skin: Skin is warm and dry.  Psychiatric: She has a normal mood and affect. Her behavior is normal.  Vitals reviewed.   RECENT LABS AND TESTS: BMET    Component Value Date/Time   NA 141 11/23/2016 0832   K 4.6 11/23/2016 0832   CL 100 11/23/2016 0832   CO2 24 11/23/2016 0832   GLUCOSE 102 (H) 11/23/2016 0832   BUN 21 11/23/2016 0832   CREATININE 0.73 11/23/2016 0832   CALCIUM 9.8 11/23/2016 0832   GFRNONAA 92 11/23/2016 0832   GFRAA 106 11/23/2016 0832   Lab Results  Component Value Date   HGBA1C 5.1 11/23/2016   HGBA1C 5.3 08/10/2016   Lab Results  Component Value Date   INSULIN 27.7 (H) 11/23/2016   INSULIN 38.9 (H) 08/10/2016   CBC    Component Value Date/Time   WBC 9.0 08/10/2016 1556   RBC 4.93 08/10/2016 1556   HGB 13.7 08/10/2016 1556   HCT 41.3 08/10/2016 1556   MCV 84 08/10/2016 1556   MCH 27.8 08/10/2016 1556   MCHC 33.2 08/10/2016 1556   RDW 14.0 08/10/2016 1556   LYMPHSABS 2.1 08/10/2016 1556   EOSABS 0.2 08/10/2016 1556   BASOSABS 0.1 08/10/2016 1556   Iron/TIBC/Ferritin/ %Sat No results found for: IRON, TIBC, FERRITIN, IRONPCTSAT Lipid Panel     Component Value Date/Time   CHOL 187 11/23/2016 0832   TRIG 101 11/23/2016 0832   HDL 66 11/23/2016 0832   LDLCALC 101 (H) 11/23/2016 0832   Hepatic Function Panel     Component Value Date/Time   PROT 7.0 11/23/2016 0832   ALBUMIN 4.4 11/23/2016 0832   AST 20 11/23/2016 0832   ALT 23 11/23/2016 0832   ALKPHOS 81 11/23/2016 0832   BILITOT 0.5 11/23/2016 0832      Component Value Date/Time   TSH 0.011 (L) 11/23/2016 0832   TSH 1.170 08/10/2016 1556    ASSESSMENT AND PLAN: Vitamin D deficiency - Plan: Vitamin D, Ergocalciferol, (DRISDOL) 50000 units CAPS capsule  Prediabetes - Plan: metFORMIN (GLUCOPHAGE) 500 MG  tablet  Class 2 obesity without serious comorbidity with body mass index (BMI) of 37.0 to 37.9 in adult, unspecified obesity type  PLAN:  Vitamin D Deficiency Katherine Smith was informed that low vitamin D levels contributes to fatigue and are associated with obesity, breast, and colon cancer. She agrees to continue to take prescription Vit D @50 ,000 IU every week, we will refill for 1 month and will follow up for routine testing of vitamin D, at least 2-3 times per year. She was informed of the risk of over-replacement of vitamin D and agrees to not increase her  dose unless he discusses this with [place] first. Mayli agrees to follow up with our clinic in 2 weeks.  Pre-Diabetes Maudene will continue to work on weight loss, exercise, and decreasing simple carbohydrates in her diet to help decrease the risk of diabetes. We dicussed metformin including benefits and risks. She was informed that eating too many simple carbohydrates or too many calories at one sitting increases the likelihood of GI side effects. Anastaisa agrees to continue metformin for now and a prescription was written today for 1 month refill. Vasilisa agreed to follow up with [place] as directed to monitor her progress.  Obesity Zaley is currently in the action stage of change. As such, her goal is to continue with weight loss efforts She has agreed to change to keep a food journal with 1350 to 1500 calories and 75+ grams of protein  Deniqua has been instructed to work up to a goal of 150 minutes of combined cardio and strengthening exercise per week for weight loss and overall health benefits. We discussed the following Behavioral Modification Strategies today: increasing lean protein intake and meal planning & cooking strategies  Marlo has agreed to follow up with our clinic in 2 weeks. She was informed of the importance of frequent follow up visits to maximize her success with intensive lifestyle modifications for her multiple health  conditions.  I, Doreene Nest, am acting as scribe for Dennard Nip, MD  I have reviewed the above documentation for accuracy and completeness, and I agree with the above. -Dennard Nip, MD  OBESITY BEHAVIORAL INTERVENTION VISIT  Today's visit was # 9 out of 22.  Starting weight: 240 lbs Starting date: 08/10/16 Today's weight : 213 lbs Today's date: 01/17/2017 Total lbs lost to date: 62 (Patients must lose 7 lbs in the first 6 months to continue with counseling)   ASK: We discussed the diagnosis of obesity with Tobin Chad today and Gailyn agreed to give [place] permission to discuss obesity behavioral modification therapy today.  ASSESS: Alpha has the diagnosis of obesity and her BMI today is 37.8 Rosiland is in the action stage of change   ADVISE: Tonilynn was educated on the multiple health risks of obesity as well as the benefit of weight loss to improve her health. She was advised of the need for long term treatment and the importance of lifestyle modifications.  AGREE: Multiple dietary modification options and treatment options were discussed and  Jream agreed to change to keep a food journal with 1350 to 1500 calories and 75+ grams of protein  We discussed the following Behavioral Modification Stratgies today: increasing lean protein intake and meal planning & cooking strategies

## 2017-02-06 ENCOUNTER — Ambulatory Visit
Admission: RE | Admit: 2017-02-06 | Discharge: 2017-02-06 | Disposition: A | Discharge status: Home / Self Care | Payer: 59 | Source: Ambulatory Visit | Attending: Family Medicine | Admitting: Family Medicine

## 2017-02-06 ENCOUNTER — Other Ambulatory Visit: Payer: Self-pay | Admitting: Family Medicine

## 2017-02-06 ENCOUNTER — Encounter: Payer: Self-pay | Admitting: Radiology

## 2017-02-06 DIAGNOSIS — Z1231 Encounter for screening mammogram for malignant neoplasm of breast: Secondary | ICD-10-CM | POA: Diagnosis not present

## 2017-02-07 ENCOUNTER — Ambulatory Visit (INDEPENDENT_AMBULATORY_CARE_PROVIDER_SITE_OTHER): Payer: 59 | Admitting: Family Medicine

## 2017-02-07 VITALS — BP 114/76 | HR 76 | Temp 97.9°F | Ht 63.0 in | Wt 214.0 lb

## 2017-02-07 DIAGNOSIS — R7303 Prediabetes: Secondary | ICD-10-CM | POA: Diagnosis not present

## 2017-02-07 DIAGNOSIS — Z6841 Body Mass Index (BMI) 40.0 and over, adult: Secondary | ICD-10-CM | POA: Insufficient documentation

## 2017-02-07 DIAGNOSIS — E669 Obesity, unspecified: Secondary | ICD-10-CM | POA: Diagnosis not present

## 2017-02-07 DIAGNOSIS — Z9189 Other specified personal risk factors, not elsewhere classified: Secondary | ICD-10-CM | POA: Diagnosis not present

## 2017-02-07 DIAGNOSIS — IMO0001 Reserved for inherently not codable concepts without codable children: Secondary | ICD-10-CM

## 2017-02-07 DIAGNOSIS — Z6837 Body mass index (BMI) 37.0-37.9, adult: Secondary | ICD-10-CM | POA: Diagnosis not present

## 2017-02-07 DIAGNOSIS — E559 Vitamin D deficiency, unspecified: Secondary | ICD-10-CM

## 2017-02-07 MED ORDER — VITAMIN D (ERGOCALCIFEROL) 1.25 MG (50000 UNIT) PO CAPS: 50000.0000 [IU] | ORAL_CAPSULE | ORAL | 0 refills | Status: DC

## 2017-02-07 MED ORDER — METFORMIN HCL 500 MG PO TABS: 500.0000 mg | ORAL_TABLET | Freq: Every morning | ORAL | 0 refills | Status: DC

## 2017-02-07 NOTE — Progress Notes (Signed)
Office: [phone]  /  Fax: [phone]   HPI:   Chief Complaint: OBESITY Katherine Smith is here to discuss her progress with her obesity treatment plan. She is on the  keep a food journal with 1350 to 1500 calories and 75+ grams of protein  and is following her eating plan approximately 50 % of the time. She states she is exercising 0 minutes 0 times per week. Katherine Smith has increased stress at home and has traveled more which led to increased emotional eating. Her weight is 214 lb (97.1 kg) today and has gained 1 pound over a period of 3 weeks since her last visit. She has lost 26 lbs since starting treatment with [place].  Vitamin D deficiency Katherine Smith has a diagnosis of vitamin D deficiency. She is currently taking vit D and denies nausea, vomiting or muscle weakness.  Pre-Diabetes Katherine Smith has a diagnosis of pre-diabetes based on her elevated Hgb A1c and was informed this puts her at greater risk of developing diabetes. She is taking metformin currently and continues to work on diet and exercise to decrease risk of diabetes. She denies nausea, polyphagia or hypoglycemia.  At risk for diabetes Katherine Smith is at higher than average risk for developing diabetes due to her obesity and pre-diabetes. She currently denies polyuria or polydipsia.   ALLERGIES: Allergies  Allergen Reactions  . Atorvastatin     Other reaction(s): Muscle Pain  . Dexfenfluramine     Other reaction(s): Unknown    MEDICATIONS: Current Outpatient Prescriptions on File Prior to Visit  Medication Sig Dispense Refill  . Aspirin-Acetaminophen-Caffeine (EXCEDRIN MIGRAINE PO) Take 1 tablet by mouth daily as needed.    . clobetasol (TEMOVATE) 0.05 % external solution Apply 1 application topically 2 (two) times daily.    . clobetasol ointment (TEMOVATE) 5.10 % Apply 1 application topically 2 (two) times daily.    Marland Kitchen ezetimibe (ZETIA) 10 MG tablet TAKE 1 TABLET BY MOUTH ONCE DAILY.    Marland Kitchen levothyroxine (SYNTHROID, LEVOTHROID) 200 MCG  tablet TAKE 1 TABLET BY MOUTH AS DIRECTED. TAKE ONE TABLET DAILY WITH 25MCG TO EQUAL 225MCG    . levothyroxine (SYNTHROID, LEVOTHROID) 25 MCG tablet TAKE 1 TABLET BY MOUTH AS DIRECTED. TAKE ONE TABLET WITH 200MCG DAILY TO EQUAL 225MCG    . lisinopril (PRINIVIL,ZESTRIL) 10 MG tablet Take 1 tablet (10 mg total) by mouth daily. 30 tablet 0  . naproxen sodium (ANAPROX) 220 MG tablet Take 220 mg by mouth daily as needed.    . pantoprazole (PROTONIX) 40 MG tablet TAKE ONE TABLET BY MOUTH ONCE DAILY     No current facility-administered medications on file prior to visit.     PAST MEDICAL HISTORY: Past Medical History:  Diagnosis Date  . Back pain   . Depression   . Fatty liver   . GERD (gastroesophageal reflux disease)   . Hashimoto's thyroiditis   . HTN (hypertension)   . Hyperlipidemia   . Hypothyroidism   . IBS (irritable bowel syndrome)   . Joint pain   . Lactose intolerance   . Lichen sclerosus   . Obesity   . Psoriasis   . Sleep apnea     PAST SURGICAL HISTORY: Past Surgical History:  Procedure Laterality Date  . ANTERIOR DISC ARTHROPLASTY     2004    SOCIAL HISTORY: Social History  Substance Use Topics  . Smoking status: Never Smoker  . Smokeless tobacco: Never Used  . Alcohol use Not on file    FAMILY HISTORY: Family History  Problem  Relation Age of Onset  . Hypertension Mother   . Hyperlipidemia Mother   . Hypertension Father     ROS: Review of Systems  Constitutional: Negative for weight loss.  Gastrointestinal: Negative for nausea and vomiting.  Genitourinary: Negative for frequency.  Musculoskeletal:       Negative muscle weakness  Endo/Heme/Allergies: Negative for polydipsia.       Negative hypoglycemia Negative polyphagia    PHYSICAL EXAM: Blood pressure 114/76, pulse 76, temperature 97.9 F (36.6 C), temperature source Oral, height 5\' 3"  (1.6 m), weight 214 lb (97.1 kg), last menstrual period 04/14/2016, SpO2 98 %. Body mass index is 37.91  kg/m. Physical Exam  Constitutional: She is oriented to person, place, and time. She appears well-developed and well-nourished.  Cardiovascular: Normal rate.   Pulmonary/Chest: Effort normal.  Musculoskeletal: Normal range of motion.  Neurological: She is oriented to person, place, and time.  Skin: Skin is warm and dry.  Psychiatric: She has a normal mood and affect. Her behavior is normal.  Vitals reviewed.   RECENT LABS AND TESTS: BMET    Component Value Date/Time   NA 141 11/23/2016 0832   K 4.6 11/23/2016 0832   CL 100 11/23/2016 0832   CO2 24 11/23/2016 0832   GLUCOSE 102 (H) 11/23/2016 0832   BUN 21 11/23/2016 0832   CREATININE 0.73 11/23/2016 0832   CALCIUM 9.8 11/23/2016 0832   GFRNONAA 92 11/23/2016 0832   GFRAA 106 11/23/2016 0832   Lab Results  Component Value Date   HGBA1C 5.1 11/23/2016   HGBA1C 5.3 08/10/2016   Lab Results  Component Value Date   INSULIN 27.7 (H) 11/23/2016   INSULIN 38.9 (H) 08/10/2016   CBC    Component Value Date/Time   WBC 9.0 08/10/2016 1556   RBC 4.93 08/10/2016 1556   HGB 13.7 08/10/2016 1556   HCT 41.3 08/10/2016 1556   MCV 84 08/10/2016 1556   MCH 27.8 08/10/2016 1556   MCHC 33.2 08/10/2016 1556   RDW 14.0 08/10/2016 1556   LYMPHSABS 2.1 08/10/2016 1556   EOSABS 0.2 08/10/2016 1556   BASOSABS 0.1 08/10/2016 1556   Iron/TIBC/Ferritin/ %Sat No results found for: IRON, TIBC, FERRITIN, IRONPCTSAT Lipid Panel     Component Value Date/Time   CHOL 187 11/23/2016 0832   TRIG 101 11/23/2016 0832   HDL 66 11/23/2016 0832   LDLCALC 101 (H) 11/23/2016 0832   Hepatic Function Panel     Component Value Date/Time   PROT 7.0 11/23/2016 0832   ALBUMIN 4.4 11/23/2016 0832   AST 20 11/23/2016 0832   ALT 23 11/23/2016 0832   ALKPHOS 81 11/23/2016 0832   BILITOT 0.5 11/23/2016 0832      Component Value Date/Time   TSH 0.011 (L) 11/23/2016 0832   TSH 1.170 08/10/2016 1556    ASSESSMENT AND PLAN: Vitamin D deficiency -  Plan: Vitamin D, Ergocalciferol, (DRISDOL) 50000 units CAPS capsule  Prediabetes - Plan: metFORMIN (GLUCOPHAGE) 500 MG tablet  At risk for diabetes mellitus  Class 2 obesity with serious comorbidity and body mass index (BMI) of 37.0 to 37.9 in adult, unspecified obesity type  PLAN:  Vitamin D Deficiency Katherine Smith was informed that low vitamin D levels contributes to fatigue and are associated with obesity, breast, and colon cancer. She agrees to continue to take prescription Vit D @50 ,000 IU every week, we will refill for 1 month and will follow up for routine testing of vitamin D, at least 2-3 times per year. She was informed of  the risk of over-replacement of vitamin D and agrees to not increase her dose unless he discusses this with [place] first. Katherine Smith agrees to follow up with our clinic in 2 to 3 weeks.  Pre-Diabetes Katherine Smith will continue to work on weight loss, exercise, and decreasing simple carbohydrates in her diet to help decrease the risk of diabetes. We dicussed metformin including benefits and risks. She was informed that eating too many simple carbohydrates or too many calories at one sitting increases the likelihood of GI side effects. Katherine Smith requested metformin for now and a prescription was written today for 1 month refill. Katherine Smith agreed to follow up with [place] as directed to monitor her progress.  Diabetes risk counselling Katherine Smith was given extended (at least 15 minutes) diabetes prevention counseling today. She is 56 y.o. female and has risk factors for diabetes including obesity and pre-diabetes. We discussed intensive lifestyle modifications today with an emphasis on weight loss as well as increasing exercise and decreasing simple carbohydrates in her diet.  Obesity Katherine Smith is currently in the action stage of change. As such, her goal is to continue with weight loss efforts She has agreed to keep a food journal with 400 to 550 calories and 35+ grams of protein  and follow the Category 2  plan Katherine Smith has been instructed to work up to a goal of 150 minutes of combined cardio and strengthening exercise per week for weight loss and overall health benefits. We discussed the following Behavioral Modification Strategies today: increasing lean protein intake and meal planning & cooking strategies  Sherrice has agreed to follow up with our clinic in 2 to 3 weeks. She was informed of the importance of frequent follow up visits to maximize her success with intensive lifestyle modifications for her multiple health conditions.  I, Doreene Nest, am acting as transcriptionist for Dennard Nip, MD  I have reviewed the above documentation for accuracy and completeness, and I agree with the above. -Dennard Nip, MD  OBESITY BEHAVIORAL INTERVENTION VISIT  Today's visit was # 10 out of 22.  Starting weight: 240 lbs Starting date: 08/10/16 Today's weight : 214 lbs Today's date: 02/07/2017 Total lbs lost to date: 48 (Patients must lose 7 lbs in the first 6 months to continue with counseling)   ASK: We discussed the diagnosis of obesity with Tobin Chad today and Conley agreed to give [place] permission to discuss obesity behavioral modification therapy today.  ASSESS: Shakyra has the diagnosis of obesity and her BMI today is 59 Kamri is in the action stage of change   ADVISE: Anicia was educated on the multiple health risks of obesity as well as the benefit of weight loss to improve her health. She was advised of the need for long term treatment and the importance of lifestyle modifications.  AGREE: Multiple dietary modification options and treatment options were discussed and  Clydene agreed to keep a food journal with 400 to 550 calories and 35+ grams of protein  and follow the Category 2 plan We discussed the following Behavioral Modification Strategies today: increasing lean protein intake and meal

## 2017-02-20 ENCOUNTER — Ambulatory Visit (INDEPENDENT_AMBULATORY_CARE_PROVIDER_SITE_OTHER): Payer: 59 | Admitting: Physician Assistant

## 2017-02-20 VITALS — BP 101/67 | HR 84 | Temp 98.1°F | Ht 63.0 in | Wt 211.0 lb

## 2017-02-20 DIAGNOSIS — E669 Obesity, unspecified: Secondary | ICD-10-CM

## 2017-02-20 DIAGNOSIS — R7303 Prediabetes: Secondary | ICD-10-CM

## 2017-02-20 DIAGNOSIS — E559 Vitamin D deficiency, unspecified: Secondary | ICD-10-CM

## 2017-02-20 DIAGNOSIS — E66812 Obesity, class 2: Secondary | ICD-10-CM

## 2017-02-20 DIAGNOSIS — Z9189 Other specified personal risk factors, not elsewhere classified: Secondary | ICD-10-CM

## 2017-02-20 DIAGNOSIS — Z6837 Body mass index (BMI) 37.0-37.9, adult: Secondary | ICD-10-CM | POA: Diagnosis not present

## 2017-02-20 NOTE — Progress Notes (Signed)
Office: [phone]  /  Fax: [phone]   HPI:   Chief Complaint: OBESITY Katherine Smith is here to discuss her progress with her obesity treatment plan. She is on the  keep a food journal with 1200 calories and 85+ protein  and is following her eating plan approximately 75 % of the time. She states she is exercising 0 minutes 0 times per week. Katherine Smith continues to do well with weight loss. She plans ahead well but does not journal all her meals.   Her weight is 211 lb (95.7 kg) today and has had a weight loss of 3 pounds over a period of 2 weeks since her last visit. She has lost 29 lbs since starting treatment with [place].  Pre-Diabetes Katherine Smith has a diagnosis of prediabetes based on her elevated HgA1c and was informed this puts her at greater risk of developing diabetes. She is taking metformin currently and continues to work on diet and exercise to decrease risk of diabetes. She denies nausea or hypoglycemia.  Vitamin D deficiency Katherine Smith has a diagnosis of vitamin D deficiency. She is currently taking vit D and denies nausea, vomiting or muscle weakness.    ALLERGIES: Allergies  Allergen Reactions  . Atorvastatin     Other reaction(s): Muscle Pain  . Dexfenfluramine     Other reaction(s): Unknown    MEDICATIONS: Current Outpatient Prescriptions on File Prior to Visit  Medication Sig Dispense Refill  . Aspirin-Acetaminophen-Caffeine (EXCEDRIN MIGRAINE PO) Take 1 tablet by mouth daily as needed.    . clobetasol (TEMOVATE) 0.05 % external solution Apply 1 application topically 2 (two) times daily.    . clobetasol ointment (TEMOVATE) 4.56 % Apply 1 application topically 2 (two) times daily.    Marland Kitchen ezetimibe (ZETIA) 10 MG tablet TAKE 1 TABLET BY MOUTH ONCE DAILY.    Marland Kitchen levothyroxine (SYNTHROID, LEVOTHROID) 200 MCG tablet TAKE 1 TABLET BY MOUTH AS DIRECTED. TAKE ONE TABLET DAILY WITH 25MCG TO EQUAL 225MCG    . levothyroxine (SYNTHROID, LEVOTHROID) 25 MCG tablet TAKE 1 TABLET BY MOUTH AS  DIRECTED. TAKE ONE TABLET WITH 200MCG DAILY TO EQUAL 225MCG    . lisinopril (PRINIVIL,ZESTRIL) 10 MG tablet Take 1 tablet (10 mg total) by mouth daily. 30 tablet 0  . metFORMIN (GLUCOPHAGE) 500 MG tablet Take 1 tablet (500 mg total) by mouth every morning. 30 tablet 0  . naproxen sodium (ANAPROX) 220 MG tablet Take 220 mg by mouth daily as needed.    . pantoprazole (PROTONIX) 40 MG tablet TAKE ONE TABLET BY MOUTH ONCE DAILY    . Vitamin D, Ergocalciferol, (DRISDOL) 50000 units CAPS capsule Take 1 capsule (50,000 Units total) by mouth every 7 (seven) days. 4 capsule 0   No current facility-administered medications on file prior to visit.     PAST MEDICAL HISTORY: Past Medical History:  Diagnosis Date  . Back pain   . Depression   . Fatty liver   . GERD (gastroesophageal reflux disease)   . Hashimoto's thyroiditis   . HTN (hypertension)   . Hyperlipidemia   . Hypothyroidism   . IBS (irritable bowel syndrome)   . Joint pain   . Lactose intolerance   . Lichen sclerosus   . Obesity   . Psoriasis   . Sleep apnea     PAST SURGICAL HISTORY: Past Surgical History:  Procedure Laterality Date  . ANTERIOR DISC ARTHROPLASTY     2004    SOCIAL HISTORY: Social History  Substance Use Topics  . Smoking status: Never Smoker  .  Smokeless tobacco: Never Used  . Alcohol use Not on file    FAMILY HISTORY: Family History  Problem Relation Age of Onset  . Hypertension Mother   . Hyperlipidemia Mother   . Hypertension Father     ROS: Review of Systems  Constitutional: Positive for weight loss.  Gastrointestinal: Negative for nausea and vomiting.  Musculoskeletal:       Negative muscle weakness  Endo/Heme/Allergies:       Negative polyphagia    PHYSICAL EXAM: Blood pressure 101/67, pulse 84, temperature 98.1 F (36.7 C), temperature source Oral, height 5\' 3"  (1.6 m), weight 211 lb (95.7 kg), last menstrual period 04/14/2016, SpO2 96 %. Body mass index is 37.38 kg/m. Physical  Exam  Constitutional: She is oriented to person, place, and time. She appears well-developed and well-nourished.  Cardiovascular: Normal rate.   Pulmonary/Chest: Effort normal.  Musculoskeletal: Normal range of motion.  Neurological: She is alert and oriented to person, place, and time.  Skin: Skin is warm and dry.    RECENT LABS AND TESTS: BMET    Component Value Date/Time   NA 141 11/23/2016 0832   K 4.6 11/23/2016 0832   CL 100 11/23/2016 0832   CO2 24 11/23/2016 0832   GLUCOSE 102 (H) 11/23/2016 0832   BUN 21 11/23/2016 0832   CREATININE 0.73 11/23/2016 0832   CALCIUM 9.8 11/23/2016 0832   GFRNONAA 92 11/23/2016 0832   GFRAA 106 11/23/2016 0832   Lab Results  Component Value Date   HGBA1C 5.1 11/23/2016   HGBA1C 5.3 08/10/2016   Lab Results  Component Value Date   INSULIN 27.7 (H) 11/23/2016   INSULIN 38.9 (H) 08/10/2016   CBC    Component Value Date/Time   WBC 9.0 08/10/2016 1556   RBC 4.93 08/10/2016 1556   HGB 13.7 08/10/2016 1556   HCT 41.3 08/10/2016 1556   MCV 84 08/10/2016 1556   MCH 27.8 08/10/2016 1556   MCHC 33.2 08/10/2016 1556   RDW 14.0 08/10/2016 1556   LYMPHSABS 2.1 08/10/2016 1556   EOSABS 0.2 08/10/2016 1556   BASOSABS 0.1 08/10/2016 1556   Iron/TIBC/Ferritin/ %Sat No results found for: IRON, TIBC, FERRITIN, IRONPCTSAT Lipid Panel     Component Value Date/Time   CHOL 187 11/23/2016 0832   TRIG 101 11/23/2016 0832   HDL 66 11/23/2016 0832   LDLCALC 101 (H) 11/23/2016 0832   Hepatic Function Panel     Component Value Date/Time   PROT 7.0 11/23/2016 0832   ALBUMIN 4.4 11/23/2016 0832   AST 20 11/23/2016 0832   ALT 23 11/23/2016 0832   ALKPHOS 81 11/23/2016 0832   BILITOT 0.5 11/23/2016 0832      Component Value Date/Time   TSH 0.011 (L) 11/23/2016 0832   TSH 1.170 08/10/2016 1556    ASSESSMENT AND PLAN: Prediabetes  Vitamin D deficiency  Class 2 obesity without serious comorbidity with body mass index (BMI) of 37.0 to  37.9 in adult, unspecified obesity type  PLAN:  Pre-Diabetes Cortlynn will continue to work on weight loss, exercise, and decreasing simple carbohydrates in her diet to help decrease the risk of diabetes. We dicussed metformin including benefits and risks. She was informed that eating too many simple carbohydrates or too many calories at one sitting increases the likelihood of GI side effects. Vitoria will continue to take metformin.Chayah agreed to follow up with [place] as directed to monitor her progress.  Vitamin D Deficiency Larra was informed that low vitamin D levels contributes to fatigue and  are associated with obesity, breast, and colon cancer. She agrees to continue to take prescription Vit D @50 ,000 IU every week and will follow up for routine testing of vitamin D, at least 2-3 times per year. She was informed of the risk of over-replacement of vitamin D and agrees to not increase her dose unless he discusses this with [place] first.  Obesity Pascale is currently in the action stage of change. As such, her goal is to continue with weight loss efforts She has agreed to keep a daily food journal with 1200 calories and 85+ grams of protein.  Cassy has been instructed to work up to a goal of 150 minutes of combined cardio and strengthening exercise per week for weight loss and overall health benefits. We discussed the following Behavioral Modification Stratagies today: increasing lean protein intake and keep a strict food journal.   Kaedyn has agreed to follow up with our clinic in 2 weeks. She was informed of the importance of frequent follow up visits to maximize her success with intensive lifestyle modifications for her multiple health conditions.    Office: [phone]  /  Fax: [phone]  OBESITY BEHAVIORAL INTERVENTION VISIT  Today's visit was # 11 out of 22.  Starting weight: 240  Starting date: 08/10/16 Today's weight : Weight: 211 lb (95.7 kg)  Today's date: 03/01/2017 Total lbs  lost to date: 29 (Patients must lose 7 lbs in the first 6 months to continue with counseling)   ASK: We discussed the diagnosis of obesity with Tobin Chad today and Charee agreed to give [place] permission to discuss obesity behavioral modification therapy today.  ASSESS: Latreece has the diagnosis of obesity and her BMI today is 37.5 Finley is in the action stage of change   ADVISE: Neesa was educated on the multiple health risks of obesity as well as the benefit of weight loss to improve her health. She was advised of the need for long term treatment and the importance of lifestyle modifications.  AGREE: Multiple dietary modification options and treatment options were discussed and  Georgine agreed to keep a food journal with 1200 calories and 85+ g protein  We discussed the following Behavioral Modification Stratagies today: increasing lean protein intake and work on meal planning and easy cooking plans   We spent > than 50% of the 15 minute visit on the counseling as documented in the note.   I have reviewed the above documentation for accuracy and completeness, and I agree with the above. -Lacy Duverney, PA-C  I have reviewed the above note and agree with the plan. -Dennard Nip, MD

## 2017-03-01 DIAGNOSIS — N952 Postmenopausal atrophic vaginitis: Secondary | ICD-10-CM | POA: Diagnosis not present

## 2017-03-01 DIAGNOSIS — Z01411 Encounter for gynecological examination (general) (routine) with abnormal findings: Secondary | ICD-10-CM | POA: Diagnosis not present

## 2017-03-06 ENCOUNTER — Ambulatory Visit (INDEPENDENT_AMBULATORY_CARE_PROVIDER_SITE_OTHER): Payer: 59 | Admitting: Physician Assistant

## 2017-03-06 VITALS — BP 108/72 | HR 83 | Temp 97.7°F | Ht 63.0 in | Wt 212.0 lb

## 2017-03-06 DIAGNOSIS — IMO0001 Reserved for inherently not codable concepts without codable children: Secondary | ICD-10-CM

## 2017-03-06 DIAGNOSIS — E559 Vitamin D deficiency, unspecified: Secondary | ICD-10-CM | POA: Diagnosis not present

## 2017-03-06 DIAGNOSIS — Z6837 Body mass index (BMI) 37.0-37.9, adult: Secondary | ICD-10-CM | POA: Diagnosis not present

## 2017-03-06 DIAGNOSIS — Z9189 Other specified personal risk factors, not elsewhere classified: Secondary | ICD-10-CM | POA: Diagnosis not present

## 2017-03-06 DIAGNOSIS — E669 Obesity, unspecified: Secondary | ICD-10-CM

## 2017-03-06 DIAGNOSIS — R7303 Prediabetes: Secondary | ICD-10-CM

## 2017-03-06 MED ORDER — METFORMIN HCL 500 MG PO TABS: 500.0000 mg | ORAL_TABLET | Freq: Two times a day (BID) | ORAL | 0 refills | Status: DC

## 2017-03-06 MED ORDER — VITAMIN D (ERGOCALCIFEROL) 1.25 MG (50000 UNIT) PO CAPS: 50000.0000 [IU] | ORAL_CAPSULE | ORAL | 0 refills | Status: DC

## 2017-03-07 DIAGNOSIS — E039 Hypothyroidism, unspecified: Secondary | ICD-10-CM | POA: Diagnosis not present

## 2017-03-07 NOTE — Progress Notes (Signed)
Office: [phone]  /  Fax: [phone]   HPI:   Chief Complaint: OBESITY Katherine Smith is here to discuss her progress with her obesity treatment plan. She is on the  keep a food journal with 1200 calories and 85+g protein  and is following her eating plan approximately 75 % of the time. She states she is exercising 0 minutes 0 times per week. Katherine Smith struggled with weight loss as she had increase in emotional eating. She is motivated to get back on track.  Her weight is 212 lb (96.2 kg) today and has gained 1 pound since her last visit. She has lost 28 lbs since starting treatment with [place].  Pre-Diabetes [name] has a diagnosis of prediabetes based on her elevated HgA1c and was informed this puts her at greater risk of developing diabetes. She is taking metformin currently and continues to work on diet and exercise to decrease risk of diabetes. She denies nausea or hypoglycemia.  Vitamin D deficiency Katherine Smith has a diagnosis of vitamin D deficiency. She is currently taking vit D and denies nausea, vomiting or muscle weakness.  At risk for diabetes Katherine Smith is at higher than averagerisk for developing diabetes due to her obesity. She currently denies polyuria or polydipsia.     ALLERGIES: Allergies  Allergen Reactions  . Atorvastatin     Other reaction(s): Muscle Pain  . Dexfenfluramine     Other reaction(s): Unknown    MEDICATIONS: Current Outpatient Prescriptions on File Prior to Visit  Medication Sig Dispense Refill  . Aspirin-Acetaminophen-Caffeine (EXCEDRIN MIGRAINE PO) Take 1 tablet by mouth daily as needed.    . clobetasol (TEMOVATE) 0.05 % external solution Apply 1 application topically 2 (two) times daily.    . clobetasol ointment (TEMOVATE) 8.14 % Apply 1 application topically 2 (two) times daily.    Marland Kitchen ezetimibe (ZETIA) 10 MG tablet TAKE 1 TABLET BY MOUTH ONCE DAILY.    Marland Kitchen levothyroxine (SYNTHROID, LEVOTHROID) 200 MCG tablet TAKE 1 TABLET BY MOUTH AS DIRECTED. TAKE ONE TABLET  DAILY WITH 25MCG TO EQUAL 225MCG    . levothyroxine (SYNTHROID, LEVOTHROID) 25 MCG tablet TAKE 1 TABLET BY MOUTH AS DIRECTED. TAKE ONE TABLET WITH 200MCG DAILY TO EQUAL 225MCG    . lisinopril (PRINIVIL,ZESTRIL) 10 MG tablet Take 1 tablet (10 mg total) by mouth daily. 30 tablet 0  . naproxen sodium (ANAPROX) 220 MG tablet Take 220 mg by mouth daily as needed.    . pantoprazole (PROTONIX) 40 MG tablet TAKE ONE TABLET BY MOUTH ONCE DAILY     No current facility-administered medications on file prior to visit.     PAST MEDICAL HISTORY: Past Medical History:  Diagnosis Date  . Back pain   . Depression   . Fatty liver   . GERD (gastroesophageal reflux disease)   . Hashimoto's thyroiditis   . HTN (hypertension)   . Hyperlipidemia   . Hypothyroidism   . IBS (irritable bowel syndrome)   . Joint pain   . Lactose intolerance   . Lichen sclerosus   . Obesity   . Psoriasis   . Sleep apnea     PAST SURGICAL HISTORY: Past Surgical History:  Procedure Laterality Date  . ANTERIOR DISC ARTHROPLASTY     2004    SOCIAL HISTORY: Social History  Substance Use Topics  . Smoking status: Never Smoker  . Smokeless tobacco: Never Used  . Alcohol use Not on file    FAMILY HISTORY: Family History  Problem Relation Age of Onset  . Hypertension Mother   .  Hyperlipidemia Mother   . Hypertension Father     ROS: Review of Systems  Gastrointestinal: Negative for nausea and vomiting.  Musculoskeletal:       Negative muscle weakness  Endo/Heme/Allergies:       Negative polyphagia    PHYSICAL EXAM: Blood pressure 108/72, pulse 83, temperature 97.7 F (36.5 C), temperature source Oral, height 5\' 3"  (1.6 m), weight 212 lb (96.2 kg), last menstrual period 04/14/2016, SpO2 99 %. Body mass index is 37.55 kg/m. Physical Exam  Constitutional: She is oriented to person, place, and time. She appears well-developed and well-nourished.  Cardiovascular: Normal rate.   Pulmonary/Chest: Effort  normal.  Musculoskeletal: Normal range of motion.  Neurological: She is alert and oriented to person, place, and time.  Skin: Skin is warm and dry.  Psychiatric: She has a normal mood and affect.    RECENT LABS AND TESTS: BMET    Component Value Date/Time   NA 141 11/23/2016 0832   K 4.6 11/23/2016 0832   CL 100 11/23/2016 0832   CO2 24 11/23/2016 0832   GLUCOSE 102 (H) 11/23/2016 0832   BUN 21 11/23/2016 0832   CREATININE 0.73 11/23/2016 0832   CALCIUM 9.8 11/23/2016 0832   GFRNONAA 92 11/23/2016 0832   GFRAA 106 11/23/2016 0832   Lab Results  Component Value Date   HGBA1C 5.1 11/23/2016   HGBA1C 5.3 08/10/2016   Lab Results  Component Value Date   INSULIN 27.7 (H) 11/23/2016   INSULIN 38.9 (H) 08/10/2016   CBC    Component Value Date/Time   WBC 9.0 08/10/2016 1556   RBC 4.93 08/10/2016 1556   HGB 13.7 08/10/2016 1556   HCT 41.3 08/10/2016 1556   MCV 84 08/10/2016 1556   MCH 27.8 08/10/2016 1556   MCHC 33.2 08/10/2016 1556   RDW 14.0 08/10/2016 1556   LYMPHSABS 2.1 08/10/2016 1556   EOSABS 0.2 08/10/2016 1556   BASOSABS 0.1 08/10/2016 1556   Iron/TIBC/Ferritin/ %Sat No results found for: IRON, TIBC, FERRITIN, IRONPCTSAT Lipid Panel     Component Value Date/Time   CHOL 187 11/23/2016 0832   TRIG 101 11/23/2016 0832   HDL 66 11/23/2016 0832   LDLCALC 101 (H) 11/23/2016 0832   Hepatic Function Panel     Component Value Date/Time   PROT 7.0 11/23/2016 0832   ALBUMIN 4.4 11/23/2016 0832   AST 20 11/23/2016 0832   ALT 23 11/23/2016 0832   ALKPHOS 81 11/23/2016 0832   BILITOT 0.5 11/23/2016 0832      Component Value Date/Time   TSH 0.011 (L) 11/23/2016 0832   TSH 1.170 08/10/2016 1556    ASSESSMENT AND PLAN: Prediabetes - Plan: metFORMIN (GLUCOPHAGE) 500 MG tablet  Vitamin D deficiency - Plan: Vitamin D, Ergocalciferol, (DRISDOL) 50000 units CAPS capsule  At risk for diabetes mellitus  Class 2 obesity with serious comorbidity and body mass  index (BMI) of 37.0 to 37.9 in adult, unspecified obesity type  PLAN:  Pre-Diabetes Katherine Smith will continue to work on weight loss, exercise, and decreasing simple carbohydrates in her diet to help decrease the risk of diabetes. We dicussed metformin including benefits and risks. She was informed that eating too many simple carbohydrates or too many calories at one sitting increases the likelihood of GI side effects. Martavia requested metformin for now, she agree to increase Metformin to 500 mg BID, and a prescription was written today. May agreed to follow up with [place] as directed to monitor her progress.  Vitamin D Deficiency Katherine Smith was  informed that low vitamin D levels contributes to fatigue and are associated with obesity, breast, and colon cancer. She agrees to continue to take prescription Vit D @50 ,000 IU every week, refill written today, and will follow up for routine testing of vitamin D, at least 2-3 times per year. She was informed of the risk of over-replacement of vitamin D and agrees to not increase her dose unless he discusses this with [place] first.  Diabetes risk counselling Katherine Smith was given extended (15 minutes) diabetes prevention counseling today. She is 56 y.o. female and has risk factors for diabetes including obesity. We discussed intensive lifestyle modifications today with an emphasis on weight loss as well as increasing exercise and decreasing simple carbohydrates in her diet.  Obesity Katherine Smith is currently in the action stage of change. As such, her goal is to continue with weight loss efforts She has agreed to keep a food journal with 1200 calories and 85+g protein  Mio has been instructed to work up to a goal of 150 minutes of combined cardio and strengthening exercise per week for weight loss and overall health benefits. We discussed the following Behavioral Modification Stratagies today: increasing lean protein intake and emotional eating strategies and better snacking  choices.   Katherine Smith has agreed to follow up with our clinic in 2 weeks. She was informed of the importance of frequent follow up visits to maximize her success with intensive lifestyle modifications for her multiple health conditions.   Office: [phone]  /  Fax: [phone]  OBESITY BEHAVIORAL INTERVENTION VISIT  Today's visit was # 12 out of 22.  Starting weight: 240 Starting date: 08/10/17 Today's weight : Weight: 212 lb (96.2 kg)  Today's date: 03/07/2017 Total lbs lost to date: 45 (Patients must lose 7 lbs in the first 6 months to continue with counseling)   ASK: We discussed the diagnosis of obesity with Katherine Smith today and Katherine Smith agreed to give [place] permission to discuss obesity behavioral modification therapy today.  ASSESS: Katherine Smith has the diagnosis of obesity and her BMI today is 37.6 Katherine Smith is in the action stage of change   ADVISE: Katherine Smith was educated on the multiple health risks of obesity as well as the benefit of weight loss to improve her health. She was advised of the need for long term treatment and the importance of lifestyle modifications.  AGREE: Multiple dietary modification options and treatment options were discussed and  Katherine Smith agreed to keep a food journal with 1200 calories and 85+g protein  We discussed the following Behavioral Modification Stratagies today: increasing lean protein intake and emotional eating strategies and better snacking choices.   I have reviewed the above documentation for accuracy and completeness, and I agree with the above. -Lacy Duverney, PA-C  I have reviewed the above note and agree with the plan. -Dennard Nip, MD

## 2017-03-20 ENCOUNTER — Ambulatory Visit (INDEPENDENT_AMBULATORY_CARE_PROVIDER_SITE_OTHER): Payer: 59 | Admitting: Physician Assistant

## 2017-03-28 ENCOUNTER — Ambulatory Visit (INDEPENDENT_AMBULATORY_CARE_PROVIDER_SITE_OTHER): Payer: 59 | Admitting: Physician Assistant

## 2017-03-28 ENCOUNTER — Encounter (INDEPENDENT_AMBULATORY_CARE_PROVIDER_SITE_OTHER): Payer: Self-pay

## 2017-04-03 ENCOUNTER — Encounter: Payer: Self-pay | Admitting: *Deleted

## 2017-04-04 ENCOUNTER — Ambulatory Visit: Payer: 59 | Admitting: Anesthesiology

## 2017-04-04 ENCOUNTER — Ambulatory Visit
Admission: RE | Admit: 2017-04-04 | Discharge: 2017-04-04 | Disposition: A | Discharge status: Home / Self Care | Payer: 59 | Source: Ambulatory Visit | Attending: Unknown Physician Specialty | Admitting: Unknown Physician Specialty

## 2017-04-04 ENCOUNTER — Encounter
Admission: RE | Disposition: A | Discharge status: Home / Self Care | Payer: Self-pay | Source: Ambulatory Visit | Attending: Unknown Physician Specialty

## 2017-04-04 ENCOUNTER — Encounter: Payer: Self-pay | Admitting: *Deleted

## 2017-04-04 DIAGNOSIS — Z1211 Encounter for screening for malignant neoplasm of colon: Secondary | ICD-10-CM | POA: Diagnosis not present

## 2017-04-04 DIAGNOSIS — Z86010 Personal history of colonic polyps: Secondary | ICD-10-CM | POA: Diagnosis not present

## 2017-04-04 DIAGNOSIS — I10 Essential (primary) hypertension: Secondary | ICD-10-CM | POA: Diagnosis not present

## 2017-04-04 DIAGNOSIS — D123 Benign neoplasm of transverse colon: Secondary | ICD-10-CM | POA: Insufficient documentation

## 2017-04-04 DIAGNOSIS — E039 Hypothyroidism, unspecified: Secondary | ICD-10-CM | POA: Insufficient documentation

## 2017-04-04 DIAGNOSIS — G4730 Sleep apnea, unspecified: Secondary | ICD-10-CM | POA: Diagnosis not present

## 2017-04-04 DIAGNOSIS — Z79899 Other long term (current) drug therapy: Secondary | ICD-10-CM | POA: Diagnosis not present

## 2017-04-04 DIAGNOSIS — K648 Other hemorrhoids: Secondary | ICD-10-CM | POA: Diagnosis not present

## 2017-04-04 DIAGNOSIS — K219 Gastro-esophageal reflux disease without esophagitis: Secondary | ICD-10-CM | POA: Insufficient documentation

## 2017-04-04 DIAGNOSIS — F329 Major depressive disorder, single episode, unspecified: Secondary | ICD-10-CM | POA: Diagnosis not present

## 2017-04-04 DIAGNOSIS — E785 Hyperlipidemia, unspecified: Secondary | ICD-10-CM | POA: Insufficient documentation

## 2017-04-04 DIAGNOSIS — K635 Polyp of colon: Secondary | ICD-10-CM | POA: Insufficient documentation

## 2017-04-04 DIAGNOSIS — K5790 Diverticulosis of intestine, part unspecified, without perforation or abscess without bleeding: Secondary | ICD-10-CM | POA: Diagnosis not present

## 2017-04-04 HISTORY — DX: Malignant (primary) neoplasm, unspecified: C80.1

## 2017-04-04 HISTORY — PX: COLONOSCOPY WITH PROPOFOL: SHX5780

## 2017-04-04 SURGERY — COLONOSCOPY WITH PROPOFOL
Anesthesia: General

## 2017-04-04 MED ORDER — MIDAZOLAM HCL 2 MG/2ML IJ SOLN
INTRAMUSCULAR | Status: DC | PRN
  Administered 2017-04-04: 2 mg via INTRAVENOUS

## 2017-04-04 MED ORDER — FENTANYL CITRATE (PF) 100 MCG/2ML IJ SOLN
INTRAMUSCULAR | Status: DC | PRN
  Administered 2017-04-04: 50 ug via INTRAVENOUS

## 2017-04-04 MED ORDER — SODIUM CHLORIDE 0.9 % IV SOLN
INTRAVENOUS | Status: DC
  Administered 2017-04-04: 13:00:00 via INTRAVENOUS

## 2017-04-04 MED ORDER — FENTANYL CITRATE (PF) 100 MCG/2ML IJ SOLN
INTRAMUSCULAR | Status: AC
  Filled 2017-04-04: qty 2

## 2017-04-04 MED ORDER — PROPOFOL 500 MG/50ML IV EMUL
INTRAVENOUS | Status: AC
  Filled 2017-04-04: qty 50

## 2017-04-04 MED ORDER — PROPOFOL 500 MG/50ML IV EMUL
INTRAVENOUS | Status: DC | PRN
  Administered 2017-04-04: 120 ug/kg/min via INTRAVENOUS

## 2017-04-04 MED ORDER — SODIUM CHLORIDE 0.9 % IV SOLN: INTRAVENOUS | Status: DC

## 2017-04-04 MED ORDER — MIDAZOLAM HCL 2 MG/2ML IJ SOLN
INTRAMUSCULAR | Status: AC
  Filled 2017-04-04: qty 2

## 2017-04-04 NOTE — Anesthesia Preprocedure Evaluation (Signed)
Anesthesia Evaluation  Patient identified by MRN, date of birth, ID band Patient awake    Reviewed: Allergy & Precautions, NPO status , Patient's Chart, lab work & pertinent test results  History of Anesthesia Complications Negative for: history of anesthetic complications  Airway Mallampati: I  TM Distance: >3 FB Neck ROM: Full    Dental no notable dental hx.    Pulmonary sleep apnea , neg COPD,    breath sounds clear to auscultation- rhonchi (-) wheezing      Cardiovascular hypertension, Pt. on medications (-) CAD, (-) Past MI and (-) Cardiac Stents  Rhythm:Regular Rate:Normal - Systolic murmurs and - Diastolic murmurs    Neuro/Psych PSYCHIATRIC DISORDERS Depression negative neurological ROS     GI/Hepatic Neg liver ROS, GERD  ,  Endo/Other  neg diabetesHypothyroidism   Renal/GU negative Renal ROS     Musculoskeletal negative musculoskeletal ROS (+)   Abdominal (+) + obese,   Peds  Hematology negative hematology ROS (+)   Anesthesia Other Findings Past Medical History: No date: Back pain No date: Cancer (HCC)     Comment:  BASAL CELL SKIN CANCER No date: Depression No date: Fatty liver No date: GERD (gastroesophageal reflux disease) No date: Hashimoto's thyroiditis No date: HTN (hypertension) No date: Hyperlipidemia No date: Hypothyroidism No date: IBS (irritable bowel syndrome) No date: Joint pain No date: Lactose intolerance No date: Lichen sclerosus No date: Obesity No date: Psoriasis No date: Sleep apnea   Reproductive/Obstetrics                             Anesthesia Physical Anesthesia Plan  ASA: III  Anesthesia Plan: General   Post-op Pain Management:    Induction: Intravenous  PONV Risk Score and Plan: 2 and Propofol infusion  Airway Management Planned: Natural Airway  Additional Equipment:   Intra-op Plan:   Post-operative Plan:   Informed Consent:  I have reviewed the patients History and Physical, chart, labs and discussed the procedure including the risks, benefits and alternatives for the proposed anesthesia with the patient or authorized representative who has indicated his/her understanding and acceptance.   Dental advisory given  Plan Discussed with: CRNA and Anesthesiologist  Anesthesia Plan Comments:         Anesthesia Quick Evaluation

## 2017-04-04 NOTE — Anesthesia Procedure Notes (Signed)
Performed by: COOK-MARTIN, Jushua Waltman Pre-anesthesia Checklist: Patient identified, Emergency Drugs available, Suction available, Patient being monitored and Timeout performed Patient Re-evaluated:Patient Re-evaluated prior to induction Oxygen Delivery Method: Nasal cannula Preoxygenation: Pre-oxygenation with 100% oxygen Induction Type: IV induction Placement Confirmation: positive ETCO2 and CO2 detector       

## 2017-04-04 NOTE — H&P (Signed)
Primary Care Physician:  Dion Body, MD Primary Gastroenterologist:  Dr. Vira Agar  Pre-Procedure History & Physical: HPI:  Katherine Smith is a 56 y.o. female is here for an colonoscopy.   Past Medical History:  Diagnosis Date  . Back pain   . Cancer (HCC)    BASAL CELL SKIN CANCER  . Depression   . Fatty liver   . GERD (gastroesophageal reflux disease)   . Hashimoto's thyroiditis   . HTN (hypertension)   . Hyperlipidemia   . Hypothyroidism   . IBS (irritable bowel syndrome)   . Joint pain   . Lactose intolerance   . Lichen sclerosus   . Obesity   . Psoriasis   . Sleep apnea     Past Surgical History:  Procedure Laterality Date  . ANTERIOR DISC ARTHROPLASTY     2004  . COLONOSCOPY  12/18/2011    Prior to Admission medications   Medication Sig Start Date End Date Taking? Authorizing Provider  Aspirin-Acetaminophen-Caffeine (EXCEDRIN MIGRAINE PO) Take 1 tablet by mouth daily as needed.   Yes [provider]  clobetasol (TEMOVATE) 0.05 % external solution Apply 1 application topically 2 (two) times daily.   Yes [provider]  ezetimibe (ZETIA) 10 MG tablet TAKE 1 TABLET BY MOUTH ONCE DAILY. 04/14/16  Yes [provider]  levothyroxine (SYNTHROID, LEVOTHROID) 175 MCG tablet Take 175 mcg by mouth daily before breakfast.   Yes [provider]  lisinopril-hydrochlorothiazide (PRINZIDE,ZESTORETIC) 10-12.5 MG tablet Take 1 tablet by mouth daily.   Yes [provider]  metFORMIN (GLUCOPHAGE) 500 MG tablet Take 1 tablet (500 mg total) by mouth 2 (two) times daily with a meal. 03/06/17  Yes Leafy Ro, Caren D, MD  naproxen sodium (ANAPROX) 220 MG tablet Take 220 mg by mouth daily as needed.   Yes [provider]  pantoprazole (PROTONIX) 40 MG tablet TAKE ONE TABLET BY MOUTH ONCE DAILY 12/30/15  Yes [provider]  Vitamin D, Ergocalciferol, (DRISDOL) 50000 units CAPS capsule Take 1 capsule (50,000 Units total) by  mouth every 7 (seven) days. 03/06/17  Yes Dennard Nip D, MD    Allergies as of 01/19/2017 - Review Complete 01/17/2017  Allergen Reaction Noted  . Atorvastatin  04/23/2014  . Dexfenfluramine  04/23/2014    Family History  Problem Relation Age of Onset  . Hypertension Mother   . Hyperlipidemia Mother   . Hypertension Father     Social History   Social History  . Marital status: Married    Spouse name: N/A  . Number of children: N/A  . Years of education: N/A   Occupational History  . 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 Smoking status: Never Smoker  . Smokeless tobacco: Never Used  . Alcohol use No  . Drug use: No  . Sexual activity: Not on file   Other Topics Concern  . Not on file   Social History Narrative  . No narrative on file    Review of Systems: See HPI, otherwise negative ROS  Physical Exam: BP (!) 136/99   Pulse 99   Temp 97.6 F (36.4 C) (Tympanic)   Resp 18   Ht 5\' 3"  (1.6 m)   Wt 97.1 kg (214 lb)   LMP 04/14/2016   SpO2 100%   BMI 37.91 kg/m  General:   Alert,  pleasant and cooperative in NAD Head:  Normocephalic and atraumatic. Neck:  Supple; no masses or thyromegaly. Lungs:  Clear throughout to  auscultation.    Heart:  Regular rate and rhythm. Abdomen:  Soft, nontender and nondistended. Normal bowel sounds, without guarding, and without rebound.   Neurologic:  Alert and  oriented x4;  grossly normal neurologically.  Impression/Plan: Katherine Smith is here for an colonoscopy to be performed for Personal history of colon polyps.  Risks, benefits, limitations, and alternatives regarding  colonoscopy have been reviewed with the patient.  Questions have been answered.  All parties agreeable.   Gaylyn Cheers, MD  04/04/2017, 1:37 PM

## 2017-04-04 NOTE — Anesthesia Postprocedure Evaluation (Signed)
Anesthesia Post Note  Patient: Katherine Smith  Procedure(s) Performed: Procedure(s) (LRB): COLONOSCOPY WITH PROPOFOL (N/A)  Patient location during evaluation: PACU Anesthesia Type: General Level of consciousness: awake and alert and oriented Pain management: pain level controlled Vital Signs Assessment: post-procedure vital signs reviewed and stable Respiratory status: spontaneous breathing Cardiovascular status: blood pressure returned to baseline Anesthetic complications: no     Last Vitals:  Vitals:   04/04/17 1430 04/04/17 1440  BP: 105/63 107/67  Pulse: 94 90  Resp: 15 16  Temp:    SpO2: 99% 100%    Last Pain:  Vitals:   04/04/17 1410  TempSrc: Tympanic  PainSc:                  Iver Miklas

## 2017-04-04 NOTE — Op Note (Signed)
[facility] Patient Name: Katherine Smith Procedure Date: 04/04/2017 1:41 PM MRN: [id number] Account #: [id number] Date of Birth: Apr 05, 1961 Admit Type: Outpatient Age: 56 Room: [facility] ENDO ROOM 3 Gender: Female Note Status: Finalized Procedure:            Colonoscopy Indications:          High risk colon cancer surveillance: Personal history                        of colonic polyps Providers:            Manya Silvas, MD Referring MD:         Dion Body (Referring MD) Medicines:            Propofol per Anesthesia Complications:        No immediate complications. Procedure:            Pre-Anesthesia Assessment:                       - After reviewing the risks and benefits, the patient                        was deemed in satisfactory condition to undergo the                        procedure.                       After obtaining informed consent, the colonoscope was                        passed under direct vision. Throughout the procedure,                        the patient's blood pressure, pulse, and oxygen                        saturations were monitored continuously. The                        Colonoscope was introduced through the anus and                        advanced to the the cecum, identified by appendiceal                        orifice and ileocecal valve. Findings:      A diminutive polyp was found in the sigmoid colon. The polyp was       sessile. The polyp was removed with a jumbo cold forceps. Resection and       retrieval were complete.      A diminutive polyp was found in the transverse colon. The polyp was       sessile. The polyp was removed with a jumbo cold forceps. Resection and       retrieval were complete.      The exam was otherwise without abnormality. Impression:           - One diminutive polyp in the sigmoid colon, removed                        with a  jumbo cold forceps. Resected and retrieved.                    - One diminutive polyp in the transverse colon, removed                        with a jumbo cold forceps. Resected and retrieved.                       - The examination was otherwise normal. Recommendation:       - Await pathology results. Manya Silvas, MD 04/04/2017 2:06:13 PM This report has been signed electronically. Number of Addenda: 0 Note Initiated On: 04/04/2017 1:41 PM Scope Withdrawal Time: 0 hours 10 minutes 11 seconds  Total Procedure Duration: 0 hours 17 minutes 40 seconds       [facility]

## 2017-04-04 NOTE — Transfer of Care (Signed)
Immediate Anesthesia Transfer of Care Note  Patient: Katherine Smith  Procedure(s) Performed: Procedure(s): COLONOSCOPY WITH PROPOFOL (N/A)  Patient Location: PACU  Anesthesia Type:General  Level of Consciousness: awake and sedated  Airway & Oxygen Therapy: Patient Spontanous Breathing and Patient connected to nasal cannula oxygen  Post-op Assessment: Report given to RN and Post -op Vital signs reviewed and stable  Post vital signs: Reviewed and stable  Last Vitals:  Vitals:   04/04/17 1250  BP: (!) 136/99  Pulse: 99  Resp: 18  Temp: 36.4 C  SpO2: 100%    Last Pain:  Vitals:   04/04/17 1250  TempSrc: Tympanic  PainSc: 4          Complications: No apparent anesthesia complications

## 2017-04-04 NOTE — Anesthesia Post-op Follow-up Note (Signed)
Anesthesia QCDR form completed.        

## 2017-04-05 ENCOUNTER — Encounter: Payer: Self-pay | Admitting: Unknown Physician Specialty

## 2017-04-06 LAB — SURGICAL PATHOLOGY

## 2017-06-19 ENCOUNTER — Ambulatory Visit: Payer: Self-pay | Admitting: Physician Assistant

## 2017-06-19 ENCOUNTER — Encounter: Payer: Self-pay | Admitting: Physician Assistant

## 2017-06-19 VITALS — BP 124/98 | HR 110 | Temp 98.2°F

## 2017-06-19 DIAGNOSIS — B029 Zoster without complications: Secondary | ICD-10-CM

## 2017-06-19 MED ORDER — FAMCICLOVIR 500 MG PO TABS: 500.0000 mg | ORAL_TABLET | Freq: Three times a day (TID) | ORAL | 0 refills | Status: DC

## 2017-06-19 NOTE — Progress Notes (Signed)
S: c/o rash to upper back, states when she had shingles on her face it started the same way, is a little itchy, same as last time, no pain yet, no fever, chills, 2. does have a cough and some congestion but it feels like a cold, no fever/chills/cp/sob, mucus is green in the mornings only, took sudafed this morning  O: vitals w elevated hr due to sudafed, tms clear, nasal mucosa boggy, throat wnl, neck supple no lymph, lungs c t a, cv rrr, skin with small red bumps in dermatome on back, no vesicles at this time  A: ?shingles, common cold  P: trial of famvir 500mg  tid x 7d

## 2017-07-13 DIAGNOSIS — I10 Essential (primary) hypertension: Secondary | ICD-10-CM | POA: Diagnosis not present

## 2017-07-13 DIAGNOSIS — E039 Hypothyroidism, unspecified: Secondary | ICD-10-CM | POA: Diagnosis not present

## 2017-07-13 DIAGNOSIS — E782 Mixed hyperlipidemia: Secondary | ICD-10-CM | POA: Diagnosis not present

## 2017-07-20 DIAGNOSIS — E782 Mixed hyperlipidemia: Secondary | ICD-10-CM | POA: Diagnosis not present

## 2017-07-20 DIAGNOSIS — I10 Essential (primary) hypertension: Secondary | ICD-10-CM | POA: Diagnosis not present

## 2017-07-20 DIAGNOSIS — E039 Hypothyroidism, unspecified: Secondary | ICD-10-CM | POA: Diagnosis not present

## 2017-07-26 DIAGNOSIS — H52223 Regular astigmatism, bilateral: Secondary | ICD-10-CM | POA: Diagnosis not present

## 2017-07-26 DIAGNOSIS — H5213 Myopia, bilateral: Secondary | ICD-10-CM | POA: Diagnosis not present

## 2017-07-26 DIAGNOSIS — H524 Presbyopia: Secondary | ICD-10-CM | POA: Diagnosis not present

## 2017-08-10 ENCOUNTER — Ambulatory Visit: Payer: 59 | Attending: Internal Medicine

## 2017-08-10 DIAGNOSIS — G4733 Obstructive sleep apnea (adult) (pediatric): Secondary | ICD-10-CM | POA: Diagnosis not present

## 2017-09-03 DIAGNOSIS — I10 Essential (primary) hypertension: Secondary | ICD-10-CM | POA: Diagnosis not present

## 2017-09-03 DIAGNOSIS — G4733 Obstructive sleep apnea (adult) (pediatric): Secondary | ICD-10-CM | POA: Diagnosis not present

## 2017-09-14 ENCOUNTER — Encounter: Payer: Self-pay | Admitting: Psychiatry

## 2017-09-14 ENCOUNTER — Ambulatory Visit: Payer: Self-pay | Admitting: Psychiatry

## 2017-09-14 VITALS — BP 127/70 | HR 78 | Temp 97.7°F | Wt 235.0 lb

## 2017-09-14 DIAGNOSIS — M722 Plantar fascial fibromatosis: Secondary | ICD-10-CM

## 2017-09-14 MED ORDER — MELOXICAM 7.5 MG PO TABS: 7.5000 mg | ORAL_TABLET | Freq: Every day | ORAL | 0 refills | Status: DC

## 2017-09-14 NOTE — Progress Notes (Signed)
Current episode started about 2 weeks, tried the heel up, wears comforter progressively getting worse. Have been using ibuprofen 800mg , denies any trauma to the right foot. Stands on her feel about 10 hours a day. Subjective:    Katherine Smith is a 57 y.o. female who presents with right plantar pain. Onset of the symptoms was gradual, starting about 2 weeks ago. Pain is currently located right plantar. Pain is described as aching and sharp, with intensity at worst with movement on a 8/10 pain scale. The pain is constant.  Symptoms have gradually worsened. Patient has had prior leg problems. Evaluation to date: none. Treatment to date: OTC analgesics which are somewhat effective.   The following portions of the patient's history were reviewed and updated as appropriate: allergies, current medications, past family history, past medical history, past social history, past surgical history and problem list.  Review of Systems Pertinent items are noted in HPI.     Objective:    BP 127/70   Pulse 78   Temp 97.7 F (36.5 C)   Wt 235 lb (106.6 kg)   LMP 04/14/2016   SpO2 96%   BMI 41.63 kg/m   General appearance: alert, cooperative and no distress Lungs: clear to auscultation bilaterally Heart: regular rate and rhythm, S1, S2 normal, no murmur, click, rub or gallop   Right Foot Exam: Tenderness to the medial and lateral ankle with min palpation. No sign of swelling.    Assessment:   Right Plantar fasciitis   Plan:   Katherine Smith was seen today for save-platar fascitis.  Diagnoses and all orders for this visit:  Plantar fasciitis  Other orders -     meloxicam (MOBIC) 7.5 MG tablet; Take 1 tablet (7.5 mg total) by mouth daily.  Patient Instructions: Take Meloxicam 7.5 mg daily for 7 days. Continue range of motion to the feet Follow up with your PCP for orthopedics referal if not better in 7-10days or sooner if worsening.

## 2017-09-14 NOTE — Patient Instructions (Signed)

## 2017-09-28 DIAGNOSIS — Z85828 Personal history of other malignant neoplasm of skin: Secondary | ICD-10-CM | POA: Diagnosis not present

## 2017-09-28 DIAGNOSIS — E039 Hypothyroidism, unspecified: Secondary | ICD-10-CM | POA: Diagnosis not present

## 2017-09-28 DIAGNOSIS — L400 Psoriasis vulgaris: Secondary | ICD-10-CM | POA: Diagnosis not present

## 2017-09-28 DIAGNOSIS — L900 Lichen sclerosus et atrophicus: Secondary | ICD-10-CM | POA: Diagnosis not present

## 2017-09-28 DIAGNOSIS — D225 Melanocytic nevi of trunk: Secondary | ICD-10-CM | POA: Diagnosis not present

## 2017-10-04 DIAGNOSIS — I10 Essential (primary) hypertension: Secondary | ICD-10-CM | POA: Diagnosis not present

## 2017-10-04 DIAGNOSIS — G4733 Obstructive sleep apnea (adult) (pediatric): Secondary | ICD-10-CM | POA: Diagnosis not present

## 2017-10-10 DIAGNOSIS — I10 Essential (primary) hypertension: Secondary | ICD-10-CM | POA: Diagnosis not present

## 2017-10-10 DIAGNOSIS — G4733 Obstructive sleep apnea (adult) (pediatric): Secondary | ICD-10-CM | POA: Diagnosis not present

## 2017-11-01 DIAGNOSIS — I10 Essential (primary) hypertension: Secondary | ICD-10-CM | POA: Diagnosis not present

## 2017-11-01 DIAGNOSIS — G4733 Obstructive sleep apnea (adult) (pediatric): Secondary | ICD-10-CM | POA: Diagnosis not present

## 2017-11-21 DIAGNOSIS — I10 Essential (primary) hypertension: Secondary | ICD-10-CM | POA: Diagnosis not present

## 2017-11-21 DIAGNOSIS — E039 Hypothyroidism, unspecified: Secondary | ICD-10-CM | POA: Diagnosis not present

## 2017-11-21 DIAGNOSIS — E782 Mixed hyperlipidemia: Secondary | ICD-10-CM | POA: Diagnosis not present

## 2017-11-22 DIAGNOSIS — E782 Mixed hyperlipidemia: Secondary | ICD-10-CM | POA: Diagnosis not present

## 2017-11-22 DIAGNOSIS — I10 Essential (primary) hypertension: Secondary | ICD-10-CM | POA: Diagnosis not present

## 2017-11-26 DIAGNOSIS — G4733 Obstructive sleep apnea (adult) (pediatric): Secondary | ICD-10-CM | POA: Diagnosis not present

## 2017-11-26 DIAGNOSIS — I10 Essential (primary) hypertension: Secondary | ICD-10-CM | POA: Diagnosis not present

## 2017-12-02 DIAGNOSIS — I10 Essential (primary) hypertension: Secondary | ICD-10-CM | POA: Diagnosis not present

## 2017-12-02 DIAGNOSIS — G4733 Obstructive sleep apnea (adult) (pediatric): Secondary | ICD-10-CM | POA: Diagnosis not present

## 2018-01-19 ENCOUNTER — Telehealth: Payer: 59 | Admitting: Family Medicine

## 2018-01-19 ENCOUNTER — Other Ambulatory Visit: Payer: Self-pay | Admitting: Family Medicine

## 2018-01-19 DIAGNOSIS — N3000 Acute cystitis without hematuria: Secondary | ICD-10-CM | POA: Diagnosis not present

## 2018-01-19 MED ORDER — CEPHALEXIN 500 MG PO CAPS
500.0000 mg | ORAL_CAPSULE | Freq: Two times a day (BID) | ORAL | 0 refills | Status: AC
Start: 2018-01-19 — End: 2018-01-26

## 2018-01-19 NOTE — Progress Notes (Signed)
We are sorry that you are not feeling well.  Here is how we plan to help!  Based on what you shared with me it looks like you most likely have a simple urinary tract infection.  A UTI (Urinary Tract Infection) is a bacterial infection of the bladder.  Most cases of urinary tract infections are simple to treat but a key part of your care is to encourage you to drink plenty of fluids and watch your symptoms carefully.  I have prescribed Keflex 500 mg twice a day for 7 days.  Your symptoms should gradually improve. Call us if the burning in your urine worsens, you develop worsening fever, back pain or pelvic pain or if your symptoms do not resolve after completing the antibiotic.  Urinary tract infections can be prevented by drinking plenty of water to keep your body hydrated.  Also be sure when you wipe, wipe from front to back and don't hold it in!  If possible, empty your bladder every 4 hours.  Your e-visit answers were reviewed by a board certified advanced clinical practitioner to complete your personal care plan.  Depending on the condition, your plan could have included both over the counter or prescription medications.  If there is a problem please reply once you have received a response from your provider.  Your safety is important to [place].  If you have drug allergies check your prescription carefully.    You can use MyChart to ask questions about today's visit, request a non-urgent call back, or ask for a work or school excuse for 24 hours related to this e-Visit. If it has been greater than 24 hours you will need to follow up with your provider, or enter a new e-Visit to address those concerns.   You will get an e-mail in the next two days asking about your experience.  I hope that your e-visit has been valuable and will speed your recovery. Thank you for using e-visits.  Carroll Sage. Kenton Kingfisher, MSN, FNP-C [place]

## 2018-01-19 NOTE — Progress Notes (Signed)
E-prescribed antibiotic from e-visit encounter 01/19/18

## 2018-02-18 DIAGNOSIS — G4733 Obstructive sleep apnea (adult) (pediatric): Secondary | ICD-10-CM | POA: Diagnosis not present

## 2018-02-18 DIAGNOSIS — I10 Essential (primary) hypertension: Secondary | ICD-10-CM | POA: Diagnosis not present

## 2018-03-05 ENCOUNTER — Other Ambulatory Visit: Payer: Self-pay | Admitting: Obstetrics and Gynecology

## 2018-03-05 DIAGNOSIS — Z1231 Encounter for screening mammogram for malignant neoplasm of breast: Secondary | ICD-10-CM | POA: Diagnosis not present

## 2018-03-05 DIAGNOSIS — Z01419 Encounter for gynecological examination (general) (routine) without abnormal findings: Secondary | ICD-10-CM | POA: Diagnosis not present

## 2018-03-21 ENCOUNTER — Ambulatory Visit
Admission: RE | Admit: 2018-03-21 | Discharge: 2018-03-21 | Disposition: A | Discharge status: Home / Self Care | Payer: 59 | Source: Ambulatory Visit | Attending: Obstetrics and Gynecology | Admitting: Obstetrics and Gynecology

## 2018-03-21 DIAGNOSIS — Z1231 Encounter for screening mammogram for malignant neoplasm of breast: Secondary | ICD-10-CM | POA: Diagnosis not present

## 2018-03-28 DIAGNOSIS — I10 Essential (primary) hypertension: Secondary | ICD-10-CM | POA: Diagnosis not present

## 2018-03-28 DIAGNOSIS — E782 Mixed hyperlipidemia: Secondary | ICD-10-CM | POA: Diagnosis not present

## 2018-03-28 DIAGNOSIS — E039 Hypothyroidism, unspecified: Secondary | ICD-10-CM | POA: Diagnosis not present

## 2018-03-29 DIAGNOSIS — I10 Essential (primary) hypertension: Secondary | ICD-10-CM | POA: Diagnosis not present

## 2018-03-29 DIAGNOSIS — E039 Hypothyroidism, unspecified: Secondary | ICD-10-CM | POA: Diagnosis not present

## 2018-03-29 DIAGNOSIS — E782 Mixed hyperlipidemia: Secondary | ICD-10-CM | POA: Diagnosis not present

## 2018-05-13 DIAGNOSIS — E039 Hypothyroidism, unspecified: Secondary | ICD-10-CM | POA: Diagnosis not present

## 2018-05-20 DIAGNOSIS — I10 Essential (primary) hypertension: Secondary | ICD-10-CM | POA: Diagnosis not present

## 2018-05-20 DIAGNOSIS — G4733 Obstructive sleep apnea (adult) (pediatric): Secondary | ICD-10-CM | POA: Diagnosis not present

## 2018-06-21 ENCOUNTER — Telehealth: Payer: 59 | Admitting: Family

## 2018-06-21 DIAGNOSIS — N390 Urinary tract infection, site not specified: Secondary | ICD-10-CM | POA: Diagnosis not present

## 2018-06-21 MED ORDER — CEPHALEXIN 500 MG PO CAPS: 500.0000 mg | ORAL_CAPSULE | Freq: Two times a day (BID) | ORAL | 0 refills | Status: DC

## 2018-06-21 NOTE — Progress Notes (Signed)

## 2018-07-31 DIAGNOSIS — F5101 Primary insomnia: Secondary | ICD-10-CM | POA: Insufficient documentation

## 2018-07-31 DIAGNOSIS — E782 Mixed hyperlipidemia: Secondary | ICD-10-CM | POA: Diagnosis not present

## 2018-07-31 DIAGNOSIS — I10 Essential (primary) hypertension: Secondary | ICD-10-CM | POA: Diagnosis not present

## 2018-07-31 DIAGNOSIS — Z0000 Encounter for general adult medical examination without abnormal findings: Secondary | ICD-10-CM | POA: Diagnosis not present

## 2018-08-12 ENCOUNTER — Other Ambulatory Visit: Payer: Self-pay | Admitting: Family Medicine

## 2018-08-12 ENCOUNTER — Ambulatory Visit
Admission: RE | Admit: 2018-08-12 | Discharge: 2018-08-12 | Disposition: A | Discharge status: Home / Self Care | Payer: 59 | Source: Ambulatory Visit | Attending: Family Medicine | Admitting: Family Medicine

## 2018-08-12 DIAGNOSIS — M79605 Pain in left leg: Secondary | ICD-10-CM | POA: Diagnosis not present

## 2018-08-12 DIAGNOSIS — E039 Hypothyroidism, unspecified: Secondary | ICD-10-CM | POA: Diagnosis not present

## 2018-08-12 DIAGNOSIS — Z0000 Encounter for general adult medical examination without abnormal findings: Secondary | ICD-10-CM | POA: Diagnosis not present

## 2018-08-12 DIAGNOSIS — E782 Mixed hyperlipidemia: Secondary | ICD-10-CM | POA: Diagnosis not present

## 2018-08-12 DIAGNOSIS — I10 Essential (primary) hypertension: Secondary | ICD-10-CM | POA: Diagnosis not present

## 2018-08-12 DIAGNOSIS — M79662 Pain in left lower leg: Secondary | ICD-10-CM | POA: Diagnosis not present

## 2018-08-19 DIAGNOSIS — I10 Essential (primary) hypertension: Secondary | ICD-10-CM | POA: Diagnosis not present

## 2018-08-19 DIAGNOSIS — G4733 Obstructive sleep apnea (adult) (pediatric): Secondary | ICD-10-CM | POA: Diagnosis not present

## 2018-09-06 DIAGNOSIS — H52223 Regular astigmatism, bilateral: Secondary | ICD-10-CM | POA: Diagnosis not present

## 2018-09-06 DIAGNOSIS — H21233 Degeneration of iris (pigmentary), bilateral: Secondary | ICD-10-CM | POA: Diagnosis not present

## 2018-09-06 DIAGNOSIS — H5213 Myopia, bilateral: Secondary | ICD-10-CM | POA: Diagnosis not present

## 2018-09-06 DIAGNOSIS — H524 Presbyopia: Secondary | ICD-10-CM | POA: Diagnosis not present

## 2018-09-12 ENCOUNTER — Encounter (INDEPENDENT_AMBULATORY_CARE_PROVIDER_SITE_OTHER): Payer: 59

## 2018-10-02 ENCOUNTER — Encounter (INDEPENDENT_AMBULATORY_CARE_PROVIDER_SITE_OTHER): Payer: Self-pay | Admitting: Family Medicine

## 2018-10-02 ENCOUNTER — Ambulatory Visit (INDEPENDENT_AMBULATORY_CARE_PROVIDER_SITE_OTHER): Payer: 59 | Admitting: Family Medicine

## 2018-10-02 VITALS — BP 122/79 | HR 83 | Ht 63.0 in | Wt 249.0 lb

## 2018-10-02 DIAGNOSIS — Z9189 Other specified personal risk factors, not elsewhere classified: Secondary | ICD-10-CM | POA: Diagnosis not present

## 2018-10-02 DIAGNOSIS — E782 Mixed hyperlipidemia: Secondary | ICD-10-CM | POA: Diagnosis not present

## 2018-10-02 DIAGNOSIS — E559 Vitamin D deficiency, unspecified: Secondary | ICD-10-CM

## 2018-10-02 DIAGNOSIS — I10 Essential (primary) hypertension: Secondary | ICD-10-CM

## 2018-10-02 DIAGNOSIS — R5383 Other fatigue: Secondary | ICD-10-CM | POA: Diagnosis not present

## 2018-10-02 DIAGNOSIS — E8881 Metabolic syndrome: Secondary | ICD-10-CM

## 2018-10-02 DIAGNOSIS — R0602 Shortness of breath: Secondary | ICD-10-CM

## 2018-10-02 DIAGNOSIS — Z1331 Encounter for screening for depression: Secondary | ICD-10-CM | POA: Diagnosis not present

## 2018-10-02 DIAGNOSIS — Z0289 Encounter for other administrative examinations: Secondary | ICD-10-CM

## 2018-10-02 DIAGNOSIS — Z6841 Body Mass Index (BMI) 40.0 and over, adult: Secondary | ICD-10-CM | POA: Diagnosis not present

## 2018-10-02 NOTE — Progress Notes (Signed)
.  Office: [phone]  /  Fax: [phone]   HPI:   Chief Complaint: OBESITY  Katherine Smith (MR# [id number]) is a 58 y.o. female who presents on 10/02/2018 for obesity evaluation and treatment. She was a patient originally in 2017-2018 but got off track and is ready to restart. Current BMI is Body mass index is 44.11 kg/m.Marland Kitchen Katherine Smith has struggled with obesity for years and has been unsuccessful in either losing weight or maintaining long term weight loss. Katherine Smith attended our information session and states she is currently in the action stage of change and ready to dedicate time achieving and maintaining a healthier weight.  Katherine Smith states her family eats meals together she thinks her family will eat healthier with  her her desired weight loss is 100 lbs she has been heavy most of  her life she started gaining weight after the age of 3 her heaviest weight ever was 249 lbs. she has significant food cravings late in the evening she snacks frequently in the evenings she frequently makes poor food choices she frequently eats larger portions than normal when snacking she has binge eating behaviors she struggles with emotional eating    Fatigue Katherine Smith feels her energy is lower than it should be. This has worsened with weight gain and has worsened recently. Katherine Smith admits to daytime somnolence and admits to waking up still tired. Patient is at risk for obstructive sleep apnea. Katherine Smith has a history of symptoms of daytime fatigue, morning fatigue, and morning headache. Patient generally gets 5 hours of sleep per night, and states they generally do not sleep well most nights. Snoring is present. Apneic episodes are present. Epworth Sleepiness Score is 16.  Dyspnea on exertion Katherine Smith notes increasing shortness of breath with exercising and seems to be worsening over time with weight gain. She notes getting out of breath sooner with activity than she used to. This has gotten worse recently. Katherine Smith  denies orthopnea.  Hypertension Katherine Smith is a 58 y.o. female with hypertension. Her blood pressure is stable on medications.  Katherine Smith denies chest pain.She is working weight loss to help control her blood pressure with the goal of decreasing her risk of heart attack and stroke. Katherine Smith's blood pressure is currently controlled.  Hyperlipidemia (Mixed) Katherine Smith has hyperlipidemia, stable on Zetia, and has been trying to improve her cholesterol levels with intensive lifestyle modification including a low saturated fat diet, exercise, and weight loss.  She denies any chest pain or myalgias.  Vitamin D deficiency Katherine Smith has a diagnosis of Vitamin D deficiency with no recent labs. She is currently taking Vit D 5,000 daily. Positive fatigue.   Insulin Resistance Katherine Smith has a diagnosis of insulin resistance based on her elevated fasting insulin level >5. Although Katherine Smith's blood glucose readings are still under good control, insulin resistance puts her at greater risk of metabolic syndrome and diabetes. Katherine Smith had been on metformin in the past but no longer. Her last A1C was reported at 5.6.   At risk for diabetes Katherine Smith is at higher than averagerisk for developing diabetes due to her obesity. She currently denies polyuria or polydipsia.  Depression Screen Katherine Smith's Food and Mood (modified PHQ-9) score was  Depression screen PHQ 2/9 10/02/2018  Decreased Interest 2  Down, Depressed, Hopeless 1  PHQ - 2 Score 3  Altered sleeping 3  Tired, decreased energy 2  Change in appetite 2  Feeling bad or failure about yourself  1  Trouble concentrating 0  Moving slowly or  fidgety/restless 0  Suicidal thoughts 0  PHQ-9 Score 11  Difficult doing work/chores Not difficult at all   ASSESSMENT AND PLAN:  Other fatigue - Plan: EKG 12-Lead, Vitamin B12, Folate  Shortness of breath on exertion  Essential hypertension  Mixed hyperlipidemia  Vitamin D deficiency - Plan: VITAMIN D 25 Hydroxy  (Vit-D Deficiency, Fractures)  Insulin resistance - Plan: Comprehensive metabolic panel, Insulin, random  At risk for diabetes mellitus  Depression screening  Class 3 severe obesity with serious comorbidity and body mass index (BMI) of 40.0 to 44.9 in adult, unspecified obesity type ([place])  PLAN:  Fatigue Katherine Smith was informed that her fatigue may be related to obesity, depression or many other causes. Labs will be ordered, and in the meanwhile Katherine Smith has agreed to work on diet, exercise and weight loss to help with fatigue. Proper sleep hygiene was discussed including the need for 7-8 hours of quality sleep each night. A sleep study was not ordered based on symptoms and Epworth score.  Dyspnea on exertion Katherine Smith's shortness of breath appears to be obesity related and exercise induced. She has agreed to work on weight loss and gradually increase exercise to treat her exercise induced shortness of breath. If Katherine Smith follows our instructions and loses weight without improvement of her shortness of breath, we will plan to refer to pulmonology. We will monitor this condition regularly. Katherine Smith agrees to this plan.  Hypertension We discussed sodium restriction, working on healthy weight loss, and a regular exercise program as the means to achieve improved blood pressure control. Katherine Smith agreed with this plan and agreed to follow up as directed. We will continue to monitor her blood pressure as well as her progress with the above lifestyle modifications. She will continue her medications as prescribed and will watch for signs of hypotension as she continues her lifestyle modifications.  Hyperlipidemia (Mixed) Katherine Smith was informed of the American Heart Association Guidelines emphasizing intensive lifestyle modifications as the first line treatment for hyperlipidemia. We discussed many lifestyle modifications today in depth, and Katherine Smith will continue to work on decreasing saturated fats such as fatty red  meat, butter and many fried foods. She will also increase vegetables and lean protein in her diet and continue to work on exercise and weight loss efforts. Andreika will continue Zetia. We will recheck her labs in 2 months.  Vitamin D Deficiency Margaretann was informed that low Vitamin D levels contributes to fatigue and are associated with obesity, breast, and colon cancer. She will continue OTC Vitamin D and will follow-up for routine testing of Vitamin D, at least 2-3 times per year. She was informed of the risk of over-replacement of Vitamin D and agrees to not increase her dose unless she discusses this with [place] first. Akyah agrees to follow-up with our clinic in 2 weeks.  Insulin Resistance Katherine Smith will continue to work on weight loss, exercise, and decreasing simple carbohydrates in her diet to help decrease the risk of diabetes. We dicussed metformin including benefits and risks. She was informed that eating too many simple carbohydrates or too many calories at one sitting increases the likelihood of GI side effects. Ilda is not currently on metformin and prescription was not written today. We will check labs today and Deondrea has agreed to follow-up with [place] as directed to monitor her progress.  Diabetes risk counseling Katherine Smith was given extended (15 minutes) diabetes prevention counseling today. She is 58 y.o. female and has risk factors for diabetes including obesity. We discussed intensive lifestyle modifications  today with an emphasis on weight loss as well as increasing exercise and decreasing simple carbohydrates in her diet.  Depression Screen Katherine Smith had a moderately positive depression screening. Depression is commonly associated with obesity and often results in emotional eating behaviors. We will monitor this closely and work on CBT to help improve the non-hunger eating patterns. Referral to Psychology may be required if no improvement is seen as she continues in our clinic.  Obesity Katherine Smith  is currently in the action stage of change and her goal is to continue with weight loss efforts. She has agreed to follow the Category 3 plan. Zurisadai has been instructed to work up to a goal of 150 minutes of combined cardio and strengthening exercise per week for weight loss and overall health benefits. We discussed the following Behavioral Modification Strategies today: increasing lean protein intake, decreasing simple carbohydrates, and work on meal planning and easy cooking plans.  Mazel has agreed to follow-up with our clinic in 2 weeks. She was informed of the importance of frequent follow up visits to maximize her success with intensive lifestyle modifications for her multiple health conditions. She was informed we would discuss her lab results at her next visit unless there is a critical issue that needs to be addressed sooner. Jesselyn agreed to keep her next visit at the agreed upon time to discuss these results.  ALLERGIES: Allergies  Allergen Reactions  . Atorvastatin     Other reaction(s): Muscle Pain  . Dexfenfluramine     Other reaction(s): Unknown    MEDICATIONS: Current Outpatient Medications on File Prior to Visit  Medication Sig Dispense Refill  . Aspirin-Acetaminophen-Caffeine (EXCEDRIN MIGRAINE PO) Take 1 tablet by mouth daily as needed.    . Calcipotriene-Betameth Diprop (ENSTILAR) 0.005-0.064 % FOAM Apply topically.    . Cholecalciferol (VITAMIN D) 125 MCG (5000 UT) CAPS Take 1 capsule by mouth daily.    . clobetasol (TEMOVATE) 0.05 % external solution Apply 1 application topically 2 (two) times daily.    Marland Kitchen ezetimibe (ZETIA) 10 MG tablet TAKE 1 TABLET BY MOUTH ONCE DAILY.    Marland Kitchen levothyroxine (SYNTHROID, LEVOTHROID) 175 MCG tablet Take 175 mcg by mouth daily before breakfast.    . lisinopril-hydrochlorothiazide (PRINZIDE,ZESTORETIC) 10-12.5 MG tablet Take 1 tablet by mouth daily.    . Multiple Vitamins-Minerals (HAIR SKIN & NAILS ADVANCED PO) Take 1 tablet by mouth  daily.    . naproxen sodium (ANAPROX) 220 MG tablet Take 220 mg by mouth daily as needed.    . pantoprazole (PROTONIX) 40 MG tablet TAKE ONE TABLET BY MOUTH ONCE DAILY    . traZODone (DESYREL) 50 MG tablet Take 50 mg by mouth at bedtime as needed for sleep.    . famciclovir (FAMVIR) 500 MG tablet Take 1 tablet (500 mg total) 3 (three) times daily by mouth. (Patient not taking: Reported on 09/14/2017) 21 tablet 0   No current facility-administered medications on file prior to visit.     PAST MEDICAL HISTORY: Past Medical History:  Diagnosis Date  . Anxiety   . Back pain   . Cancer (HCC)    BASAL CELL SKIN CANCER  . Depression   . Fatty liver   . GERD (gastroesophageal reflux disease)   . Hashimoto's thyroiditis   . HTN (hypertension)   . Hyperlipidemia   . Hypothyroidism   . IBS (irritable bowel syndrome)   . Insomnia   . Joint pain   . Lactose intolerance   . Lichen sclerosus   . Obesity   .  Psoriasis   . Sleep apnea     PAST SURGICAL HISTORY: Past Surgical History:  Procedure Laterality Date  . ANTERIOR DISC ARTHROPLASTY     2004  . COLONOSCOPY  12/18/2011  . COLONOSCOPY WITH PROPOFOL N/A 04/04/2017   Procedure: COLONOSCOPY WITH PROPOFOL;  Surgeon: Manya Silvas, MD;  Location: [facility] ENDOSCOPY;  Service: Endoscopy;  Laterality: N/A;    SOCIAL HISTORY: Social History   Tobacco Use  . Smoking status: Never Smoker  . Smokeless tobacco: Never Used  Substance Use Topics  . Alcohol use: No  . Drug use: No    FAMILY HISTORY: Family History  Problem Relation Age of Onset  . Hypertension Mother   . Hyperlipidemia Mother   . Hypertension Father   . Breast cancer Neg Hx    ROS: Review of Systems  Constitutional: Positive for malaise/fatigue.       Positive for trouble sleeping.  HENT:       Positive for nose stuffiness.  Eyes:       Positive for wears glasses or contacts.  Respiratory: Negative for shortness of breath.   Cardiovascular: Negative for chest  pain and orthopnea.  Musculoskeletal: Positive for back pain and myalgias.       Positive for neck stiffness. Positive for muscle stiffness. Positive for leg cramps.  Skin: Positive for itching.       Positive for dryness.  Neurological: Positive for headaches.   PHYSICAL EXAM: Blood pressure 122/79, pulse 83, height 5\' 3"  (1.6 m), weight 249 lb (112.9 kg), last menstrual period 04/14/2016, SpO2 99 %. Body mass index is 44.11 kg/m. Physical Exam Vitals signs reviewed.  Constitutional:      Appearance: Normal appearance. She is well-developed. She is obese.  HENT:     Head: Normocephalic and atraumatic.     Nose: Nose normal.  Eyes:     General: No scleral icterus. Neck:     Musculoskeletal: Normal range of motion. Neck rigidity present.  Cardiovascular:     Rate and Rhythm: Normal rate and regular rhythm.  Pulmonary:     Effort: Pulmonary effort is normal. No respiratory distress.  Abdominal:     Palpations: Abdomen is soft.     Tenderness: There is no abdominal tenderness.  Musculoskeletal: Normal range of motion.     Comments: Range of motion normal in all four extremities.  Skin:    General: Skin is warm and dry.  Neurological:     Mental Status: She is alert and oriented to person, place, and time.     Coordination: Coordination normal.  Psychiatric:        Mood and Affect: Mood and affect normal.        Behavior: Behavior normal.   RECENT LABS AND TESTS: BMET    Component Value Date/Time   NA 141 11/23/2016 0832   K 4.6 11/23/2016 0832   CL 100 11/23/2016 0832   CO2 24 11/23/2016 0832   GLUCOSE 102 (H) 11/23/2016 0832   BUN 21 11/23/2016 0832   CREATININE 0.73 11/23/2016 0832   CALCIUM 9.8 11/23/2016 0832   GFRNONAA 92 11/23/2016 0832   GFRAA 106 11/23/2016 0832   Lab Results  Component Value Date   HGBA1C 5.1 11/23/2016   Lab Results  Component Value Date   INSULIN 27.7 (H) 11/23/2016   CBC    Component Value Date/Time   WBC 9.0 08/10/2016  1556   RBC 4.93 08/10/2016 1556   HGB 13.7 08/10/2016 1556   HCT 41.3  08/10/2016 1556   MCV 84 08/10/2016 1556   MCH 27.8 08/10/2016 1556   MCHC 33.2 08/10/2016 1556   RDW 14.0 08/10/2016 1556   LYMPHSABS 2.1 08/10/2016 1556   EOSABS 0.2 08/10/2016 1556   BASOSABS 0.1 08/10/2016 1556   Iron/TIBC/Ferritin/ %Sat No results found for: IRON, TIBC, FERRITIN, IRONPCTSAT Lipid Panel     Component Value Date/Time   CHOL 187 11/23/2016 0832   TRIG 101 11/23/2016 0832   HDL 66 11/23/2016 0832   LDLCALC 101 (H) 11/23/2016 0832   Hepatic Function Panel     Component Value Date/Time   PROT 7.0 11/23/2016 0832   ALBUMIN 4.4 11/23/2016 0832   AST 20 11/23/2016 0832   ALT 23 11/23/2016 0832   ALKPHOS 81 11/23/2016 0832   BILITOT 0.5 11/23/2016 0832      Component Value Date/Time   TSH 0.011 (L) 11/23/2016 4010    Ref. Range 11/23/2016 08:32  Vitamin D, 25-Hydroxy Latest Ref Range: 30.0 - 100.0 ng/mL 29.9 (L)   ECG shows decreasing R-wave progression - may be secondary to pulmonary disease,   consider old anterior infarct, low voltage - possible pulmonary disease with a rate of 88. INDIRECT CALORIMETER done today shows a VO2 of 369 and a REE of 2566. Her calculated basal metabolic rate is 2725 thus her basal metabolic rate is better than expected.  OBESITY BEHAVIORAL INTERVENTION VISIT  Today's visit was #1  Starting weight: 249 lbs Starting date: 10/02/2018 Today's weight: 249 lbs Today's date: 10/02/2018 Total lbs lost to date: 0    Most Recent Value 10/09/2013 - 10/08/2018  Height 5\' 3"  (1.6 m) 10/02/2018  Weight 249 lb (112.9 kg) 10/02/2018  BMI (Calculated) 44.12 10/02/2018  BLOOD PRESSURE - SYSTOLIC 366 4/40/3474  BLOOD PRESSURE - DIASTOLIC 79 2/59/5638  Waist Measurement  43 inches 08/10/2016   Body Fat % 47.6 % 10/02/2018  Total Body Water (lbs) 86.8 lbs 10/02/2018  RMR 2566 10/02/2018   ASK: We discussed the diagnosis of obesity with Tobin Chad today and  Olin Hauser agreed to give [place] permission to discuss obesity behavioral modification therapy today.  ASSESS: Karra has the diagnosis of obesity and her BMI today is 44.11. Ariannah is in the action stage of change.   ADVISE: Quinci was educated on the multiple health risks of obesity as well as the benefit of weight loss to improve her health. She was advised of the need for long term treatment and the importance of lifestyle modifications to improve her current health and to decrease her risk of future health problems.  AGREE: Multiple dietary modification options and treatment options were discussed and  Esta agreed to follow the recommendations documented in the above note.  ARRANGE: Barbera was educated on the importance of frequent visits to treat obesity as outlined per CMS and USPSTF guidelines and agreed to schedule her next follow up appointment today.   I, Michaelene Song, am acting as Location manager for Dennard Nip, MD  I have reviewed the above documentation for accuracy and completeness, and I agree with the above. -Dennard Nip, MD

## 2018-10-04 LAB — COMPREHENSIVE METABOLIC PANEL
ALBUMIN: 4.7 g/dL (ref 3.8–4.9)
ALK PHOS: 94 IU/L (ref 39–117)
ALT: 36 IU/L — ABNORMAL HIGH (ref 0–32)
AST: 32 IU/L (ref 0–40)
Albumin/Globulin Ratio: 1.7 (ref 1.2–2.2)
BUN / CREAT RATIO: 26 — AB (ref 9–23)
BUN: 23 mg/dL (ref 6–24)
Bilirubin Total: 0.4 mg/dL (ref 0.0–1.2)
CALCIUM: 9.9 mg/dL (ref 8.7–10.2)
CO2: 20 mmol/L (ref 20–29)
CREATININE: 0.9 mg/dL (ref 0.57–1.00)
Chloride: 99 mmol/L (ref 96–106)
GFR calc Af Amer: 82 mL/min/{1.73_m2} (ref >59)
GFR, EST NON AFRICAN AMERICAN: 71 mL/min/{1.73_m2} (ref >59)
GLOBULIN, TOTAL: 2.8 g/dL (ref 1.5–4.5)
GLUCOSE: 109 mg/dL — AB (ref 65–99)
Potassium: 4.7 mmol/L (ref 3.5–5.2)
Sodium: 140 mmol/L (ref 134–144)
Total Protein: 7.5 g/dL (ref 6.0–8.5)

## 2018-10-04 LAB — VITAMIN D 25 HYDROXY (VIT D DEFICIENCY, FRACTURES): Vit D, 25-Hydroxy: 27 ng/mL — ABNORMAL LOW (ref 30.0–100.0)

## 2018-10-04 LAB — INSULIN, RANDOM: INSULIN: 46.2 u[IU]/mL — ABNORMAL HIGH (ref 2.6–24.9)

## 2018-10-04 LAB — VITAMIN B12: Vitamin B-12: 264 pg/mL (ref 232–1245)

## 2018-10-04 LAB — FOLATE: Folate: 12.7 ng/mL (ref >3.0)

## 2018-10-16 ENCOUNTER — Ambulatory Visit (INDEPENDENT_AMBULATORY_CARE_PROVIDER_SITE_OTHER): Payer: 59 | Admitting: Family Medicine

## 2018-10-16 ENCOUNTER — Encounter (INDEPENDENT_AMBULATORY_CARE_PROVIDER_SITE_OTHER): Payer: Self-pay | Admitting: Family Medicine

## 2018-10-16 VITALS — BP 110/73 | HR 83 | Ht 63.0 in | Wt 244.0 lb

## 2018-10-16 DIAGNOSIS — Z9189 Other specified personal risk factors, not elsewhere classified: Secondary | ICD-10-CM

## 2018-10-16 DIAGNOSIS — E559 Vitamin D deficiency, unspecified: Secondary | ICD-10-CM

## 2018-10-16 DIAGNOSIS — Z6841 Body Mass Index (BMI) 40.0 and over, adult: Secondary | ICD-10-CM | POA: Diagnosis not present

## 2018-10-16 DIAGNOSIS — R7303 Prediabetes: Secondary | ICD-10-CM

## 2018-10-16 MED ORDER — VITAMIN D (ERGOCALCIFEROL) 1.25 MG (50000 UNIT) PO CAPS: 50000.0000 [IU] | ORAL_CAPSULE | ORAL | 0 refills | Status: DC

## 2018-10-16 MED ORDER — LIRAGLUTIDE -WEIGHT MANAGEMENT 18 MG/3ML ~~LOC~~ SOPN: 3.0000 mg | PEN_INJECTOR | Freq: Every day | SUBCUTANEOUS | 0 refills | Status: DC

## 2018-10-16 MED ORDER — INSULIN PEN NEEDLE 32G X 4 MM MISC: 1.0000 | Freq: Two times a day (BID) | 0 refills | Status: DC

## 2018-10-17 NOTE — Progress Notes (Signed)
Office: [phone]  /  Fax: [phone]   HPI:   Chief Complaint: OBESITY Katherine Smith is here to discuss her progress with her obesity treatment plan. She is on the Category 3 plan and is following her eating plan approximately 90 % of the time. She states she is exercising 0 minutes 0 times per week. Katherine Smith did very well with weight loss on her Category 3 plan, but struggled with evening hunger and cravings especially for salty carbohydrates.  Her weight is 244 lb (110.7 kg) today and has had a weight loss of 5 pounds over a period of 2 weeks since her last visit. She has lost 0 lbs since starting treatment with [place].  Vitamin D Deficiency Katherine Smith has a diagnosis of vitamin D deficiency. Her Vit D level is low and she is not on Vit D. She notes fatigue and denies nausea, vomiting or muscle weakness.  Pre-Diabetes Katherine Smith has a diagnosis of pre-diabetes, and fasting glucose and insulin are elevated. She was informed this puts her at greater risk of developing diabetes. She notes evening polyphagia and she had been on metformin in the past. She continues to work on diet and exercise to decrease risk of diabetes. She denies nausea or hypoglycemia.  At risk for diabetes Katherine Smith is at higher than average risk for developing diabetes due to her obesity and pre-diabetes. She currently denies polyuria or polydipsia.  ASSESSMENT AND PLAN:  Vitamin D deficiency - Plan: Vitamin D, Ergocalciferol, (DRISDOL) 1.25 MG (50000 UT) CAPS capsule  Prediabetes  At risk for diabetes mellitus  Class 3 severe obesity with serious comorbidity and body mass index (BMI) of 40.0 to 44.9 in adult, unspecified obesity type (Katherine Smith) - Plan: Liraglutide -Weight Management (SAXENDA) 18 MG/3ML SOPN, Insulin Pen Needle (BD PEN NEEDLE NANO 2ND GEN) 32G X 4 MM MISC  PLAN:  Vitamin D Deficiency Katherine Smith was informed that low vitamin D levels contributes to fatigue and are associated with obesity, breast, and colon cancer. Katherine Smith  agrees to start prescription Vit D @50 ,000 IU every week #4 with no refills. She will follow up for routine testing of vitamin D, at least 2-3 times per year. She was informed of the risk of over-replacement of vitamin D and agrees to not increase her dose unless she discusses this with [place] first. We will recheck labs in 3 months. Katherine Smith agrees to follow up with our clinic in 2 to 3 weeks.  Pre-Diabetes Katherine Smith will continue to work on weight loss, diet, exercise, and decreasing simple carbohydrates in her diet to help decrease the risk of diabetes. We dicussed metformin including benefits and risks. She was informed that eating too many simple carbohydrates or too many calories at one sitting increases the likelihood of GI side effects. Katherine Smith agrees to start GLP-1 as noted above and we will recheck labs in 3 months. Katherine Smith agrees to follow up with our clinic in 2 to 3 weeks as directed to monitor her progress.  Diabetes risk counseling Katherine Smith was given extended (30 minutes) diabetes prevention counseling today. She is 58 y.o. female and has risk factors for diabetes including obesity and pre-diabetes. We discussed intensive lifestyle modifications today with an emphasis on weight loss as well as increasing exercise and decreasing simple carbohydrates in her diet.  Obesity Katherine Smith is currently in the action stage of change. As such, her goal is to continue with weight loss efforts She has agreed to follow the Category 3 plan Katherine Smith has been instructed to work up to a  goal of 150 minutes of combined cardio and strengthening exercise per week for weight loss and overall health benefits. We discussed the following Behavioral Modification Strategies today: increasing lean protein and decreasing simple carbohydrates We discussed various medication options to help Katherine Smith with her weight loss efforts and we both agreed to start Saxenda 5.0 mg SubQ daily #5 pens with no refill (Pt at 3 mg daily), and nano  needles #100 with no refill.  Karmel has agreed to follow up with our clinic in 2 to 3 weeks. She was informed of the importance of frequent follow up visits to maximize her success with intensive lifestyle modifications for her multiple health conditions.  ALLERGIES: Allergies  Allergen Reactions  . Atorvastatin     Other reaction(s): Muscle Pain  . Dexfenfluramine     Other reaction(s): Unknown    MEDICATIONS: Current Outpatient Medications on File Prior to Visit  Medication Sig Dispense Refill  . Aspirin-Acetaminophen-Caffeine (EXCEDRIN MIGRAINE PO) Take 1 tablet by mouth daily as needed.    . Calcipotriene-Betameth Diprop (ENSTILAR) 0.005-0.064 % FOAM Apply topically.    . Cholecalciferol (VITAMIN D) 125 MCG (5000 UT) CAPS Take 1 capsule by mouth daily.    . clobetasol (TEMOVATE) 0.05 % external solution Apply 1 application topically 2 (two) times daily.    Marland Kitchen ezetimibe (ZETIA) 10 MG tablet TAKE 1 TABLET BY MOUTH ONCE DAILY.    . famciclovir (FAMVIR) 500 MG tablet Take 1 tablet (500 mg total) 3 (three) times daily by mouth. 21 tablet 0  . levothyroxine (SYNTHROID, LEVOTHROID) 175 MCG tablet Take 175 mcg by mouth daily before breakfast.    . lisinopril-hydrochlorothiazide (PRINZIDE,ZESTORETIC) 10-12.5 MG tablet Take 1 tablet by mouth daily.    . Multiple Vitamins-Minerals (HAIR SKIN & NAILS ADVANCED PO) Take 1 tablet by mouth daily.    . naproxen sodium (ANAPROX) 220 MG tablet Take 220 mg by mouth daily as needed.    . pantoprazole (PROTONIX) 40 MG tablet TAKE ONE TABLET BY MOUTH ONCE DAILY    . traZODone (DESYREL) 50 MG tablet Take 50 mg by mouth at bedtime as needed for sleep.     No current facility-administered medications on file prior to visit.     PAST MEDICAL HISTORY: Past Medical History:  Diagnosis Date  . Anxiety   . Back pain   . Cancer (HCC)    BASAL CELL SKIN CANCER  . Depression   . Fatty liver   . GERD (gastroesophageal reflux disease)   . Hashimoto's  thyroiditis   . HTN (hypertension)   . Hyperlipidemia   . Hypothyroidism   . IBS (irritable bowel syndrome)   . Insomnia   . Joint pain   . Lactose intolerance   . Lichen sclerosus   . Obesity   . Psoriasis   . Sleep apnea     PAST SURGICAL HISTORY: Past Surgical History:  Procedure Laterality Date  . ANTERIOR DISC ARTHROPLASTY     2004  . COLONOSCOPY  12/18/2011  . COLONOSCOPY WITH PROPOFOL N/A 04/04/2017   Procedure: COLONOSCOPY WITH PROPOFOL;  Surgeon: Manya Silvas, MD;  Location: [facility] ENDOSCOPY;  Service: Endoscopy;  Laterality: N/A;    SOCIAL HISTORY: Social History   Tobacco Use  . Smoking status: Never Smoker  . Smokeless tobacco: Never Used  Substance Use Topics  . Alcohol use: No  . Drug use: No    FAMILY HISTORY: Family History  Problem Relation Age of Onset  . Hypertension Mother   . Hyperlipidemia Mother   .  Hypertension Father   . Breast cancer Neg Hx     ROS: Review of Systems  Constitutional: Positive for malaise/fatigue and weight loss.  Gastrointestinal: Negative for nausea and vomiting.  Genitourinary: Negative for frequency.  Musculoskeletal:       Negative muscle weakness  Endo/Heme/Allergies: Negative for polydipsia.       Positive polyphagia Negative hypoglycemia    PHYSICAL EXAM: Blood pressure 110/73, pulse 83, height 5\' 3"  (1.6 m), weight 244 lb (110.7 kg), last menstrual period 04/14/2016, SpO2 98 %. Body mass index is 43.22 kg/m. Physical Exam Vitals signs reviewed.  Constitutional:      Appearance: Normal appearance. She is obese.  Cardiovascular:     Rate and Rhythm: Normal rate.     Pulses: Normal pulses.  Pulmonary:     Effort: Pulmonary effort is normal.     Breath sounds: Normal breath sounds.  Musculoskeletal: Normal range of motion.  Skin:    General: Skin is warm and dry.  Neurological:     Mental Status: She is alert and oriented to person, place, and time.  Psychiatric:        Mood and Affect: Mood  normal.        Behavior: Behavior normal.     RECENT LABS AND TESTS: BMET    Component Value Date/Time   NA 140 10/02/2018 0831   K 4.7 10/02/2018 0831   CL 99 10/02/2018 0831   CO2 20 10/02/2018 0831   GLUCOSE 109 (H) 10/02/2018 0831   BUN 23 10/02/2018 0831   CREATININE 0.90 10/02/2018 0831   CALCIUM 9.9 10/02/2018 0831   GFRNONAA 71 10/02/2018 0831   GFRAA 82 10/02/2018 0831   Lab Results  Component Value Date   HGBA1C 5.1 11/23/2016   HGBA1C 5.3 08/10/2016   Lab Results  Component Value Date   INSULIN 46.2 (H) 10/02/2018   INSULIN 27.7 (H) 11/23/2016   INSULIN 38.9 (H) 08/10/2016   CBC    Component Value Date/Time   WBC 9.0 08/10/2016 1556   RBC 4.93 08/10/2016 1556   HGB 13.7 08/10/2016 1556   HCT 41.3 08/10/2016 1556   MCV 84 08/10/2016 1556   MCH 27.8 08/10/2016 1556   MCHC 33.2 08/10/2016 1556   RDW 14.0 08/10/2016 1556   LYMPHSABS 2.1 08/10/2016 1556   EOSABS 0.2 08/10/2016 1556   BASOSABS 0.1 08/10/2016 1556   Iron/TIBC/Ferritin/ %Sat No results found for: IRON, TIBC, FERRITIN, IRONPCTSAT Lipid Panel     Component Value Date/Time   CHOL 187 11/23/2016 0832   TRIG 101 11/23/2016 0832   HDL 66 11/23/2016 0832   LDLCALC 101 (H) 11/23/2016 0832   Hepatic Function Panel     Component Value Date/Time   PROT 7.5 10/02/2018 0831   ALBUMIN 4.7 10/02/2018 0831   AST 32 10/02/2018 0831   ALT 36 (H) 10/02/2018 0831   ALKPHOS 94 10/02/2018 0831   BILITOT 0.4 10/02/2018 0831      Component Value Date/Time   TSH 0.011 (L) 11/23/2016 0832   TSH 1.170 08/10/2016 1556      OBESITY BEHAVIORAL INTERVENTION VISIT  Today's visit was # 14   Starting weight: 240 lbs Starting date: 08/10/16 Today's weight : 244 lbs Today's date: 10/16/2018 Total lbs lost to date: 0    10/16/2018  Height 5\' 3"  (1.6 m)  Weight 244 lb (110.7 kg)  BMI (Calculated) 43.23  BLOOD PRESSURE - SYSTOLIC 035  BLOOD PRESSURE - DIASTOLIC 73   Body Fat % 00.9 %  Total Body  Water (lbs) 86.2 lbs     ASK: We discussed the diagnosis of obesity with Tobin Chad today and [place] agreed to give [place] permission to discuss obesity behavioral modification therapy today.  ASSESS: Chrystie has the diagnosis of obesity and her BMI today is 43.23 Nakaila is in the action stage of change   ADVISE: Derek was educated on the multiple health risks of obesity as well as the benefit of weight loss to improve her health. She was advised of the need for long term treatment and the importance of lifestyle modifications to improve her current health and to decrease her risk of future health problems.  AGREE: Multiple dietary modification options and treatment options were discussed and  Lucca agreed to follow the recommendations documented in the above note.  ARRANGE: Odessie was educated on the importance of frequent visits to treat obesity as outlined per CMS and USPSTF guidelines and agreed to schedule her next follow up appointment today.  I, Trixie Dredge, am acting as transcriptionist for Dennard Nip, MD  I have reviewed the above documentation for accuracy and completeness, and I agree with the above. -Dennard Nip, MD

## 2018-10-21 ENCOUNTER — Encounter (INDEPENDENT_AMBULATORY_CARE_PROVIDER_SITE_OTHER): Payer: Self-pay

## 2018-11-05 ENCOUNTER — Encounter (INDEPENDENT_AMBULATORY_CARE_PROVIDER_SITE_OTHER): Payer: Self-pay

## 2018-11-06 ENCOUNTER — Encounter (INDEPENDENT_AMBULATORY_CARE_PROVIDER_SITE_OTHER): Payer: Self-pay | Admitting: Family Medicine

## 2018-11-06 ENCOUNTER — Ambulatory Visit (INDEPENDENT_AMBULATORY_CARE_PROVIDER_SITE_OTHER): Payer: 59 | Admitting: Family Medicine

## 2018-11-06 ENCOUNTER — Other Ambulatory Visit: Payer: Self-pay

## 2018-11-06 DIAGNOSIS — E559 Vitamin D deficiency, unspecified: Secondary | ICD-10-CM

## 2018-11-06 DIAGNOSIS — Z6841 Body Mass Index (BMI) 40.0 and over, adult: Secondary | ICD-10-CM

## 2018-11-06 MED ORDER — VITAMIN D (ERGOCALCIFEROL) 1.25 MG (50000 UNIT) PO CAPS: 50000.0000 [IU] | ORAL_CAPSULE | ORAL | 0 refills | Status: DC

## 2018-11-07 ENCOUNTER — Encounter (INDEPENDENT_AMBULATORY_CARE_PROVIDER_SITE_OTHER): Payer: Self-pay

## 2018-11-07 NOTE — Progress Notes (Signed)
Office: [phone]  /  Fax: [phone] TeleHealth Visit:  Katherine Smith has consented to this TeleHealth visit today via face time. The patient is located at home, the provider is located at the [employer] [facility] office. The participants in this visit include the listed provider and patient and provider's assistant.   HPI:   Chief Complaint: OBESITY Katherine Smith is here to discuss her progress with her obesity treatment plan. She is on the Category 3 plan and is following her eating plan approximately 85 % of the time. She states she is exercising 0 minutes 0 times per week. Katherine Smith has done well with decreasing snacking due to being on Saxenda. She is on 1.2 mg and is having some nausea. She has done well with meal planning and prepping. We were unable to weight the patient today for this TeleHealth visit. She feels as if she has lost about 6 lbs since her last visit. She has lost 0 lbs since starting treatment with [place].  Vitamin D Deficiency Katherine Smith has a diagnosis of vitamin D deficiency. She is stable on prescription Vit D, but level is not yet at goal. She notes fatigue is improving and denies nausea, vomiting or muscle weakness.  ASSESSMENT AND PLAN:  Vitamin D deficiency - Plan: Vitamin D, Ergocalciferol, (DRISDOL) 1.25 MG (50000 UT) CAPS capsule  Class 3 severe obesity with serious comorbidity and body mass index (BMI) of 40.0 to 44.9 in adult, unspecified obesity type ([place])  PLAN:  Vitamin D Deficiency Katherine Smith was informed that low vitamin D levels contributes to fatigue and are associated with obesity, breast, and colon cancer. Una agrees to continue taking prescription Vit D @50 ,000 IU every week #4 and we will refill for 1 month. She will follow up for routine testing of vitamin D, at least 2-3 times per year. She was informed of the risk of over-replacement of vitamin D and agrees to not increase her dose unless she discusses this with [place] first. Katherine Smith agrees to  follow up with our clinic in 3 weeks.  Obesity Katherine Smith is currently in the action stage of change. As such, her goal is to continue with weight loss efforts She has agreed to follow the Category 3 plan Katherine Smith has been instructed to work up to a goal of 150 minutes of combined cardio and strengthening exercise per week or start walking for 15 minutes per day for weight loss and overall health benefits. We discussed the following Behavioral Modification Strategies today: increasing lean protein intake, decreasing simple carbohydrates  and work on meal planning and easy cooking plans We discussed various medication options to help Katherine Smith with her weight loss efforts and we both agreed to continue Saxenda at 1.2 mg and is not to increase for now.  Katherine Smith has agreed to follow up with our clinic in 3 weeks. She was informed of the importance of frequent follow up visits to maximize her success with intensive lifestyle modifications for her multiple health conditions.  ALLERGIES: Allergies  Allergen Reactions   Atorvastatin     Other reaction(s): Muscle Pain   Dexfenfluramine     Other reaction(s): Unknown    MEDICATIONS: Current Outpatient Medications on File Prior to Visit  Medication Sig Dispense Refill   Aspirin-Acetaminophen-Caffeine (EXCEDRIN MIGRAINE PO) Take 1 tablet by mouth daily as needed.     Calcipotriene-Betameth Diprop (ENSTILAR) 0.005-0.064 % FOAM Apply topically.     Cholecalciferol (VITAMIN D) 125 MCG (5000 UT) CAPS Take 1 capsule by mouth daily.  clobetasol (TEMOVATE) 0.05 % external solution Apply 1 application topically 2 (two) times daily.     ezetimibe (ZETIA) 10 MG tablet TAKE 1 TABLET BY MOUTH ONCE DAILY.     famciclovir (FAMVIR) 500 MG tablet Take 1 tablet (500 mg total) 3 (three) times daily by mouth. 21 tablet 0   Insulin Pen Needle (BD PEN NEEDLE NANO 2ND GEN) 32G X 4 MM MISC 1 Package by Does not apply route 2 (two) times daily. 100 each 0    levothyroxine (SYNTHROID, LEVOTHROID) 175 MCG tablet Take 175 mcg by mouth daily before breakfast.     Liraglutide -Weight Management (SAXENDA) 18 MG/3ML SOPN Inject 3 mg into the skin daily. 5 pen 0   lisinopril-hydrochlorothiazide (PRINZIDE,ZESTORETIC) 10-12.5 MG tablet Take 1 tablet by mouth daily.     Multiple Vitamins-Minerals (HAIR SKIN & NAILS ADVANCED PO) Take 1 tablet by mouth daily.     naproxen sodium (ANAPROX) 220 MG tablet Take 220 mg by mouth daily as needed.     pantoprazole (PROTONIX) 40 MG tablet TAKE ONE TABLET BY MOUTH ONCE DAILY     traZODone (DESYREL) 50 MG tablet Take 50 mg by mouth at bedtime as needed for sleep.     No current facility-administered medications on file prior to visit.     PAST MEDICAL HISTORY: Past Medical History:  Diagnosis Date   Anxiety    Back pain    Cancer ([place])    BASAL CELL SKIN CANCER   Depression    Fatty liver    GERD (gastroesophageal reflux disease)    Hashimoto's thyroiditis    HTN (hypertension)    Hyperlipidemia    Hypothyroidism    IBS (irritable bowel syndrome)    Insomnia    Joint pain    Lactose intolerance    Lichen sclerosus    Obesity    Psoriasis    Sleep apnea     PAST SURGICAL HISTORY: Past Surgical History:  Procedure Laterality Date   ANTERIOR DISC ARTHROPLASTY     2004   COLONOSCOPY  12/18/2011   COLONOSCOPY WITH PROPOFOL N/A 04/04/2017   Procedure: COLONOSCOPY WITH PROPOFOL;  Surgeon: Manya Silvas, MD;  Location: [facility] ENDOSCOPY;  Service: Endoscopy;  Laterality: N/A;    SOCIAL HISTORY: Social History   Tobacco Use   Smoking status: Never Smoker   Smokeless tobacco: Never Used  Substance Use Topics   Alcohol use: No   Drug use: No    FAMILY HISTORY: Family History  Problem Relation Age of Onset   Hypertension Mother    Hyperlipidemia Mother    Hypertension Father    Breast cancer Neg Hx     ROS: Review of Systems  Constitutional: Positive for  malaise/fatigue and weight loss.  Gastrointestinal: Negative for nausea and vomiting.  Musculoskeletal:       Negative muscle weakness    PHYSICAL EXAM: Pt in no acute distress  RECENT LABS AND TESTS: BMET    Component Value Date/Time   NA 140 10/02/2018 0831   K 4.7 10/02/2018 0831   CL 99 10/02/2018 0831   CO2 20 10/02/2018 0831   GLUCOSE 109 (H) 10/02/2018 0831   BUN 23 10/02/2018 0831   CREATININE 0.90 10/02/2018 0831   CALCIUM 9.9 10/02/2018 0831   GFRNONAA 71 10/02/2018 0831   GFRAA 82 10/02/2018 0831   Lab Results  Component Value Date   HGBA1C 5.1 11/23/2016   HGBA1C 5.3 08/10/2016   Lab Results  Component Value Date  INSULIN 46.2 (H) 10/02/2018   INSULIN 27.7 (H) 11/23/2016   INSULIN 38.9 (H) 08/10/2016   CBC    Component Value Date/Time   WBC 9.0 08/10/2016 1556   RBC 4.93 08/10/2016 1556   HGB 13.7 08/10/2016 1556   HCT 41.3 08/10/2016 1556   MCV 84 08/10/2016 1556   MCH 27.8 08/10/2016 1556   MCHC 33.2 08/10/2016 1556   RDW 14.0 08/10/2016 1556   LYMPHSABS 2.1 08/10/2016 1556   EOSABS 0.2 08/10/2016 1556   BASOSABS 0.1 08/10/2016 1556   Iron/TIBC/Ferritin/ %Sat No results found for: IRON, TIBC, FERRITIN, IRONPCTSAT Lipid Panel     Component Value Date/Time   CHOL 187 11/23/2016 0832   TRIG 101 11/23/2016 0832   HDL 66 11/23/2016 0832   LDLCALC 101 (H) 11/23/2016 0832   Hepatic Function Panel     Component Value Date/Time   PROT 7.5 10/02/2018 0831   ALBUMIN 4.7 10/02/2018 0831   AST 32 10/02/2018 0831   ALT 36 (H) 10/02/2018 0831   ALKPHOS 94 10/02/2018 0831   BILITOT 0.4 10/02/2018 0831      Component Value Date/Time   TSH 0.011 (L) 11/23/2016 0832   TSH 1.170 08/10/2016 1556      I, Trixie Dredge, am acting as transcriptionist for Dennard Nip, MD I have reviewed the above documentation for accuracy and completeness, and I agree with the above. -Dennard Nip, MD

## 2018-11-18 DIAGNOSIS — I10 Essential (primary) hypertension: Secondary | ICD-10-CM | POA: Diagnosis not present

## 2018-11-18 DIAGNOSIS — G4733 Obstructive sleep apnea (adult) (pediatric): Secondary | ICD-10-CM | POA: Diagnosis not present

## 2018-11-25 ENCOUNTER — Encounter (INDEPENDENT_AMBULATORY_CARE_PROVIDER_SITE_OTHER): Payer: Self-pay | Admitting: Family Medicine

## 2018-11-25 ENCOUNTER — Ambulatory Visit (INDEPENDENT_AMBULATORY_CARE_PROVIDER_SITE_OTHER): Payer: 59 | Admitting: Family Medicine

## 2018-11-25 ENCOUNTER — Other Ambulatory Visit: Payer: Self-pay

## 2018-11-25 DIAGNOSIS — Z6841 Body Mass Index (BMI) 40.0 and over, adult: Secondary | ICD-10-CM | POA: Diagnosis not present

## 2018-11-25 DIAGNOSIS — K581 Irritable bowel syndrome with constipation: Secondary | ICD-10-CM

## 2018-11-25 NOTE — Progress Notes (Signed)
Office: [phone]  /  Fax: [phone] TeleHealth Visit:  Katherine Smith has verbally consented to this TeleHealth visit today. The patient is located at home, the provider is located at the [employer] [facility] office. The participants in this visit include the listed provider and patient. The visit was conducted today via Face Time.  HPI:   Chief Complaint: OBESITY Katherine Smith is here to discuss her progress with her obesity treatment plan. She is on the Category 3 plan and is following her eating plan approximately 80 % of the time. She states she is exercising 0 minutes 0 times per week. Katherine Smith feels that she is doing well with her plan and has lost 2 to 3 pounds since our last visit. Her hunger is controlled, but cravings are still a problem, especially on days that she doesn't work. Her daily steps have decreased, so she is doing more things around the house to increase steps. Katherine Smith is doing well on Saxenda.  We were unable to weigh the patient today for this TeleHealth visit. She feels as if she has lost weight since her last visit. She has lost 0 lbs since starting treatment with [place].  Irritable Bowel Syndrome with Constipation Predominant Katherine Smith felt very constipated 2 weeks ago on Saxenda, so she stopped taking it for 3 days and felt better. She has increased fiber and water intake. She has restarted Saxenda and is doing well.  ASSESSMENT AND PLAN:  Irritable bowel syndrome with constipation  Class 3 severe obesity with serious comorbidity and body mass index (BMI) of 40.0 to 44.9 in adult, unspecified obesity type ([place])  PLAN:  Irritable Bowel Syndrome with Constipation Predominant Katherine Smith agrees to continue increasing fiber and she will continue to monitor Saxenda for now. She agrees to follow up in 2 weeks as directed.  I spent > than 50% of the 15 minute visit on counseling as documented in the note.  Obesity Katherine Smith is currently in the action stage of change. As  such, her goal is to continue with weight loss efforts. She has agreed to follow the Category 3 plan. Katherine Smith has been instructed to work up to a goal of 150 minutes of combined cardio and strengthening exercise per week for weight loss and overall health benefits. We discussed the following Behavioral Modification Strategies today: increasing fiber rich foods, emotional eating strategies, keeping healthy foods in the home, increase H2O intake, and ways to avoid boredom eating.  Katherine Smith has agreed to follow up with our clinic in 2 weeks. She was informed of the importance of frequent follow up visits to maximize her success with intensive lifestyle modifications for her multiple health conditions.  ALLERGIES: Allergies  Allergen Reactions  . Atorvastatin     Other reaction(s): Muscle Pain  . Dexfenfluramine     Other reaction(s): Unknown    MEDICATIONS: Current Outpatient Medications on File Prior to Visit  Medication Sig Dispense Refill  . Aspirin-Acetaminophen-Caffeine (EXCEDRIN MIGRAINE PO) Take 1 tablet by mouth daily as needed.    . Calcipotriene-Betameth Diprop (ENSTILAR) 0.005-0.064 % FOAM Apply topically.    . Cholecalciferol (VITAMIN D) 125 MCG (5000 UT) CAPS Take 1 capsule by mouth daily.    . clobetasol (TEMOVATE) 0.05 % external solution Apply 1 application topically 2 (two) times daily.    Marland Kitchen ezetimibe (ZETIA) 10 MG tablet TAKE 1 TABLET BY MOUTH ONCE DAILY.    . famciclovir (FAMVIR) 500 MG tablet Take 1 tablet (500 mg total) 3 (three) times daily by mouth.  21 tablet 0  . Insulin Pen Needle (BD PEN NEEDLE NANO 2ND GEN) 32G X 4 MM MISC 1 Package by Does not apply route 2 (two) times daily. 100 each 0  . levothyroxine (SYNTHROID, LEVOTHROID) 175 MCG tablet Take 175 mcg by mouth daily before breakfast.    . Liraglutide -Weight Management (SAXENDA) 18 MG/3ML SOPN Inject 3 mg into the skin daily. 5 pen 0  . lisinopril-hydrochlorothiazide (PRINZIDE,ZESTORETIC) 10-12.5 MG tablet Take 1  tablet by mouth daily.    . Multiple Vitamins-Minerals (HAIR SKIN & NAILS ADVANCED PO) Take 1 tablet by mouth daily.    . naproxen sodium (ANAPROX) 220 MG tablet Take 220 mg by mouth daily as needed.    . pantoprazole (PROTONIX) 40 MG tablet TAKE ONE TABLET BY MOUTH ONCE DAILY    . traZODone (DESYREL) 50 MG tablet Take 50 mg by mouth at bedtime as needed for sleep.    . Vitamin D, Ergocalciferol, (DRISDOL) 1.25 MG (50000 UT) CAPS capsule Take 1 capsule (50,000 Units total) by mouth every 7 (seven) days. 4 capsule 0   No current facility-administered medications on file prior to visit.     PAST MEDICAL HISTORY: Past Medical History:  Diagnosis Date  . Anxiety   . Back pain   . Cancer (HCC)    BASAL CELL SKIN CANCER  . Depression   . Fatty liver   . GERD (gastroesophageal reflux disease)   . Hashimoto's thyroiditis   . HTN (hypertension)   . Hyperlipidemia   . Hypothyroidism   . IBS (irritable bowel syndrome)   . Insomnia   . Joint pain   . Lactose intolerance   . Lichen sclerosus   . Obesity   . Psoriasis   . Sleep apnea     PAST SURGICAL HISTORY: Past Surgical History:  Procedure Laterality Date  . ANTERIOR DISC ARTHROPLASTY     2004  . COLONOSCOPY  12/18/2011  . COLONOSCOPY WITH PROPOFOL N/A 04/04/2017   Procedure: COLONOSCOPY WITH PROPOFOL;  Surgeon: Manya Silvas, MD;  Location: [facility] ENDOSCOPY;  Service: Endoscopy;  Laterality: N/A;    SOCIAL HISTORY: Social History   Tobacco Use  . Smoking status: Never Smoker  . Smokeless tobacco: Never Used  Substance Use Topics  . Alcohol use: No  . Drug use: No    FAMILY HISTORY: Family History  Problem Relation Age of Onset  . Hypertension Mother   . Hyperlipidemia Mother   . Hypertension Father   . Breast cancer Neg Hx     ROS: Review of Systems  Gastrointestinal: Positive for constipation.    PHYSICAL EXAM: Pt in no acute distress  RECENT LABS AND TESTS: BMET    Component Value Date/Time   NA  140 10/02/2018 0831   K 4.7 10/02/2018 0831   CL 99 10/02/2018 0831   CO2 20 10/02/2018 0831   GLUCOSE 109 (H) 10/02/2018 0831   BUN 23 10/02/2018 0831   CREATININE 0.90 10/02/2018 0831   CALCIUM 9.9 10/02/2018 0831   GFRNONAA 71 10/02/2018 0831   GFRAA 82 10/02/2018 0831   Lab Results  Component Value Date   HGBA1C 5.1 11/23/2016   HGBA1C 5.3 08/10/2016   Lab Results  Component Value Date   INSULIN 46.2 (H) 10/02/2018   INSULIN 27.7 (H) 11/23/2016   INSULIN 38.9 (H) 08/10/2016   CBC    Component Value Date/Time   WBC 9.0 08/10/2016 1556   RBC 4.93 08/10/2016 1556   HGB 13.7 08/10/2016 1556  HCT 41.3 08/10/2016 1556   MCV 84 08/10/2016 1556   MCH 27.8 08/10/2016 1556   MCHC 33.2 08/10/2016 1556   RDW 14.0 08/10/2016 1556   LYMPHSABS 2.1 08/10/2016 1556   EOSABS 0.2 08/10/2016 1556   BASOSABS 0.1 08/10/2016 1556   Iron/TIBC/Ferritin/ %Sat No results found for: IRON, TIBC, FERRITIN, IRONPCTSAT Lipid Panel     Component Value Date/Time   CHOL 187 11/23/2016 0832   TRIG 101 11/23/2016 0832   HDL 66 11/23/2016 0832   LDLCALC 101 (H) 11/23/2016 0832   Hepatic Function Panel     Component Value Date/Time   PROT 7.5 10/02/2018 0831   ALBUMIN 4.7 10/02/2018 0831   AST 32 10/02/2018 0831   ALT 36 (H) 10/02/2018 0831   ALKPHOS 94 10/02/2018 0831   BILITOT 0.4 10/02/2018 0831      Component Value Date/Time   TSH 0.011 (L) 11/23/2016 0832   TSH 1.170 08/10/2016 1556    Results for TEKISHA, DARCEY (MRN [id number]) as of 11/25/2018 11:26  Ref. Range 10/02/2018 08:31  Vitamin D, 25-Hydroxy Latest Ref Range: 30.0 - 100.0 ng/mL 27.0 (L)    I, Marcille Blanco, CMA, am acting as transcriptionist for Starlyn Skeans, MD I have reviewed the above documentation for accuracy and completeness, and I agree with the above. -Dennard Nip, MD

## 2018-12-06 DIAGNOSIS — E782 Mixed hyperlipidemia: Secondary | ICD-10-CM | POA: Diagnosis not present

## 2018-12-06 DIAGNOSIS — I10 Essential (primary) hypertension: Secondary | ICD-10-CM | POA: Diagnosis not present

## 2018-12-06 DIAGNOSIS — E039 Hypothyroidism, unspecified: Secondary | ICD-10-CM | POA: Diagnosis not present

## 2018-12-12 DIAGNOSIS — M62830 Muscle spasm of back: Secondary | ICD-10-CM | POA: Diagnosis not present

## 2018-12-12 DIAGNOSIS — E782 Mixed hyperlipidemia: Secondary | ICD-10-CM | POA: Diagnosis not present

## 2018-12-12 DIAGNOSIS — E039 Hypothyroidism, unspecified: Secondary | ICD-10-CM | POA: Diagnosis not present

## 2018-12-12 DIAGNOSIS — I10 Essential (primary) hypertension: Secondary | ICD-10-CM | POA: Diagnosis not present

## 2018-12-16 ENCOUNTER — Ambulatory Visit (INDEPENDENT_AMBULATORY_CARE_PROVIDER_SITE_OTHER): Payer: Self-pay | Admitting: Family Medicine

## 2018-12-19 ENCOUNTER — Other Ambulatory Visit: Payer: Self-pay

## 2018-12-19 ENCOUNTER — Encounter (INDEPENDENT_AMBULATORY_CARE_PROVIDER_SITE_OTHER): Payer: Self-pay | Admitting: Bariatrics

## 2018-12-19 ENCOUNTER — Ambulatory Visit (INDEPENDENT_AMBULATORY_CARE_PROVIDER_SITE_OTHER): Payer: 59 | Admitting: Bariatrics

## 2018-12-19 DIAGNOSIS — E559 Vitamin D deficiency, unspecified: Secondary | ICD-10-CM | POA: Diagnosis not present

## 2018-12-19 DIAGNOSIS — I10 Essential (primary) hypertension: Secondary | ICD-10-CM | POA: Diagnosis not present

## 2018-12-19 DIAGNOSIS — Z6841 Body Mass Index (BMI) 40.0 and over, adult: Secondary | ICD-10-CM | POA: Diagnosis not present

## 2018-12-19 MED ORDER — VITAMIN D (ERGOCALCIFEROL) 1.25 MG (50000 UNIT) PO CAPS: 50000.0000 [IU] | ORAL_CAPSULE | ORAL | 0 refills | Status: DC

## 2018-12-19 MED ORDER — LIRAGLUTIDE -WEIGHT MANAGEMENT 18 MG/3ML ~~LOC~~ SOPN: 3.0000 mg | PEN_INJECTOR | Freq: Every day | SUBCUTANEOUS | 0 refills | Status: DC

## 2018-12-23 NOTE — Progress Notes (Signed)
Office: [phone]  /  Fax: [phone] TeleHealth Visit:  Katherine Smith has verbally consented to this TeleHealth visit today. The patient is located at home, the provider is located at the [employer] [facility] office. The participants in this visit include the listed provider and patient and any and all parties involved. The visit was conducted today via FaceTime.  HPI:   Chief Complaint: OBESITY Katherine Smith is here to discuss her progress with her obesity treatment plan. She is on the Category 3 plan and is following her eating plan approximately 60 % of the time. She states she is exercising 0 minutes 0 times per week. Katherine Smith states that she has maintained weight (weight 236 lbs). She is on Saxenda, but she had constipation at one point, but then this resolved and she is doing better. We were unable to weigh the patient today for this TeleHealth visit. She feels as if she has maintained weight since her last visit. She has lost 4 lbs since starting treatment with [place].  Vitamin D deficiency Katherine Smith has a diagnosis of vitamin D deficiency. Her last vitamin D level was at 27.0 She is currently taking vit D and denies nausea, vomiting or muscle weakness.  Hypertension Katherine Smith is a 58 y.o. female with hypertension. She is taking Lisinopril-HCTZ. Katherine Smith denies chest pain or shortness of breath on exertion. She is working weight loss to help control her blood pressure with the goal of decreasing her risk of heart attack and stroke. [name] blood pressure is well controlled.  ASSESSMENT AND PLAN:  Vitamin D deficiency - Plan: Vitamin D, Ergocalciferol, (DRISDOL) 1.25 MG (50000 UT) CAPS capsule  Essential hypertension  Class 3 severe obesity with serious comorbidity and body mass index (BMI) of 40.0 to 44.9 in adult, unspecified obesity type (Katherine Smith) - Plan: Liraglutide -Weight Management (SAXENDA) 18 MG/3ML SOPN  PLAN:  Vitamin D Deficiency Katherine Smith was informed that  low vitamin D levels contributes to fatigue and are associated with obesity, breast, and colon cancer. She agrees to continue to take prescription Vit D @50 ,000 IU every week #4 with no refills and will follow up for routine testing of vitamin D, at least 2-3 times per year. She was informed of the risk of over-replacement of vitamin D and agrees to not increase her dose unless she discusses this with [place] first. Katherine Smith agrees to follow up as directed.  Hypertension We discussed sodium restriction, working on healthy weight loss, and a regular exercise program as the means to achieve improved blood pressure control. Katherine Smith agreed with this plan and agreed to follow up as directed. We will continue to monitor her blood pressure as well as her progress with the above lifestyle modifications. She will continue her medications as prescribed and will watch for signs of hypotension as she continues her lifestyle modifications.  Obesity Katherine Smith is currently in the action stage of change. As such, her goal is to continue with weight loss efforts She has agreed to follow the Category 3 plan Katherine Smith has been instructed to work up to a goal of 150 minutes of combined cardio and strengthening exercise per week for weight loss and overall health benefits. We discussed the following Behavioral Modification Strategies today: planning for success, increase H2O intake, no skipping meals, keeping healthy foods in the home, increasing lean protein intake, decreasing simple carbohydrates, increasing vegetables, decrease eating out and work on meal planning and easy cooking plans Katherine Smith will weigh herself at home before each visit.  Katherine Smith agreed to continue Saxenda 1.8 mg, inject 3 mg into skin daily (dose 1.2 mg) #5 pens with no refills and   Katherine Smith has agreed to follow up with our clinic in 2 weeks. She was informed of the importance of frequent follow up visits to maximize her success with intensive lifestyle modifications  for her multiple health conditions.  ALLERGIES: Allergies  Allergen Reactions  . Atorvastatin     Other reaction(s): Muscle Pain  . Dexfenfluramine     Other reaction(s): Unknown    MEDICATIONS: Current Outpatient Medications on File Prior to Visit  Medication Sig Dispense Refill  . levothyroxine (SYNTHROID) 150 MCG tablet Take 150 mcg by mouth daily before breakfast.    . Aspirin-Acetaminophen-Caffeine (EXCEDRIN MIGRAINE PO) Take 1 tablet by mouth daily as needed.    . Calcipotriene-Betameth Diprop (ENSTILAR) 0.005-0.064 % FOAM Apply topically.    . Cholecalciferol (VITAMIN D) 125 MCG (5000 UT) CAPS Take 1 capsule by mouth daily.    . clobetasol (TEMOVATE) 0.05 % external solution Apply 1 application topically 2 (two) times daily.    Marland Kitchen ezetimibe (ZETIA) 10 MG tablet TAKE 1 TABLET BY MOUTH ONCE DAILY.    . famciclovir (FAMVIR) 500 MG tablet Take 1 tablet (500 mg total) 3 (three) times daily by mouth. 21 tablet 0  . Insulin Pen Needle (BD PEN NEEDLE NANO 2ND GEN) 32G X 4 MM MISC 1 Package by Does not apply route 2 (two) times daily. 100 each 0  . lisinopril-hydrochlorothiazide (PRINZIDE,ZESTORETIC) 10-12.5 MG tablet Take 1 tablet by mouth daily.    . Multiple Vitamins-Minerals (HAIR SKIN & NAILS ADVANCED PO) Take 1 tablet by mouth daily.    . naproxen sodium (ANAPROX) 220 MG tablet Take 220 mg by mouth daily as needed.    . pantoprazole (PROTONIX) 40 MG tablet TAKE ONE TABLET BY MOUTH ONCE DAILY    . traZODone (DESYREL) 50 MG tablet Take 50 mg by mouth at bedtime as needed for sleep.     No current facility-administered medications on file prior to visit.     PAST MEDICAL HISTORY: Past Medical History:  Diagnosis Date  . Anxiety   . Back pain   . Cancer (HCC)    BASAL CELL SKIN CANCER  . Depression   . Fatty liver   . GERD (gastroesophageal reflux disease)   . Hashimoto's thyroiditis   . HTN (hypertension)   . Hyperlipidemia   . Hypothyroidism   . IBS (irritable bowel  syndrome)   . Insomnia   . Joint pain   . Lactose intolerance   . Lichen sclerosus   . Obesity   . Psoriasis   . Sleep apnea     PAST SURGICAL HISTORY: Past Surgical History:  Procedure Laterality Date  . ANTERIOR DISC ARTHROPLASTY     2004  . COLONOSCOPY  12/18/2011  . COLONOSCOPY WITH PROPOFOL N/A 04/04/2017   Procedure: COLONOSCOPY WITH PROPOFOL;  Surgeon: Manya Silvas, MD;  Location: [facility] ENDOSCOPY;  Service: Endoscopy;  Laterality: N/A;    SOCIAL HISTORY: Social History   Tobacco Use  . Smoking status: Never Smoker  . Smokeless tobacco: Never Used  Substance Use Topics  . Alcohol use: No  . Drug use: No    FAMILY HISTORY: Family History  Problem Relation Age of Onset  . Hypertension Mother   . Hyperlipidemia Mother   . Hypertension Father   . Breast cancer Neg Hx     ROS: Review of Systems  Constitutional: Negative for weight  loss.  Respiratory: Negative for shortness of breath (on exertion).   Cardiovascular: Negative for chest pain.  Gastrointestinal: Negative for nausea and vomiting.  Musculoskeletal:       Negative for muscle weakness    PHYSICAL EXAM: Pt in no acute distress  RECENT LABS AND TESTS: BMET    Component Value Date/Time   NA 140 10/02/2018 0831   K 4.7 10/02/2018 0831   CL 99 10/02/2018 0831   CO2 20 10/02/2018 0831   GLUCOSE 109 (H) 10/02/2018 0831   BUN 23 10/02/2018 0831   CREATININE 0.90 10/02/2018 0831   CALCIUM 9.9 10/02/2018 0831   GFRNONAA 71 10/02/2018 0831   GFRAA 82 10/02/2018 0831   Lab Results  Component Value Date   HGBA1C 5.1 11/23/2016   HGBA1C 5.3 08/10/2016   Lab Results  Component Value Date   INSULIN 46.2 (H) 10/02/2018   INSULIN 27.7 (H) 11/23/2016   INSULIN 38.9 (H) 08/10/2016   CBC    Component Value Date/Time   WBC 9.0 08/10/2016 1556   RBC 4.93 08/10/2016 1556   HGB 13.7 08/10/2016 1556   HCT 41.3 08/10/2016 1556   MCV 84 08/10/2016 1556   MCH 27.8 08/10/2016 1556   MCHC 33.2  08/10/2016 1556   RDW 14.0 08/10/2016 1556   LYMPHSABS 2.1 08/10/2016 1556   EOSABS 0.2 08/10/2016 1556   BASOSABS 0.1 08/10/2016 1556   Iron/TIBC/Ferritin/ %Sat No results found for: IRON, TIBC, FERRITIN, IRONPCTSAT Lipid Panel     Component Value Date/Time   CHOL 187 11/23/2016 0832   TRIG 101 11/23/2016 0832   HDL 66 11/23/2016 0832   LDLCALC 101 (H) 11/23/2016 0832   Hepatic Function Panel     Component Value Date/Time   PROT 7.5 10/02/2018 0831   ALBUMIN 4.7 10/02/2018 0831   AST 32 10/02/2018 0831   ALT 36 (H) 10/02/2018 0831   ALKPHOS 94 10/02/2018 0831   BILITOT 0.4 10/02/2018 0831      Component Value Date/Time   TSH 0.011 (L) 11/23/2016 0832   TSH 1.170 08/10/2016 1556     Ref. Range 10/02/2018 08:31  Vitamin D, 25-Hydroxy Latest Ref Range: 30.0 - 100.0 ng/mL 27.0 (L)    I, Doreene Nest, am acting as Location manager for [employer]. Owens Shark, DO  I have reviewed the above documentation for accuracy and completeness, and I agree with the above. -Jearld Lesch, DO

## 2019-01-07 ENCOUNTER — Other Ambulatory Visit: Payer: Self-pay

## 2019-01-07 ENCOUNTER — Emergency Department
Admission: EM | Admit: 2019-01-07 | Discharge: 2019-01-07 | Disposition: A | Discharge status: Home / Self Care | Payer: 59 | Attending: Emergency Medicine | Admitting: Emergency Medicine

## 2019-01-07 ENCOUNTER — Encounter: Payer: Self-pay | Admitting: Emergency Medicine

## 2019-01-07 ENCOUNTER — Emergency Department: Payer: 59

## 2019-01-07 ENCOUNTER — Encounter (INDEPENDENT_AMBULATORY_CARE_PROVIDER_SITE_OTHER): Payer: Self-pay | Admitting: Family Medicine

## 2019-01-07 ENCOUNTER — Ambulatory Visit (INDEPENDENT_AMBULATORY_CARE_PROVIDER_SITE_OTHER): Payer: 59 | Admitting: Family Medicine

## 2019-01-07 DIAGNOSIS — G510 Bell's palsy: Secondary | ICD-10-CM | POA: Insufficient documentation

## 2019-01-07 DIAGNOSIS — R51 Headache: Secondary | ICD-10-CM | POA: Diagnosis not present

## 2019-01-07 DIAGNOSIS — E039 Hypothyroidism, unspecified: Secondary | ICD-10-CM | POA: Insufficient documentation

## 2019-01-07 DIAGNOSIS — E66813 Obesity, class 3: Secondary | ICD-10-CM

## 2019-01-07 DIAGNOSIS — Z79899 Other long term (current) drug therapy: Secondary | ICD-10-CM | POA: Diagnosis not present

## 2019-01-07 DIAGNOSIS — I10 Essential (primary) hypertension: Secondary | ICD-10-CM | POA: Diagnosis not present

## 2019-01-07 DIAGNOSIS — Z85828 Personal history of other malignant neoplasm of skin: Secondary | ICD-10-CM | POA: Insufficient documentation

## 2019-01-07 DIAGNOSIS — Z6841 Body Mass Index (BMI) 40.0 and over, adult: Secondary | ICD-10-CM

## 2019-01-07 DIAGNOSIS — R200 Anesthesia of skin: Secondary | ICD-10-CM | POA: Diagnosis present

## 2019-01-07 DIAGNOSIS — Z794 Long term (current) use of insulin: Secondary | ICD-10-CM | POA: Insufficient documentation

## 2019-01-07 LAB — CBC
HCT: 38.9 % (ref 36.0–46.0)
Hemoglobin: 12.8 g/dL (ref 12.0–15.0)
MCH: 28.2 pg (ref 26.0–34.0)
MCHC: 32.9 g/dL (ref 30.0–36.0)
MCV: 85.7 fL (ref 80.0–100.0)
Platelets: 334 K/uL (ref 150–400)
RBC: 4.54 MIL/uL (ref 3.87–5.11)
RDW: 13.9 % (ref 11.5–15.5)
WBC: 8.8 K/uL (ref 4.0–10.5)
nRBC: 0 % (ref 0.0–0.2)

## 2019-01-07 LAB — COMPREHENSIVE METABOLIC PANEL WITH GFR
ALT: 33 U/L (ref 0–44)
AST: 31 U/L (ref 15–41)
Albumin: 4.3 g/dL (ref 3.5–5.0)
Alkaline Phosphatase: 81 U/L (ref 38–126)
Anion gap: 10 (ref 5–15)
BUN: 24 mg/dL — ABNORMAL HIGH (ref 6–20)
CO2: 22 mmol/L (ref 22–32)
Calcium: 9.5 mg/dL (ref 8.9–10.3)
Chloride: 104 mmol/L (ref 98–111)
Creatinine, Ser: 0.86 mg/dL (ref 0.44–1.00)
GFR calc Af Amer: >60 mL/min
GFR calc non Af Amer: >60 mL/min
Glucose, Bld: 110 mg/dL — ABNORMAL HIGH (ref 70–99)
Potassium: 4.1 mmol/L (ref 3.5–5.1)
Sodium: 136 mmol/L (ref 135–145)
Total Bilirubin: 0.6 mg/dL (ref 0.3–1.2)
Total Protein: 7.9 g/dL (ref 6.5–8.1)

## 2019-01-07 MED ORDER — PREDNISONE 20 MG PO TABS: 20.0000 mg | ORAL_TABLET | Freq: Three times a day (TID) | ORAL | 0 refills | Status: AC

## 2019-01-07 MED ORDER — VALACYCLOVIR HCL 1 G PO TABS: 1000.0000 mg | ORAL_TABLET | Freq: Three times a day (TID) | ORAL | 0 refills | Status: AC

## 2019-01-07 NOTE — Discharge Instructions (Addendum)
Take 60 mg of prednisone daily for the next week. Take Valtrex 3 times daily for the next week. Return to the emergency department with new or worsening symptoms.

## 2019-01-07 NOTE — ED Provider Notes (Signed)
Home patient with no signs or symptoms suggest central process.  She has mild left-sided facial nerve palsy.  Isolation to the 7th cranial nerve on the left side only.  She has normal strength with no pronator drift in all extremities.   She is fully awake alert.  Clear speech.  No nausea vomiting.  CT head is normal.  Exam and clinical history are consistent with Bell's.  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  Patient plans to fill her prednisone and will consider whether or not to initiate Valtrex, I think this is reasonable and she does not have any evidence of active HSV infection.  Medical screening examination/treatment/procedure(s) were conducted as a shared visit with non-physician practitioner(s) and myself.  I personally evaluated the patient during the encounter.     Delman Kitten, MD 01/07/19 1218

## 2019-01-07 NOTE — Progress Notes (Deleted)
Office: [phone]  /  Fax: [phone] TeleHealth Visit:  [name] has verbally consented to this TeleHealth visit today. The patient is located at home, the provider is located at the [facility] office. The participants in this visit include the listed provider and patient. The visit was conducted today via audio.  HPI:   Chief Complaint: OBESITY Katherine Smith is here to discuss her progress with her obesity treatment plan. She is on the  follow the Category 2 plan and is following her eating plan approximately 50 % of the time. She states she is exercising 0 minutes 0 times per week. Katherine Smith feels she may have gained weight since her last visit. She has been eating more snacks late at night. She is doing her foot pedal exercising 5-10 minutes most days for exercise.    We were unable to weigh the patient today for this TeleHealth visit. She feels as if she has gained weight since her last visit. She has lost 4 lbs since starting treatment with [place].  Hypertension Katherine Smith is a 58 y.o. female with hypertension.  Katherine Smith denies chest pain or shortness of breath on exertion. She is working weight loss to help control her blood pressure with the goal of decreasing her risk of heart attack and stroke. Katherine Smith blood pressure has increased and she states it was 186/94 on Monday at home with her wrist monitor. She took her blood pressure right after doing her foot pedal exercises though.    ASSESSMENT AND PLAN:  Bell's palsy  Class 3 severe obesity with serious comorbidity and body mass index (BMI) of 40.0 to 44.9 in adult, unspecified obesity type ([place])  PLAN: Hypertension We discussed sodium restriction, working on healthy weight loss, and a regular exercise program as the means to achieve improved blood pressure control. [name] agreed with this plan and agreed to follow up as directed. We will continue to monitor her blood pressure as well as her progress  with the above lifestyle modifications. She will continue her medications as prescribed and will watch for signs of hypotension as she continues her lifestyle modifications. She was educated on the proper way to take her blood pressure at home. She agreed and will follow up with new readings in 3 weeks.   I spent > than 50% of the 15 minute visit on counseling as documented in the note.  Obesity Katherine Smith is currently in the action stage of change. As such, her goal is to continue with weight loss efforts She has agreed to follow the Category 2 plan Katherine Smith has been instructed to work up to a goal of 150 minutes of combined cardio and strengthening exercise per week for weight loss and overall health benefits. We discussed the following Behavioral Modification Stratagies today: decreasing sodium intake, increase water intake, ways to avoid boredom eating and ways to avoid night time snacking   Katherine Smith has agreed to follow up with our clinic in 2 weeks. She was informed of the importance of frequent follow up visits to maximize her success with intensive lifestyle modifications for her multiple health conditions.  ALLERGIES: Allergies  Allergen Reactions  . Atorvastatin     Other reaction(s): Muscle Pain  . Dexfenfluramine     Other reaction(s): Unknown    MEDICATIONS: Current Outpatient Medications on File Prior to Visit  Medication Sig Dispense Refill  . Aspirin-Acetaminophen-Caffeine (EXCEDRIN MIGRAINE PO) Take 1 tablet by mouth daily as needed.    . Calcipotriene-Betameth Diprop (  ENSTILAR) 0.005-0.064 % FOAM Apply topically.    . Cholecalciferol (VITAMIN D) 125 MCG (5000 UT) CAPS Take 1 capsule by mouth daily.    . clobetasol (TEMOVATE) 0.05 % external solution Apply 1 application topically 2 (two) times daily.    Marland Kitchen ezetimibe (ZETIA) 10 MG tablet TAKE 1 TABLET BY MOUTH ONCE DAILY.    . famciclovir (FAMVIR) 500 MG tablet Take 1 tablet (500 mg total) 3 (three) times daily by mouth. 21  tablet 0  . Insulin Pen Needle (BD PEN NEEDLE NANO 2ND GEN) 32G X 4 MM MISC 1 Package by Does not apply route 2 (two) times daily. 100 each 0  . levothyroxine (SYNTHROID) 150 MCG tablet Take 150 mcg by mouth daily before breakfast.    . Liraglutide -Weight Management (SAXENDA) 18 MG/3ML SOPN Inject 3 mg into the skin daily. 5 pen 0  . lisinopril-hydrochlorothiazide (PRINZIDE,ZESTORETIC) 10-12.5 MG tablet Take 1 tablet by mouth daily.    . Multiple Vitamins-Minerals (HAIR SKIN & NAILS ADVANCED PO) Take 1 tablet by mouth daily.    . naproxen sodium (ANAPROX) 220 MG tablet Take 220 mg by mouth daily as needed.    . pantoprazole (PROTONIX) 40 MG tablet TAKE ONE TABLET BY MOUTH ONCE DAILY    . traZODone (DESYREL) 50 MG tablet Take 50 mg by mouth at bedtime as needed for sleep.    . Vitamin D, Ergocalciferol, (DRISDOL) 1.25 MG (50000 UT) CAPS capsule Take 1 capsule (50,000 Units total) by mouth every 7 (seven) days. 4 capsule 0   No current facility-administered medications on file prior to visit.     PAST MEDICAL HISTORY: Past Medical History:  Diagnosis Date  . Anxiety   . Back pain   . Cancer (HCC)    BASAL CELL SKIN CANCER  . Depression   . Fatty liver   . GERD (gastroesophageal reflux disease)   . Hashimoto's thyroiditis   . HTN (hypertension)   . Hyperlipidemia   . Hypothyroidism   . IBS (irritable bowel syndrome)   . Insomnia   . Joint pain   . Lactose intolerance   . Lichen sclerosus   . Obesity   . Psoriasis   . Sleep apnea     PAST SURGICAL HISTORY: Past Surgical History:  Procedure Laterality Date  . ANTERIOR DISC ARTHROPLASTY     2004  . COLONOSCOPY  12/18/2011  . COLONOSCOPY WITH PROPOFOL N/A 04/04/2017   Procedure: COLONOSCOPY WITH PROPOFOL;  Surgeon: Manya Silvas, MD;  Location[facility] ENDOSCOPY;  Service: Endoscopy;  Laterality: N/A;    SOCIAL HISTORY: Social History   Tobacco Use  . Smoking status: Never Smoker  . Smokeless tobacco: Never Used   Substance Use Topics  . Alcohol use: No  . Drug use: No    FAMILY HISTORY: Family History  Problem Relation Age of Onset  . Hypertension Mother   . Hyperlipidemia Mother   . Hypertension Father   . Breast cancer Neg Hx     ROS: Review of Systems  Respiratory: Negative for shortness of breath.   Cardiovascular: Negative for chest pain.    PHYSICAL EXAM: Pt in no acute distress  RECENT LABS AND TESTS: BMET    Component Value Date/Time   NA 136 01/07/2019 0930   NA 140 10/02/2018 0831   K 4.1 01/07/2019 0930   CL 104 01/07/2019 0930   CO2 22 01/07/2019 0930   GLUCOSE 110 (H) 01/07/2019 0930   BUN 24 (H) 01/07/2019 0930   BUN  23 10/02/2018 0831   CREATININE 0.86 01/07/2019 0930   CALCIUM 9.5 01/07/2019 0930   GFRNONAA >60 01/07/2019 0930   GFRAA >60 01/07/2019 0930   Lab Results  Component Value Date   HGBA1C 5.1 11/23/2016   HGBA1C 5.3 08/10/2016   Lab Results  Component Value Date   INSULIN 46.2 (H) 10/02/2018   INSULIN 27.7 (H) 11/23/2016   INSULIN 38.9 (H) 08/10/2016   CBC    Component Value Date/Time   WBC 8.8 01/07/2019 0930   RBC 4.54 01/07/2019 0930   HGB 12.8 01/07/2019 0930   HGB 13.7 08/10/2016 1556   HCT 38.9 01/07/2019 0930   HCT 41.3 08/10/2016 1556   PLT 334 01/07/2019 0930   MCV 85.7 01/07/2019 0930   MCV 84 08/10/2016 1556   MCH 28.2 01/07/2019 0930   MCHC 32.9 01/07/2019 0930   RDW 13.9 01/07/2019 0930   RDW 14.0 08/10/2016 1556   LYMPHSABS 2.1 08/10/2016 1556   EOSABS 0.2 08/10/2016 1556   BASOSABS 0.1 08/10/2016 1556   Iron/TIBC/Ferritin/ %Sat No results found for: IRON, TIBC, FERRITIN, IRONPCTSAT Lipid Panel     Component Value Date/Time   CHOL 187 11/23/2016 0832   TRIG 101 11/23/2016 0832   HDL 66 11/23/2016 0832   LDLCALC 101 (H) 11/23/2016 0832   Hepatic Function Panel     Component Value Date/Time   PROT 7.9 01/07/2019 0930   PROT 7.5 10/02/2018 0831   ALBUMIN 4.3 01/07/2019 0930   ALBUMIN 4.7 10/02/2018  0831   AST 31 01/07/2019 0930   ALT 33 01/07/2019 0930   ALKPHOS 81 01/07/2019 0930   BILITOT 0.6 01/07/2019 0930   BILITOT 0.4 10/02/2018 0831      Component Value Date/Time   TSH 0.011 (L) 11/23/2016 0832   TSH 1.170 08/10/2016 1556      I, Renee Ramus, am acting as Location manager for Dennard Nip, MD

## 2019-01-07 NOTE — ED Triage Notes (Signed)
Pt reports Saturday and Sunday had a HA on the left side of her head, yesterday has some drainage from her left eye and this morning when she was brushing her teeth at 06:45 she noticed numbness to the left side of her lips. NO drooping noted in triage, pt denies pain currently.

## 2019-01-07 NOTE — ED Provider Notes (Signed)
[facility] Emergency Department Provider Note  ____________________________________________  Time seen: Approximately 12:03 PM  I have reviewed the triage vital signs and the nursing notes.   HISTORY  Chief Complaint Numbness and Eye Drainage    HPI Katherine Smith is a 58 y.o. female  with a history of hypertension, hyperlipidemia and hypothyroidism, presents to the emergency department with increased tearing of the left eye, drooping of the mouth on the left side and left-sided forehead weakness that started yesterday.  Patient states that when she was brushing her teeth this morning, she noticed some numbness around the left side of her mouth.  She is also had a headache on the left side.  She denies blurry vision, vertigo, weakness of the upper or lower extremities or bowel or bladder incontinence.  She has been able to ambulate without difficulty.  Patient works as a Software engineer and wanted to make sure that her symptoms were consistent with Bell's palsy.  No other alleviating measures have been attempted.        Past Medical History:  Diagnosis Date  . Anxiety   . Back pain   . Cancer (HCC)    BASAL CELL SKIN CANCER  . Depression   . Fatty liver   . GERD (gastroesophageal reflux disease)   . Hashimoto's thyroiditis   . HTN (hypertension)   . Hyperlipidemia   . Hypothyroidism   . IBS (irritable bowel syndrome)   . Insomnia   . Joint pain   . Lactose intolerance   . Lichen sclerosus   . Obesity   . Psoriasis   . Sleep apnea     Patient Active Problem List   Diagnosis Date Noted  . Class 3 severe obesity with serious comorbidity and body mass index (BMI) of 40.0 to 44.9 in adult ([place]) 02/07/2017  . Elevated liver function tests 11/23/2016  . B12 nutritional deficiency 11/23/2016  . Essential hypertension 10/25/2016  . Prediabetes 10/25/2016  . Class 2 obesity without serious comorbidity with body mass index (BMI) of 37.0 to 37.9 in adult  10/25/2016  . Obesity (BMI 30-39.9) 09/26/2016  . Hashimoto's thyroiditis 09/07/2016  . Sleep apnea 09/07/2016  . Insulin resistance 08/24/2016  . Vitamin D deficiency 08/24/2016  . Hyperlipidemia 08/24/2016  . Morbid obesity ([place]) 08/24/2016    Past Surgical History:  Procedure Laterality Date  . ANTERIOR DISC ARTHROPLASTY     2004  . COLONOSCOPY  12/18/2011  . COLONOSCOPY WITH PROPOFOL N/A 04/04/2017   Procedure: COLONOSCOPY WITH PROPOFOL;  Surgeon: Manya Silvas, MD;  Location: [facility] ENDOSCOPY;  Service: Endoscopy;  Laterality: N/A;    Prior to Admission medications   Medication Sig Start Date End Date Taking? Authorizing Provider  Aspirin-Acetaminophen-Caffeine (EXCEDRIN MIGRAINE PO) Take 1 tablet by mouth daily as needed.    [provider]  Calcipotriene-Betameth Diprop (ENSTILAR) 0.005-0.064 % FOAM Apply topically.    [provider]  Cholecalciferol (VITAMIN D) 125 MCG (5000 UT) CAPS Take 1 capsule by mouth daily.    [provider]  clobetasol (TEMOVATE) 0.05 % external solution Apply 1 application topically 2 (two) times daily.    [provider]  ezetimibe (ZETIA) 10 MG tablet TAKE 1 TABLET BY MOUTH ONCE DAILY. 04/14/16   [provider]  famciclovir (FAMVIR) 500 MG tablet Take 1 tablet (500 mg total) 3 (three) times daily by mouth. 06/19/17   Versie Starks, PA-C  Insulin Pen Needle (BD PEN NEEDLE NANO 2ND GEN) 32G X 4 MM  MISC 1 Package by Does not apply route 2 (two) times daily. 10/16/18   Dennard Nip D, MD  levothyroxine (SYNTHROID) 150 MCG tablet Take 150 mcg by mouth daily before breakfast.    [provider]  Liraglutide -Weight Management (SAXENDA) 18 MG/3ML SOPN Inject 3 mg into the skin daily. 12/19/18   Georgia Lopes, DO  lisinopril-hydrochlorothiazide (PRINZIDE,ZESTORETIC) 10-12.5 MG tablet Take 1 tablet by mouth daily.    [provider]  Multiple Vitamins-Minerals (HAIR SKIN & NAILS ADVANCED PO)  Take 1 tablet by mouth daily.    [provider]  naproxen sodium (ANAPROX) 220 MG tablet Take 220 mg by mouth daily as needed.    [provider]  pantoprazole (PROTONIX) 40 MG tablet TAKE ONE TABLET BY MOUTH ONCE DAILY 12/30/15   [provider]  predniSONE (DELTASONE) 20 MG tablet Take 1 tablet (20 mg total) by mouth 3 (three) times daily for 7 days. 01/07/19 01/14/19  Lannie Fields, PA-C  traZODone (DESYREL) 50 MG tablet Take 50 mg by mouth at bedtime as needed for sleep.    [provider]  valACYclovir (VALTREX) 1000 MG tablet Take 1 tablet (1,000 mg total) by mouth 3 (three) times daily for 7 days. 01/07/19 01/14/19  Lannie Fields, PA-C  Vitamin D, Ergocalciferol, (DRISDOL) 1.25 MG (50000 UT) CAPS capsule Take 1 capsule (50,000 Units total) by mouth every 7 (seven) days. 12/19/18   Jearld Lesch A, DO    Allergies Atorvastatin and Dexfenfluramine  Family History  Problem Relation Age of Onset  . Hypertension Mother   . Hyperlipidemia Mother   . Hypertension Father   . Breast cancer Neg Hx     Social History Social History   Tobacco Use  . Smoking status: Never Smoker  . Smokeless tobacco: Never Used  Substance Use Topics  . Alcohol use: No  . Drug use: No     Review of Systems  Constitutional: No fever/chills Eyes: Patient had increased tearing on the left. No discharge ENT: No upper respiratory complaints. Cardiovascular: no chest pain. Respiratory: no cough. No SOB. Gastrointestinal: No abdominal pain.  No nausea, no vomiting.  No diarrhea.  No constipation. Musculoskeletal: Negative for musculoskeletal pain. Skin: Negative for rash, abrasions, lacerations, ecchymosis. Neurological: Patient had headache and asymmetrical forehead movement.    ____________________________________________   PHYSICAL EXAM:  VITAL SIGNS: ED Triage Vitals  Enc Vitals Group     BP 01/07/19 0926 132/75     Pulse Rate 01/07/19 0926 (!) 107     Resp  01/07/19 0926 20     Temp 01/07/19 0926 98.2 F (36.8 C)     Temp Source 01/07/19 0926 Oral     SpO2 01/07/19 0926 97 %     Weight 01/07/19 0917 236 lb (107 kg)     Height 01/07/19 0917 5\' 3"  (1.6 m)     Head Circumference --      Peak Flow --      Pain Score 01/07/19 0917 0     Pain Loc --      Pain Edu? --      Excl. in [place]? --      Constitutional: Alert and oriented. Well appearing and in no acute distress. Eyes: Conjunctivae are normal.  Patient has delayed eye closure with blinking on the left.  Patient can close her left eye.  PERRL. EOMI. Head: Atraumatic.  Patient has asymmetrical forehead movement with less movement on the left. ENT:  Ears: TMs are pearly.      Nose: No congestion/rhinnorhea.      Mouth/Throat: Mucous membranes are moist.  Patient has drooping on the left side of her mouth. Neck: No stridor.  No cervical spine tenderness to palpation. Cardiovascular: Normal rate, regular rhythm. Normal S1 and S2.  Good peripheral circulation. Respiratory: Normal respiratory effort without tachypnea or retractions. Lungs CTAB. Good air entry to the bases with no decreased or absent breath sounds. Gastrointestinal: Bowel sounds 4 quadrants. Soft and nontender to palpation. No guarding or rigidity. No palpable masses. No distention. No CVA tenderness. Musculoskeletal: Patient has 5 out of 5 strength in the upper and lower extremities bilaterally and symmetrically.  Full range of motion to all extremities. No gross deformities appreciated. Neurologic:  Normal speech and language.  Patient has normal sensation to light and deep touch. Skin:  Skin is warm, dry and intact. No rash noted. Psychiatric: Mood and affect are normal. Speech and behavior are normal. Patient exhibits appropriate insight and judgement.   ____________________________________________   LABS (all labs ordered are listed, but only abnormal results are displayed)  Labs Reviewed  COMPREHENSIVE METABOLIC  PANEL - Abnormal; Notable for the following components:      Result Value   Glucose, Bld 110 (*)    BUN 24 (*)    All other components within normal limits  CBC  CBG MONITORING, ED   ____________________________________________  EKG   ____________________________________________  RADIOLOGY I personally viewed and evaluated these images as part of my medical decision making, as well as reviewing the written report by the radiologist.  Ct Head Wo Contrast  Result Date: 01/07/2019 CLINICAL DATA:  Headache EXAM: CT HEAD WITHOUT CONTRAST TECHNIQUE: Contiguous axial images were obtained from the base of the skull through the vertex without intravenous contrast. COMPARISON:  None. FINDINGS: Brain: No acute intracranial abnormality. Specifically, no hemorrhage, hydrocephalus, mass lesion, acute infarction, or significant intracranial injury. Vascular: No hyperdense vessel or unexpected calcification. Skull: No acute calvarial abnormality. Sinuses/Orbits: Visualized paranasal sinuses and mastoids clear. Orbital soft tissues unremarkable. Other: None IMPRESSION: Normal study. Electronically Signed   By: Rolm Baptise M.D.   On: 01/07/2019 10:15    ____________________________________________    PROCEDURES  Procedure(s) performed:    Procedures    Medications - No data to display   ____________________________________________   INITIAL IMPRESSION / ASSESSMENT AND PLAN / ED COURSE  Pertinent labs & imaging results that were available during my care of the patient were reviewed by me and considered in my medical decision making (see chart for details).  Review of the [place] CSRS was performed in accordance of the [place] prior to dispensing any controlled drugs.         Assessment and Plan:  Bell's palsy 58 year old female presents to the emergency department with dull headache on the left, delayed eye closure on the left, drooping at the left side of her mouth and some tingling  around the left side of her mouth that started yesterday.  On physical exam, patient had asymmetrical movement of the forehead with decreased movement on the left.  Patient also had drooping on the left side of her mouth and delayed eye closure with blinking on the left..  Patient had symmetrical strength of the upper and lower extremities with normal sensation. Neuro exam was otherwise reassuring. There were no signs of herpes zoster on exam.  Differential diagnosis included Bell's palsy, brainstem lesion, CVA, herpes zoster..  Basic labs conducted in the emergency department  were reassuring.  CT head revealed no acute abnormality.  History and physical exam findings were suggestive of Bell's palsy.  Patient was discharged with prednisone and Valtrex.  Strict return precautions were given to return to the emergency department for new or worsening symptoms.  All patient questions were answered.   ____________________________________________  FINAL CLINICAL IMPRESSION(S) / ED DIAGNOSES  Final diagnoses:  Bell's palsy      NEW MEDICATIONS STARTED DURING THIS VISIT:  ED Discharge Orders         Ordered    predniSONE (DELTASONE) 20 MG tablet  3 times daily     01/07/19 1219    valACYclovir (VALTREX) 1000 MG tablet  3 times daily     01/07/19 1220              This chart was dictated using voice recognition software/Dragon. Despite best efforts to proofread, errors can occur which can change the meaning. Any change was purely unintentional.    Lannie Fields, PA-C 01/07/19 1227    Delman Kitten, MD 01/11/19 [id number]

## 2019-01-08 NOTE — Progress Notes (Signed)
Office: [phone]  /  Fax: [phone] TeleHealth Visit:  Katherine Smith has verbally consented to this TeleHealth visit today. The patient is located at work, the provider is located at the [employer] [facility] office. The participants in this visit include the listed provider and patient and any and all parties involved. The visit was conducted today via FaceTime.  HPI:   Chief Complaint: OBESITY Katherine Smith is here to discuss her progress with her obesity treatment plan. She is on the Category 3 plan and is following her eating plan approximately 50 % of the time. She states she is exercising 0 minutes 0 times per week. Katherine Smith feels she has done well maintaining her weight during the COVID-19 isolation. Katherine Smith is still struggling with emotional eating and comfort eating, but she is mindful and is trying to make healthier choices. We were unable to weigh the patient today for this TeleHealth visit. She feels as if she has maintained weight since her last visit. She has lost 4 lbs since starting treatment with [place].  Bell's Palsy Sharnee was diagnosed with Bell's Palsy in the ED (emergency department) this morning after waking up with left facial droop. She is now on Voltaren and Prednisone.  ASSESSMENT AND PLAN:  Bell's palsy  Class 3 severe obesity with serious comorbidity and body mass index (BMI) of 40.0 to 44.9 in adult, unspecified obesity type ([place])  PLAN:  Bell's Palsy Katherine Smith is worried about increased emotional eating temptations while on Prednisone and patient agreed to be mindful of this.  I spent > than 50% of the 15 minute visit on counseling as documented in the note.  Obesity Katherine Smith is currently in the action stage of change. As such, her goal is to continue with weight loss efforts She has agreed to follow the Category 3 plan Katherine Smith has been instructed to work up to a goal of 150 minutes of combined cardio and strengthening exercise per week for weight loss and  overall health benefits. We discussed the following Behavioral Modification Strategies today: increasing vegetables, emotional eating strategies and ways to avoid boredom eating  Katherine Smith has agreed to follow up with our clinic in 2 weeks. She was informed of the importance of frequent follow up visits to maximize her success with intensive lifestyle modifications for her multiple health conditions.  ALLERGIES: Allergies  Allergen Reactions  . Atorvastatin     Other reaction(s): Muscle Pain  . Dexfenfluramine     Other reaction(s): Unknown    MEDICATIONS: Current Outpatient Medications on File Prior to Visit  Medication Sig Dispense Refill  . Aspirin-Acetaminophen-Caffeine (EXCEDRIN MIGRAINE PO) Take 1 tablet by mouth daily as needed.    . Calcipotriene-Betameth Diprop (ENSTILAR) 0.005-0.064 % FOAM Apply topically.    . Cholecalciferol (VITAMIN D) 125 MCG (5000 UT) CAPS Take 1 capsule by mouth daily.    . clobetasol (TEMOVATE) 0.05 % external solution Apply 1 application topically 2 (two) times daily.    Marland Kitchen ezetimibe (ZETIA) 10 MG tablet TAKE 1 TABLET BY MOUTH ONCE DAILY.    . famciclovir (FAMVIR) 500 MG tablet Take 1 tablet (500 mg total) 3 (three) times daily by mouth. 21 tablet 0  . Insulin Pen Needle (BD PEN NEEDLE NANO 2ND GEN) 32G X 4 MM MISC 1 Package by Does not apply route 2 (two) times daily. 100 each 0  . levothyroxine (SYNTHROID) 150 MCG tablet Take 150 mcg by mouth daily before breakfast.    . Liraglutide -Weight Management (SAXENDA) 18 MG/3ML SOPN  Inject 3 mg into the skin daily. 5 pen 0  . lisinopril-hydrochlorothiazide (PRINZIDE,ZESTORETIC) 10-12.5 MG tablet Take 1 tablet by mouth daily.    . Multiple Vitamins-Minerals (HAIR SKIN & NAILS ADVANCED PO) Take 1 tablet by mouth daily.    . naproxen sodium (ANAPROX) 220 MG tablet Take 220 mg by mouth daily as needed.    . pantoprazole (PROTONIX) 40 MG tablet TAKE ONE TABLET BY MOUTH ONCE DAILY    . traZODone (DESYREL) 50 MG  tablet Take 50 mg by mouth at bedtime as needed for sleep.    . Vitamin D, Ergocalciferol, (DRISDOL) 1.25 MG (50000 UT) CAPS capsule Take 1 capsule (50,000 Units total) by mouth every 7 (seven) days. 4 capsule 0   No current facility-administered medications on file prior to visit.     PAST MEDICAL HISTORY: Past Medical History:  Diagnosis Date  . Anxiety   . Back pain   . Cancer (HCC)    BASAL CELL SKIN CANCER  . Depression   . Fatty liver   . GERD (gastroesophageal reflux disease)   . Hashimoto's thyroiditis   . HTN (hypertension)   . Hyperlipidemia   . Hypothyroidism   . IBS (irritable bowel syndrome)   . Insomnia   . Joint pain   . Lactose intolerance   . Lichen sclerosus   . Obesity   . Psoriasis   . Sleep apnea     PAST SURGICAL HISTORY: Past Surgical History:  Procedure Laterality Date  . ANTERIOR DISC ARTHROPLASTY     2004  . COLONOSCOPY  12/18/2011  . COLONOSCOPY WITH PROPOFOL N/A 04/04/2017   Procedure: COLONOSCOPY WITH PROPOFOL;  Surgeon: Manya Silvas, MD;  Location: [facility] ENDOSCOPY;  Service: Endoscopy;  Laterality: N/A;    SOCIAL HISTORY: Social History   Tobacco Use  . Smoking status: Never Smoker  . Smokeless tobacco: Never Used  Substance Use Topics  . Alcohol use: No  . Drug use: No    FAMILY HISTORY: Family History  Problem Relation Age of Onset  . Hypertension Mother   . Hyperlipidemia Mother   . Hypertension Father   . Breast cancer Neg Hx     ROS: Review of Systems  Constitutional: Negative for weight loss.  Neurological:       Positive for facial droop    PHYSICAL EXAM: Pt in no acute distress  RECENT LABS AND TESTS: BMET    Component Value Date/Time   NA 136 01/07/2019 0930   NA 140 10/02/2018 0831   K 4.1 01/07/2019 0930   CL 104 01/07/2019 0930   CO2 22 01/07/2019 0930   GLUCOSE 110 (H) 01/07/2019 0930   BUN 24 (H) 01/07/2019 0930   BUN 23 10/02/2018 0831   CREATININE 0.86 01/07/2019 0930   CALCIUM 9.5  01/07/2019 0930   GFRNONAA >60 01/07/2019 0930   GFRAA >60 01/07/2019 0930   Lab Results  Component Value Date   HGBA1C 5.1 11/23/2016   HGBA1C 5.3 08/10/2016   Lab Results  Component Value Date   INSULIN 46.2 (H) 10/02/2018   INSULIN 27.7 (H) 11/23/2016   INSULIN 38.9 (H) 08/10/2016   CBC    Component Value Date/Time   WBC 8.8 01/07/2019 0930   RBC 4.54 01/07/2019 0930   HGB 12.8 01/07/2019 0930   HGB 13.7 08/10/2016 1556   HCT 38.9 01/07/2019 0930   HCT 41.3 08/10/2016 1556   PLT 334 01/07/2019 0930   MCV 85.7 01/07/2019 0930   MCV 84 08/10/2016 1556  MCH 28.2 01/07/2019 0930   MCHC 32.9 01/07/2019 0930   RDW 13.9 01/07/2019 0930   RDW 14.0 08/10/2016 1556   LYMPHSABS 2.1 08/10/2016 1556   EOSABS 0.2 08/10/2016 1556   BASOSABS 0.1 08/10/2016 1556   Iron/TIBC/Ferritin/ %Sat No results found for: IRON, TIBC, FERRITIN, IRONPCTSAT Lipid Panel     Component Value Date/Time   CHOL 187 11/23/2016 0832   TRIG 101 11/23/2016 0832   HDL 66 11/23/2016 0832   LDLCALC 101 (H) 11/23/2016 0832   Hepatic Function Panel     Component Value Date/Time   PROT 7.9 01/07/2019 0930   PROT 7.5 10/02/2018 0831   ALBUMIN 4.3 01/07/2019 0930   ALBUMIN 4.7 10/02/2018 0831   AST 31 01/07/2019 0930   ALT 33 01/07/2019 0930   ALKPHOS 81 01/07/2019 0930   BILITOT 0.6 01/07/2019 0930   BILITOT 0.4 10/02/2018 0831      Component Value Date/Time   TSH 0.011 (L) 11/23/2016 0832   TSH 1.170 08/10/2016 1556     Ref. Range 10/02/2018 08:31  Vitamin D, 25-Hydroxy Latest Ref Range: 30.0 - 100.0 ng/mL 27.0 (L)    I, Doreene Nest, am acting as transcriptionist for Dennard Nip, MD I have reviewed the above documentation for accuracy and completeness, and I agree with the above. -Dennard Nip, MD

## 2019-01-15 DIAGNOSIS — D2262 Melanocytic nevi of left upper limb, including shoulder: Secondary | ICD-10-CM | POA: Diagnosis not present

## 2019-01-15 DIAGNOSIS — D225 Melanocytic nevi of trunk: Secondary | ICD-10-CM | POA: Diagnosis not present

## 2019-01-15 DIAGNOSIS — D2272 Melanocytic nevi of left lower limb, including hip: Secondary | ICD-10-CM | POA: Diagnosis not present

## 2019-01-15 DIAGNOSIS — G510 Bell's palsy: Secondary | ICD-10-CM | POA: Diagnosis not present

## 2019-01-15 DIAGNOSIS — Z08 Encounter for follow-up examination after completed treatment for malignant neoplasm: Secondary | ICD-10-CM | POA: Diagnosis not present

## 2019-01-15 DIAGNOSIS — D2271 Melanocytic nevi of right lower limb, including hip: Secondary | ICD-10-CM | POA: Diagnosis not present

## 2019-01-15 DIAGNOSIS — L900 Lichen sclerosus et atrophicus: Secondary | ICD-10-CM | POA: Diagnosis not present

## 2019-01-15 DIAGNOSIS — D2261 Melanocytic nevi of right upper limb, including shoulder: Secondary | ICD-10-CM | POA: Diagnosis not present

## 2019-01-15 DIAGNOSIS — Z85828 Personal history of other malignant neoplasm of skin: Secondary | ICD-10-CM | POA: Diagnosis not present

## 2019-01-22 ENCOUNTER — Encounter (INDEPENDENT_AMBULATORY_CARE_PROVIDER_SITE_OTHER): Payer: Self-pay | Admitting: Family Medicine

## 2019-01-22 ENCOUNTER — Ambulatory Visit (INDEPENDENT_AMBULATORY_CARE_PROVIDER_SITE_OTHER): Payer: 59 | Admitting: Family Medicine

## 2019-01-22 ENCOUNTER — Other Ambulatory Visit: Payer: Self-pay

## 2019-01-22 DIAGNOSIS — Z6841 Body Mass Index (BMI) 40.0 and over, adult: Secondary | ICD-10-CM

## 2019-01-22 DIAGNOSIS — E559 Vitamin D deficiency, unspecified: Secondary | ICD-10-CM | POA: Diagnosis not present

## 2019-01-22 MED ORDER — VITAMIN D (ERGOCALCIFEROL) 1.25 MG (50000 UNIT) PO CAPS: 50000.0000 [IU] | ORAL_CAPSULE | ORAL | 0 refills | Status: DC

## 2019-01-22 MED ORDER — INSULIN PEN NEEDLE 32G X 4 MM MISC: 1.0000 | Freq: Two times a day (BID) | 0 refills | Status: DC

## 2019-01-22 MED ORDER — LIRAGLUTIDE -WEIGHT MANAGEMENT 18 MG/3ML ~~LOC~~ SOPN: 3.0000 mg | PEN_INJECTOR | Freq: Every day | SUBCUTANEOUS | 0 refills | Status: DC

## 2019-01-27 NOTE — Progress Notes (Signed)
Office: [phone]  /  Fax: [phone] TeleHealth Visit:  Katherine Smith has verbally consented to this TeleHealth visit today. The patient is located at home, the provider is located at the [employer] [facility] office. The participants in this visit include the listed provider and patient. The visit was conducted today via Face Time.  HPI:   Chief Complaint: OBESITY Katherine Smith is here to discuss her progress with her obesity treatment plan. She is on the Category 3 plan and is following her eating plan approximately 30 % of the time. She states she is exercising 0 minutes 0 times per week. Katherine Smith is recovering from Bell's palsy and was taking prednisone, but feels that she has done well maintaining her weight. Katherine Smith tried [place] to 1.2 mg, due to nausea, but she is ready to increase to 1.8 mg again. She is ready to get back to her weight loss efforts.  We were unable to weigh the patient today for this TeleHealth visit. She feels as if she has gained weight since her last visit. She has lost 0 lbs since starting treatment with [place].  Vitamin D Deficiency Katherine Smith has a diagnosis of vitamin D deficiency. She is currently stable on vit D. Katherine Smith denies nausea, vomiting, or muscle weakness.  ASSESSMENT AND PLAN:  Vitamin D deficiency - Plan: Vitamin D, Ergocalciferol, (DRISDOL) 1.25 MG (50000 UT) CAPS capsule  Class 3 severe obesity with serious comorbidity and body mass index (BMI) of 40.0 to 44.9 in adult, unspecified obesity type ([place]) - Plan: Liraglutide -Weight Management (SAXENDA) 18 MG/3ML SOPN, Insulin Pen Needle (BD PEN NEEDLE NANO 2ND GEN) 32G X 4 MM MISC  PLAN:  Vitamin D Deficiency Katherine Smith was informed that low vitamin D levels contribute to fatigue and are associated with obesity, breast, and colon cancer. Katherine Smith agrees to continue to take prescription Vit D @50 ,000 IU every week #4 with no refills and will follow up for routine testing of vitamin D, at least 2-3 times per  year. She was informed of the risk of over-replacement of vitamin D and agrees to not increase her dose unless she discusses this with [place] first. [name] agrees to follow up in 2 weeks as directed.  I spent > than 50% of the 25 minute visit on counseling as documented in the note.  Obesity Katherine Smith is currently in the action stage of change. As such, her goal is to continue with weight loss efforts. She has agreed to follow the Category 3 plan. Katherine Smith has been instructed to work on exercise tapes to start for 10 to 15 minutes 3 times per week. We discussed the following Behavioral Modification Strategies today: work on meal planning and easy cooking plans, keeping healthy foods in the home, and ways to avoid boredom eating. Katherine Smith agrees to continue Saxenda 3.0 mg Katherine Smith qd #5 pens and nano needles #100 with no refills.  Katherine Smith has agreed to follow up with our clinic in 2 weeks. She was informed of the importance of frequent follow up visits to maximize her success with intensive lifestyle modifications for her multiple health conditions.  ALLERGIES: Allergies  Allergen Reactions  . Atorvastatin     Other reaction(s): Muscle Pain  . Dexfenfluramine     Other reaction(s): Unknown    MEDICATIONS: Current Outpatient Medications on File Prior to Visit  Medication Sig Dispense Refill  . Aspirin-Acetaminophen-Caffeine (EXCEDRIN MIGRAINE PO) Take 1 tablet by mouth daily as needed.    . Calcipotriene-Betameth Diprop (ENSTILAR) 0.005-0.064 % FOAM Apply  topically.    . Cholecalciferol (VITAMIN D) 125 MCG (5000 UT) CAPS Take 1 capsule by mouth daily.    . clobetasol (TEMOVATE) 0.05 % external solution Apply 1 application topically 2 (two) times daily.    Marland Kitchen ezetimibe (ZETIA) 10 MG tablet TAKE 1 TABLET BY MOUTH ONCE DAILY.    . famciclovir (FAMVIR) 500 MG tablet Take 1 tablet (500 mg total) 3 (three) times daily by mouth. 21 tablet 0  . levothyroxine (SYNTHROID) 150 MCG tablet Take 150 mcg by mouth daily  before breakfast.    . lisinopril-hydrochlorothiazide (PRINZIDE,ZESTORETIC) 10-12.5 MG tablet Take 1 tablet by mouth daily.    . Multiple Vitamins-Minerals (HAIR SKIN & NAILS ADVANCED PO) Take 1 tablet by mouth daily.    . naproxen sodium (ANAPROX) 220 MG tablet Take 220 mg by mouth daily as needed.    . pantoprazole (PROTONIX) 40 MG tablet TAKE ONE TABLET BY MOUTH ONCE DAILY    . traZODone (DESYREL) 50 MG tablet Take 50 mg by mouth at bedtime as needed for sleep.     No current facility-administered medications on file prior to visit.     PAST MEDICAL HISTORY: Past Medical History:  Diagnosis Date  . Anxiety   . Back pain   . Cancer (HCC)    BASAL CELL SKIN CANCER  . Depression   . Fatty liver   . GERD (gastroesophageal reflux disease)   . Hashimoto's thyroiditis   . HTN (hypertension)   . Hyperlipidemia   . Hypothyroidism   . IBS (irritable bowel syndrome)   . Insomnia   . Joint pain   . Lactose intolerance   . Lichen sclerosus   . Obesity   . Psoriasis   . Sleep apnea     PAST SURGICAL HISTORY: Past Surgical History:  Procedure Laterality Date  . ANTERIOR DISC ARTHROPLASTY     2004  . COLONOSCOPY  12/18/2011  . COLONOSCOPY WITH PROPOFOL N/A 04/04/2017   Procedure: COLONOSCOPY WITH PROPOFOL;  Surgeon: Manya Silvas, MD;  Location: [facility] ENDOSCOPY;  Service: Endoscopy;  Laterality: N/A;    SOCIAL HISTORY: Social History   Tobacco Use  . Smoking status: Never Smoker  . Smokeless tobacco: Never Used  Substance Use Topics  . Alcohol use: No  . Drug use: No    FAMILY HISTORY: Family History  Problem Relation Age of Onset  . Hypertension Mother   . Hyperlipidemia Mother   . Hypertension Father   . Breast cancer Neg Hx     ROS: Review of Systems  Gastrointestinal: Negative for nausea and vomiting.  Musculoskeletal:       Negative for muscle weakness.    PHYSICAL EXAM: Pt in no acute distress  RECENT LABS AND TESTS: BMET    Component Value  Date/Time   NA 136 01/07/2019 0930   NA 140 10/02/2018 0831   K 4.1 01/07/2019 0930   CL 104 01/07/2019 0930   CO2 22 01/07/2019 0930   GLUCOSE 110 (H) 01/07/2019 0930   BUN 24 (H) 01/07/2019 0930   BUN 23 10/02/2018 0831   CREATININE 0.86 01/07/2019 0930   CALCIUM 9.5 01/07/2019 0930   GFRNONAA >60 01/07/2019 0930   GFRAA >60 01/07/2019 0930   Lab Results  Component Value Date   HGBA1C 5.1 11/23/2016   HGBA1C 5.3 08/10/2016   Lab Results  Component Value Date   INSULIN 46.2 (H) 10/02/2018   INSULIN 27.7 (H) 11/23/2016   INSULIN 38.9 (H) 08/10/2016   CBC  Component Value Date/Time   WBC 8.8 01/07/2019 0930   RBC 4.54 01/07/2019 0930   HGB 12.8 01/07/2019 0930   HGB 13.7 08/10/2016 1556   HCT 38.9 01/07/2019 0930   HCT 41.3 08/10/2016 1556   PLT 334 01/07/2019 0930   MCV 85.7 01/07/2019 0930   MCV 84 08/10/2016 1556   MCH 28.2 01/07/2019 0930   MCHC 32.9 01/07/2019 0930   RDW 13.9 01/07/2019 0930   RDW 14.0 08/10/2016 1556   LYMPHSABS 2.1 08/10/2016 1556   EOSABS 0.2 08/10/2016 1556   BASOSABS 0.1 08/10/2016 1556   Iron/TIBC/Ferritin/ %Sat No results found for: IRON, TIBC, FERRITIN, IRONPCTSAT Lipid Panel     Component Value Date/Time   CHOL 187 11/23/2016 0832   TRIG 101 11/23/2016 0832   HDL 66 11/23/2016 0832   LDLCALC 101 (H) 11/23/2016 0832   Hepatic Function Panel     Component Value Date/Time   PROT 7.9 01/07/2019 0930   PROT 7.5 10/02/2018 0831   ALBUMIN 4.3 01/07/2019 0930   ALBUMIN 4.7 10/02/2018 0831   AST 31 01/07/2019 0930   ALT 33 01/07/2019 0930   ALKPHOS 81 01/07/2019 0930   BILITOT 0.6 01/07/2019 0930   BILITOT 0.4 10/02/2018 0831      Component Value Date/Time   TSH 0.011 (L) 11/23/2016 0832   TSH 1.170 08/10/2016 1556   Results for Katherine, Smith (MRN [id number]) as of 01/27/2019 13:09  Ref. Range 10/02/2018 08:31  Vitamin D, 25-Hydroxy Latest Ref Range: 30.0 - 100.0 ng/mL 27.0 (L)    I, Marcille Blanco, CMA, am acting as  transcriptionist for Starlyn Skeans, MD I have reviewed the above documentation for accuracy and completeness, and I agree with the above. -Dennard Nip, MD

## 2019-02-10 ENCOUNTER — Other Ambulatory Visit: Payer: Self-pay

## 2019-02-10 ENCOUNTER — Ambulatory Visit (INDEPENDENT_AMBULATORY_CARE_PROVIDER_SITE_OTHER): Payer: 59 | Admitting: Family Medicine

## 2019-02-10 VITALS — BP 105/75 | HR 100 | Temp 98.2°F | Ht 63.0 in | Wt 233.0 lb

## 2019-02-10 DIAGNOSIS — Z9189 Other specified personal risk factors, not elsewhere classified: Secondary | ICD-10-CM | POA: Diagnosis not present

## 2019-02-10 DIAGNOSIS — E559 Vitamin D deficiency, unspecified: Secondary | ICD-10-CM | POA: Diagnosis not present

## 2019-02-10 DIAGNOSIS — E8881 Metabolic syndrome: Secondary | ICD-10-CM | POA: Diagnosis not present

## 2019-02-10 DIAGNOSIS — Z6841 Body Mass Index (BMI) 40.0 and over, adult: Secondary | ICD-10-CM | POA: Diagnosis not present

## 2019-02-10 DIAGNOSIS — E039 Hypothyroidism, unspecified: Secondary | ICD-10-CM | POA: Diagnosis not present

## 2019-02-10 MED ORDER — VITAMIN D (ERGOCALCIFEROL) 1.25 MG (50000 UNIT) PO CAPS: 50000.0000 [IU] | ORAL_CAPSULE | ORAL | 0 refills | Status: DC

## 2019-02-10 NOTE — Progress Notes (Signed)
Office: [phone]  /  Fax: [phone]   HPI:   Chief Complaint: OBESITY Katherine Smith is here to discuss her progress with her obesity treatment plan. She is on the Category 3 plan and is following her eating plan approximately 80 % of the time. She states she is exercising 0 minutes 0 times per week. Katherine Smith has done very well with weight loss over the last three months in COVID-19 isolation. She is following her plan well overall. Katherine Smith is on Saxenda at 1.5 mg. She had GI upset at 1.8 mg, but she is doing well at a slightly lower dose. Her weight is 233 lb (105.7 kg) today and has had a weight loss of 3 pounds since her last in-office visit. She has lost 7 lbs since starting treatment with [place].  Vitamin D deficiency Katherine Smith has a diagnosis of vitamin D deficiency. Katherine Smith is stable on Vitamin D. She is due for labs. Katherine Smith had labs done earlier today, but she didn't have vitamin D labs. Katherine Smith denies nausea, vomiting or muscle weakness.  Insulin Resistance Katherine Smith has a diagnosis of insulin resistance based on her elevated fasting insulin level >5. Although Charmain's blood glucose readings are still under good control, insulin resistance puts her at greater risk of metabolic syndrome and diabetes. She is stable on Liraglutide and diet. Katherine Smith has decreased polyphagia. She is doing well with weight loss. Katherine Smith continues to work on diet and exercise to decrease risk of diabetes. She denies nausea, vomiting or hypoglycemia.  At risk for diabetes Katherine Smith is at higher than average risk for developing diabetes due to her obesity and insulin resistance. She currently denies polyuria or polydipsia.  ASSESSMENT AND PLAN:  Vitamin D deficiency - Plan: VITAMIN D 25 Hydroxy (Vit-D Deficiency, Fractures), Vitamin D, Ergocalciferol, (DRISDOL) 1.25 MG (50000 UT) CAPS capsule  Insulin resistance  At risk for diabetes mellitus  Class 3 severe obesity with serious comorbidity and body mass index (BMI) of  40.0 to 44.9 in adult, unspecified obesity type ([place])  PLAN:  Vitamin D Deficiency Katherine Smith was informed that low vitamin D levels contributes to fatigue and are associated with obesity, breast, and colon cancer. She agrees to continue to take prescription Vit D @50 ,000 IU every week #4 with no refills and will follow up for routine testing of vitamin D, at least 2-3 times per year. She was informed of the risk of over-replacement of vitamin D and agrees to not increase her dose unless she discusses this with [place] first. Elli agrees to follow up as directed.  Insulin Resistance Katherine Smith will continue to work on weight loss, exercise, and decreasing simple carbohydrates in her diet to help decrease the risk of diabetes. We dicussed metformin including benefits and risks. She was informed that eating too many simple carbohydrates or too many calories at one sitting increases the likelihood of GI side effects. Katherine Smith will continue liraglutide and diet and follow up with [place] as directed to monitor her progress. We will check labs next month.  Diabetes risk counseling Katherine Smith was given extended (15 minutes) diabetes prevention counseling today. She is 58 y.o. female and has risk factors for diabetes including obesity and insulin resistance. We discussed intensive lifestyle modifications today with an emphasis on weight loss as well as increasing exercise and decreasing simple carbohydrates in her diet.  Obesity Katherine Smith is currently in the action stage of change. As such, her goal is to continue with weight loss efforts She has agreed to follow the Category 3 plan  Katherine Smith will start walking and doing cardio exercise for weight loss and overall health benefits. We discussed the following Behavioral Modification Strategies today: increasing lean protein intake and decreasing simple carbohydrates   Katherine Smith has agreed to follow up with our clinic in 2 weeks. She was informed of the importance of frequent follow up  visits to maximize her success with intensive lifestyle modifications for her multiple health conditions.  ALLERGIES: Allergies  Allergen Reactions  . Atorvastatin     Other reaction(s): Muscle Pain  . Dexfenfluramine     Other reaction(s): Unknown    MEDICATIONS: Current Outpatient Medications on File Prior to Visit  Medication Sig Dispense Refill  . Aspirin-Acetaminophen-Caffeine (EXCEDRIN MIGRAINE PO) Take 1 tablet by mouth daily as needed.    . Calcipotriene-Betameth Diprop (ENSTILAR) 0.005-0.064 % FOAM Apply topically.    . Cholecalciferol (VITAMIN D) 125 MCG (5000 UT) CAPS Take 1 capsule by mouth daily.    . clobetasol (TEMOVATE) 0.05 % external solution Apply 1 application topically 2 (two) times daily.    Marland Kitchen ezetimibe (ZETIA) 10 MG tablet TAKE 1 TABLET BY MOUTH ONCE DAILY.    Marland Kitchen Insulin Pen Needle (BD PEN NEEDLE NANO 2ND GEN) 32G X 4 MM MISC 1 Package by Does not apply route 2 (two) times daily. 100 each 0  . levothyroxine (SYNTHROID) 150 MCG tablet Take 150 mcg by mouth daily before breakfast.    . Liraglutide -Weight Management (SAXENDA) 18 MG/3ML SOPN Inject 3 mg into the skin daily. 5 pen 0  . lisinopril-hydrochlorothiazide (PRINZIDE,ZESTORETIC) 10-12.5 MG tablet Take 1 tablet by mouth daily.    . Multiple Vitamins-Minerals (HAIR SKIN & NAILS ADVANCED PO) Take 1 tablet by mouth daily.    . naproxen sodium (ANAPROX) 220 MG tablet Take 220 mg by mouth daily as needed.    . pantoprazole (PROTONIX) 40 MG tablet TAKE ONE TABLET BY MOUTH ONCE DAILY    . traZODone (DESYREL) 50 MG tablet Take 50 mg by mouth at bedtime as needed for sleep.    . famciclovir (FAMVIR) 500 MG tablet Take 1 tablet (500 mg total) 3 (three) times daily by mouth. (Patient not taking: Reported on 02/10/2019) 21 tablet 0   No current facility-administered medications on file prior to visit.     PAST MEDICAL HISTORY: Past Medical History:  Diagnosis Date  . Anxiety   . Back pain   . Cancer (HCC)    BASAL  CELL SKIN CANCER  . Depression   . Fatty liver   . GERD (gastroesophageal reflux disease)   . Hashimoto's thyroiditis   . HTN (hypertension)   . Hyperlipidemia   . Hypothyroidism   . IBS (irritable bowel syndrome)   . Insomnia   . Joint pain   . Lactose intolerance   . Lichen sclerosus   . Obesity   . Psoriasis   . Sleep apnea     PAST SURGICAL HISTORY: Past Surgical History:  Procedure Laterality Date  . ANTERIOR DISC ARTHROPLASTY     2004  . COLONOSCOPY  12/18/2011  . COLONOSCOPY WITH PROPOFOL N/A 04/04/2017   Procedure: COLONOSCOPY WITH PROPOFOL;  Surgeon: Manya Silvas, MD;  Location: [facility] ENDOSCOPY;  Service: Endoscopy;  Laterality: N/A;    SOCIAL HISTORY: Social History   Tobacco Use  . Smoking status: Never Smoker  . Smokeless tobacco: Never Used  Substance Use Topics  . Alcohol use: No  . Drug use: No    FAMILY HISTORY: Family History  Problem Relation Age of Onset  .  Hypertension Mother   . Hyperlipidemia Mother   . Hypertension Father   . Breast cancer Neg Hx     ROS: Review of Systems  Constitutional: Positive for weight loss.  Gastrointestinal: Negative for nausea and vomiting.  Genitourinary: Negative for frequency.  Musculoskeletal:       Negative for muscle weakness  Endo/Heme/Allergies: Negative for polydipsia.       Negative for hypoglycemia Positive for polyphagia    PHYSICAL EXAM: Blood pressure 105/75, pulse 100, temperature 98.2 F (36.8 C), temperature source Oral, height 5\' 3"  (1.6 m), weight 233 lb (105.7 kg), last menstrual period 04/14/2016, SpO2 98 %. Body mass index is 41.27 kg/m. Physical Exam Vitals signs reviewed.  Constitutional:      Appearance: Normal appearance. She is well-developed. She is obese.  Cardiovascular:     Rate and Rhythm: Normal rate.  Pulmonary:     Effort: Pulmonary effort is normal.  Musculoskeletal: Normal range of motion.  Skin:    General: Skin is warm and dry.  Neurological:      Mental Status: She is alert and oriented to person, place, and time.  Psychiatric:        Mood and Affect: Mood normal.        Behavior: Behavior normal.     RECENT LABS AND TESTS: BMET    Component Value Date/Time   NA 136 01/07/2019 0930   NA 140 10/02/2018 0831   K 4.1 01/07/2019 0930   CL 104 01/07/2019 0930   CO2 22 01/07/2019 0930   GLUCOSE 110 (H) 01/07/2019 0930   BUN 24 (H) 01/07/2019 0930   BUN 23 10/02/2018 0831   CREATININE 0.86 01/07/2019 0930   CALCIUM 9.5 01/07/2019 0930   GFRNONAA >60 01/07/2019 0930   GFRAA >60 01/07/2019 0930   Lab Results  Component Value Date   HGBA1C 5.1 11/23/2016   HGBA1C 5.3 08/10/2016   Lab Results  Component Value Date   INSULIN 46.2 (H) 10/02/2018   INSULIN 27.7 (H) 11/23/2016   INSULIN 38.9 (H) 08/10/2016   CBC    Component Value Date/Time   WBC 8.8 01/07/2019 0930   RBC 4.54 01/07/2019 0930   HGB 12.8 01/07/2019 0930   HGB 13.7 08/10/2016 1556   HCT 38.9 01/07/2019 0930   HCT 41.3 08/10/2016 1556   PLT 334 01/07/2019 0930   MCV 85.7 01/07/2019 0930   MCV 84 08/10/2016 1556   MCH 28.2 01/07/2019 0930   MCHC 32.9 01/07/2019 0930   RDW 13.9 01/07/2019 0930   RDW 14.0 08/10/2016 1556   LYMPHSABS 2.1 08/10/2016 1556   EOSABS 0.2 08/10/2016 1556   BASOSABS 0.1 08/10/2016 1556   Iron/TIBC/Ferritin/ %Sat No results found for: IRON, TIBC, FERRITIN, IRONPCTSAT Lipid Panel     Component Value Date/Time   CHOL 187 11/23/2016 0832   TRIG 101 11/23/2016 0832   HDL 66 11/23/2016 0832   LDLCALC 101 (H) 11/23/2016 0832   Hepatic Function Panel     Component Value Date/Time   PROT 7.9 01/07/2019 0930   PROT 7.5 10/02/2018 0831   ALBUMIN 4.3 01/07/2019 0930   ALBUMIN 4.7 10/02/2018 0831   AST 31 01/07/2019 0930   ALT 33 01/07/2019 0930   ALKPHOS 81 01/07/2019 0930   BILITOT 0.6 01/07/2019 0930   BILITOT 0.4 10/02/2018 0831      Component Value Date/Time   TSH 0.011 (L) 11/23/2016 0832   TSH 1.170 08/10/2016  1556     Ref. Range 10/02/2018 08:31  Vitamin  D, 25-Hydroxy Latest Ref Range: 30.0 - 100.0 ng/mL 27.0 (L)    OBESITY BEHAVIORAL INTERVENTION VISIT  Today's visit was # 20   Starting weight: 240 lbs Starting date: 08/10/2016 Today's weight : 233 lbs  Today's date: 02/10/2019 Total lbs lost to date: 7    02/10/2019  Height 5\' 3"  (1.6 m)  Weight 233 lb (105.7 kg)  BMI (Calculated) 41.28  BLOOD PRESSURE - SYSTOLIC 073  BLOOD PRESSURE - DIASTOLIC 75   Body Fat % 71.0 %  Total Body Water (lbs) 81.8 lbs    ASK: We discussed the diagnosis of obesity with Tobin Chad today and Shiane agreed to give [place] permission to discuss obesity behavioral modification therapy today.  ASSESS: Vicenta has the diagnosis of obesity and her BMI today is 41.28 Adelyn is in the action stage of change   ADVISE: Dnasia was educated on the multiple health risks of obesity as well as the benefit of weight loss to improve her health. She was advised of the need for long term treatment and the importance of lifestyle modifications to improve her current health and to decrease her risk of future health problems.  AGREE: Multiple dietary modification options and treatment options were discussed and  Shaniya agreed to follow the recommendations documented in the above note.  ARRANGE: Tishawna was educated on the importance of frequent visits to treat obesity as outlined per CMS and USPSTF guidelines and agreed to schedule her next follow up appointment today.  I, Doreene Nest, am acting as transcriptionist for Dennard Nip, MD  I have reviewed the above documentation for accuracy and completeness, and I agree with the above. -Dennard Nip, MD

## 2019-02-25 ENCOUNTER — Encounter (INDEPENDENT_AMBULATORY_CARE_PROVIDER_SITE_OTHER): Payer: Self-pay | Admitting: Family Medicine

## 2019-02-25 ENCOUNTER — Ambulatory Visit (INDEPENDENT_AMBULATORY_CARE_PROVIDER_SITE_OTHER): Payer: 59 | Admitting: Family Medicine

## 2019-02-25 ENCOUNTER — Other Ambulatory Visit: Payer: Self-pay

## 2019-02-25 VITALS — BP 109/75 | HR 95 | Temp 97.8°F | Ht 63.0 in | Wt 230.0 lb

## 2019-02-25 DIAGNOSIS — E559 Vitamin D deficiency, unspecified: Secondary | ICD-10-CM | POA: Diagnosis not present

## 2019-02-25 DIAGNOSIS — Z6841 Body Mass Index (BMI) 40.0 and over, adult: Secondary | ICD-10-CM

## 2019-02-25 DIAGNOSIS — Z9189 Other specified personal risk factors, not elsewhere classified: Secondary | ICD-10-CM | POA: Diagnosis not present

## 2019-02-25 MED ORDER — VITAMIN D (ERGOCALCIFEROL) 1.25 MG (50000 UNIT) PO CAPS: 50000.0000 [IU] | ORAL_CAPSULE | ORAL | 0 refills | Status: DC

## 2019-02-25 NOTE — Progress Notes (Signed)
Office: [phone]  /  Fax: [phone]   HPI:   Chief Complaint: OBESITY Katherine Smith is here to discuss her progress with her obesity treatment plan. She is on the Category 3 plan and is following her eating plan approximately 85 % of the time. She states she is trying to get steps in for exercise. Katherine Smith continues to do well with weigh loss on her Category 3 plan. She is starting to deviate more and little calories are sneaking in more often. Her weight is 230 lb (104.3 kg) today and has had a weight loss of 3 pounds over a period of 2 weeks since her last visit. She has lost 10 lbs since starting treatment with [place].  Vitamin D deficiency Katherine Smith has a diagnosis of vitamin D deficiency. She is stable on vit D and denies nausea, vomiting or muscle weakness.  At risk for osteopenia and osteoporosis Katherine Smith is at higher risk of osteopenia and osteoporosis due to vitamin D deficiency.   ASSESSMENT AND PLAN:  Vitamin D deficiency - Plan: Vitamin D, Ergocalciferol, (DRISDOL) 1.25 MG (50000 UT) CAPS capsule  At risk for osteoporosis  Class 3 severe obesity with serious comorbidity and body mass index (BMI) of 40.0 to 44.9 in adult, unspecified obesity type ([place])  PLAN:  Vitamin D Deficiency Katherine Smith was informed that low vitamin D levels contributes to fatigue and are associated with obesity, breast, and colon cancer. She agrees to continue to take prescription Vit D @50 ,000 IU every week and will follow up for routine testing of vitamin D, at least 2-3 times per year. She was informed of the risk of over-replacement of vitamin D and agrees to not increase her dose unless she discusses this with [place] first.  At risk for osteopenia and osteoporosis Katherine Smith was given extended  (15 minutes) osteoporosis prevention counseling today. Katherine Smith is at risk for osteopenia and osteoporosis due to her vitamin D deficiency. She was encouraged to take her vitamin D and follow her higher calcium diet and  increase strengthening exercise to help strengthen her bones and decrease her risk of osteopenia and osteoporosis.  Obesity Katherine Smith is currently in the action stage of change. As such, her goal is to continue with weight loss efforts She has agreed to follow the Category 3 plan or keep a food journal with 1600 to 2000 calories and 100+ grams of protein daily Katherine Smith has been instructed to work up to a goal of 150 minutes of combined cardio and strengthening exercise per week for weight loss and overall health benefits. We discussed the following Behavioral Modification Strategies today: keep a strict food journal, increasing lean protein intake, decreasing simple carbohydrates and work on meal planning and easy cooking plans  Katherine Smith has agreed to follow up with our clinic in 2 to 3 weeks. She was informed of the importance of frequent follow up visits to maximize her success with intensive lifestyle modifications for her multiple health conditions.  ALLERGIES: Allergies  Allergen Reactions  . Atorvastatin     Other reaction(s): Muscle Pain  . Dexfenfluramine     Other reaction(s): Unknown    MEDICATIONS: Current Outpatient Medications on File Prior to Visit  Medication Sig Dispense Refill  . Aspirin-Acetaminophen-Caffeine (EXCEDRIN MIGRAINE PO) Take 1 tablet by mouth daily as needed.    . Calcipotriene-Betameth Diprop (ENSTILAR) 0.005-0.064 % FOAM Apply topically.    . Cholecalciferol (VITAMIN D) 125 MCG (5000 UT) CAPS Take 1 capsule by mouth daily.    . clobetasol (TEMOVATE) 0.05 %  external solution Apply 1 application topically 2 (two) times daily.    Marland Kitchen ezetimibe (ZETIA) 10 MG tablet TAKE 1 TABLET BY MOUTH ONCE DAILY.    . famciclovir (FAMVIR) 500 MG tablet Take 1 tablet (500 mg total) 3 (three) times daily by mouth. 21 tablet 0  . Insulin Pen Needle (BD PEN NEEDLE NANO 2ND GEN) 32G X 4 MM MISC 1 Package by Does not apply route 2 (two) times daily. 100 each 0  . levothyroxine  (SYNTHROID) 150 MCG tablet Take 150 mcg by mouth daily before breakfast.    . Liraglutide -Weight Management (SAXENDA) 18 MG/3ML SOPN Inject 3 mg into the skin daily. 5 pen 0  . lisinopril-hydrochlorothiazide (PRINZIDE,ZESTORETIC) 10-12.5 MG tablet Take 1 tablet by mouth daily.    . Multiple Vitamins-Minerals (HAIR SKIN & NAILS ADVANCED PO) Take 1 tablet by mouth daily.    . naproxen sodium (ANAPROX) 220 MG tablet Take 220 mg by mouth daily as needed.    . pantoprazole (PROTONIX) 40 MG tablet TAKE ONE TABLET BY MOUTH ONCE DAILY    . traZODone (DESYREL) 50 MG tablet Take 50 mg by mouth at bedtime as needed for sleep.     No current facility-administered medications on file prior to visit.     PAST MEDICAL HISTORY: Past Medical History:  Diagnosis Date  . Anxiety   . Back pain   . Cancer (HCC)    BASAL CELL SKIN CANCER  . Depression   . Fatty liver   . GERD (gastroesophageal reflux disease)   . Hashimoto's thyroiditis   . HTN (hypertension)   . Hyperlipidemia   . Hypothyroidism   . IBS (irritable bowel syndrome)   . Insomnia   . Joint pain   . Lactose intolerance   . Lichen sclerosus   . Obesity   . Psoriasis   . Sleep apnea     PAST SURGICAL HISTORY: Past Surgical History:  Procedure Laterality Date  . ANTERIOR DISC ARTHROPLASTY     2004  . COLONOSCOPY  12/18/2011  . COLONOSCOPY WITH PROPOFOL N/A 04/04/2017   Procedure: COLONOSCOPY WITH PROPOFOL;  Surgeon: Manya Silvas, MD;  Location: [facility] ENDOSCOPY;  Service: Endoscopy;  Laterality: N/A;    SOCIAL HISTORY: Social History   Tobacco Use  . Smoking status: Never Smoker  . Smokeless tobacco: Never Used  Substance Use Topics  . Alcohol use: No  . Drug use: No    FAMILY HISTORY: Family History  Problem Relation Age of Onset  . Hypertension Mother   . Hyperlipidemia Mother   . Hypertension Father   . Breast cancer Neg Hx     ROS: Review of Systems  Constitutional: Positive for weight loss.   Gastrointestinal: Negative for nausea and vomiting.  Musculoskeletal:       Negative for muscle weakness    PHYSICAL EXAM: Blood pressure 109/75, pulse 95, temperature 97.8 F (36.6 C), temperature source Oral, height 5\' 3"  (1.6 m), weight 230 lb (104.3 kg), last menstrual period 04/14/2016, SpO2 99 %. Body mass index is 40.74 kg/m. Physical Exam Vitals signs reviewed.  Constitutional:      Appearance: Normal appearance. She is well-developed. She is obese.  Cardiovascular:     Rate and Rhythm: Normal rate.  Pulmonary:     Effort: Pulmonary effort is normal.  Musculoskeletal: Normal range of motion.  Skin:    General: Skin is warm and dry.  Neurological:     Mental Status: She is alert and oriented to person, place,  and time.  Psychiatric:        Mood and Affect: Mood normal.        Behavior: Behavior normal.     RECENT LABS AND TESTS: BMET    Component Value Date/Time   NA 136 01/07/2019 0930   NA 140 10/02/2018 0831   K 4.1 01/07/2019 0930   CL 104 01/07/2019 0930   CO2 22 01/07/2019 0930   GLUCOSE 110 (H) 01/07/2019 0930   BUN 24 (H) 01/07/2019 0930   BUN 23 10/02/2018 0831   CREATININE 0.86 01/07/2019 0930   CALCIUM 9.5 01/07/2019 0930   GFRNONAA >60 01/07/2019 0930   GFRAA >60 01/07/2019 0930   Lab Results  Component Value Date   HGBA1C 5.1 11/23/2016   HGBA1C 5.3 08/10/2016   Lab Results  Component Value Date   INSULIN 46.2 (H) 10/02/2018   INSULIN 27.7 (H) 11/23/2016   INSULIN 38.9 (H) 08/10/2016   CBC    Component Value Date/Time   WBC 8.8 01/07/2019 0930   RBC 4.54 01/07/2019 0930   HGB 12.8 01/07/2019 0930   HGB 13.7 08/10/2016 1556   HCT 38.9 01/07/2019 0930   HCT 41.3 08/10/2016 1556   PLT 334 01/07/2019 0930   MCV 85.7 01/07/2019 0930   MCV 84 08/10/2016 1556   MCH 28.2 01/07/2019 0930   MCHC 32.9 01/07/2019 0930   RDW 13.9 01/07/2019 0930   RDW 14.0 08/10/2016 1556   LYMPHSABS 2.1 08/10/2016 1556   EOSABS 0.2 08/10/2016 1556    BASOSABS 0.1 08/10/2016 1556   Iron/TIBC/Ferritin/ %Sat No results found for: IRON, TIBC, FERRITIN, IRONPCTSAT Lipid Panel     Component Value Date/Time   CHOL 187 11/23/2016 0832   TRIG 101 11/23/2016 0832   HDL 66 11/23/2016 0832   LDLCALC 101 (H) 11/23/2016 0832   Hepatic Function Panel     Component Value Date/Time   PROT 7.9 01/07/2019 0930   PROT 7.5 10/02/2018 0831   ALBUMIN 4.3 01/07/2019 0930   ALBUMIN 4.7 10/02/2018 0831   AST 31 01/07/2019 0930   ALT 33 01/07/2019 0930   ALKPHOS 81 01/07/2019 0930   BILITOT 0.6 01/07/2019 0930   BILITOT 0.4 10/02/2018 0831      Component Value Date/Time   TSH 0.011 (L) 11/23/2016 0832   TSH 1.170 08/10/2016 1556      OBESITY BEHAVIORAL INTERVENTION VISIT  Today's visit was # 21   Starting weight: 240 lbs Starting date: 08/10/2016 Today's weight : 230 lbs Today's date: 02/25/2019 Total lbs lost to date: 10    02/25/2019  Height 5\' 3"  (1.6 m)  Weight 230 lb (104.3 kg)  BMI (Calculated) 40.75  BLOOD PRESSURE - SYSTOLIC 294  BLOOD PRESSURE - DIASTOLIC 75   Body Fat % 76.5 %  Total Body Water (lbs) 79.4 lbs    ASK: We discussed the diagnosis of obesity with Katherine Smith today and Katherine Smith agreed to give [place] permission to discuss obesity behavioral modification therapy today.  ASSESS: Shanyla has the diagnosis of obesity and her BMI today is 40.75 Katherine Smith is in the action stage of change   ADVISE: Katherine Smith was educated on the multiple health risks of obesity as well as the benefit of weight loss to improve her health. She was advised of the need for long term treatment and the importance of lifestyle modifications to improve her current health and to decrease her risk of future health problems.  AGREE: Multiple dietary modification options and treatment options were discussed and  Katherine Smith agreed to follow the recommendations documented in the above note.  ARRANGE: Katherine Smith was educated on the importance of frequent  visits to treat obesity as outlined per CMS and USPSTF guidelines and agreed to schedule her next follow up appointment today.  I, Doreene Nest, am acting as transcriptionist for Dennard Nip, MD  I have reviewed the above documentation for accuracy and completeness, and I agree with the above. -Dennard Nip, MD

## 2019-03-10 ENCOUNTER — Ambulatory Visit (INDEPENDENT_AMBULATORY_CARE_PROVIDER_SITE_OTHER): Payer: 59 | Admitting: Family Medicine

## 2019-03-11 ENCOUNTER — Other Ambulatory Visit: Payer: Self-pay

## 2019-03-11 ENCOUNTER — Ambulatory Visit (INDEPENDENT_AMBULATORY_CARE_PROVIDER_SITE_OTHER): Payer: 59 | Admitting: Family Medicine

## 2019-03-11 VITALS — BP 108/71 | HR 101 | Temp 98.4°F | Ht 63.0 in | Wt 228.0 lb

## 2019-03-11 DIAGNOSIS — E8881 Metabolic syndrome: Secondary | ICD-10-CM

## 2019-03-11 DIAGNOSIS — E559 Vitamin D deficiency, unspecified: Secondary | ICD-10-CM | POA: Diagnosis not present

## 2019-03-11 DIAGNOSIS — Z01419 Encounter for gynecological examination (general) (routine) without abnormal findings: Secondary | ICD-10-CM | POA: Diagnosis not present

## 2019-03-11 DIAGNOSIS — Z6841 Body Mass Index (BMI) 40.0 and over, adult: Secondary | ICD-10-CM | POA: Diagnosis not present

## 2019-03-11 DIAGNOSIS — Z1231 Encounter for screening mammogram for malignant neoplasm of breast: Secondary | ICD-10-CM | POA: Diagnosis not present

## 2019-03-11 NOTE — Progress Notes (Signed)
Office: [phone]  /  Fax: [phone]   HPI:   Chief Complaint: OBESITY Katherine Smith is here to discuss her progress with her obesity treatment plan. She is on the keep a food journal with 1600-2000 calories and 100+ grams of protein daily or follow the Category 3 plan and is following her eating plan approximately 85 % of the time. She states she is exercising 0 minutes 0 times per week. Katherine Smith has done journaling the past few weeks and she enjoys this. She has struggled to find enough fiber. Sometimes she is not hitting 1600 calorie mark.  Her weight is 228 lb (103.4 kg) today and has had a weight loss of 2 pounds over a period of 2 weeks since her last visit. She has lost 12 lbs since starting treatment with [place].  Vitamin D Deficiency Katherine Smith has a diagnosis of vitamin D deficiency. She is currently taking prescription Vit D. She notes fatigue and denies nausea, vomiting or muscle weakness.  Insulin Resistance Katherine Smith has a diagnosis of insulin resistance based on her elevated fasting insulin level >5. Last insulin level was of 46.2. Although Katherine Smith's blood glucose readings are still under good control, insulin resistance puts her at greater risk of metabolic syndrome and diabetes. She is not taking metformin currently and continues to work on diet and exercise to decrease risk of diabetes.  ASSESSMENT AND PLAN:  Vitamin D deficiency  Insulin resistance  Class 3 severe obesity with serious comorbidity and body mass index (BMI) of 40.0 to 44.9 in adult, unspecified obesity type ([place])  PLAN:  Vitamin D Deficiency Normalee was informed that low vitamin D levels contributes to fatigue and are associated with obesity, breast, and colon cancer. Katherine Smith agrees to continue taking prescription Vit D 50,000 IU every week, no refill needed. She will follow up for routine testing of vitamin D, at least 2-3 times per year. She was informed of the risk of over-replacement of vitamin D and agrees to not  increase her dose unless she discusses this with [place] first.Katherine Smith agrees to follow up with our clinic in 2 weeks.  Insulin Resistance Katherine Smith will continue to work on weight loss, exercise, and decreasing simple carbohydrates in her diet to help decrease the risk of diabetes. We dicussed metformin including benefits and risks. She was informed that eating too many simple carbohydrates or too many calories at one sitting increases the likelihood of GI side effects. Katherine Smith declined metformin for now and prescription was not written today. We will repeat labs in August. Katherine Smith agrees to follow up with our clinic in 2 weeks as directed to monitor her progress.  Obesity Katherine Smith is currently in the action stage of change. As such, her goal is to continue with weight loss efforts She has agreed to keep a food journal with 1600-2000 calories and 100+ grams of protein daily Katherine Smith has been instructed to work up to a goal of 150 minutes of combined cardio and strengthening exercise per week or start walking for 15 minutes 3 times per week for weight loss and overall health benefits. We discussed the following Behavioral Modification Strategies today: increasing lean protein intake, increasing vegetables and work on meal planning and easy cooking plans, keeping healthy foods in the home, and planning for success   Katherine Smith has agreed to follow up with our clinic in 2 weeks. She was informed of the importance of frequent follow up visits to maximize her success with intensive lifestyle modifications for her multiple health conditions.  ALLERGIES:  Allergies  Allergen Reactions  . Atorvastatin     Other reaction(s): Muscle Pain  . Dexfenfluramine     Other reaction(s): Unknown    MEDICATIONS: Current Outpatient Medications on File Prior to Visit  Medication Sig Dispense Refill  . Aspirin-Acetaminophen-Caffeine (EXCEDRIN MIGRAINE PO) Take 1 tablet by mouth daily as needed.    . Calcipotriene-Betameth Diprop  (ENSTILAR) 0.005-0.064 % FOAM Apply topically.    . Cholecalciferol (VITAMIN D) 125 MCG (5000 UT) CAPS Take 1 capsule by mouth daily.    . clobetasol (TEMOVATE) 0.05 % external solution Apply 1 application topically 2 (two) times daily.    Marland Kitchen ezetimibe (ZETIA) 10 MG tablet TAKE 1 TABLET BY MOUTH ONCE DAILY.    . famciclovir (FAMVIR) 500 MG tablet Take 1 tablet (500 mg total) 3 (three) times daily by mouth. 21 tablet 0  . Insulin Pen Needle (BD PEN NEEDLE NANO 2ND GEN) 32G X 4 MM MISC 1 Package by Does not apply route 2 (two) times daily. 100 each 0  . levothyroxine (SYNTHROID) 150 MCG tablet Take 150 mcg by mouth daily before breakfast.    . Liraglutide -Weight Management (SAXENDA) 18 MG/3ML SOPN Inject 3 mg into the skin daily. 5 pen 0  . lisinopril-hydrochlorothiazide (PRINZIDE,ZESTORETIC) 10-12.5 MG tablet Take 1 tablet by mouth daily.    . Multiple Vitamins-Minerals (HAIR SKIN & NAILS ADVANCED PO) Take 1 tablet by mouth daily.    . naproxen sodium (ANAPROX) 220 MG tablet Take 220 mg by mouth daily as needed.    . pantoprazole (PROTONIX) 40 MG tablet TAKE ONE TABLET BY MOUTH ONCE DAILY    . traZODone (DESYREL) 50 MG tablet Take 50 mg by mouth at bedtime as needed for sleep.    . Vitamin D, Ergocalciferol, (DRISDOL) 1.25 MG (50000 UT) CAPS capsule Take 1 capsule (50,000 Units total) by mouth every 7 (seven) days. 4 capsule 0   No current facility-administered medications on file prior to visit.     PAST MEDICAL HISTORY: Past Medical History:  Diagnosis Date  . Anxiety   . Back pain   . Cancer (HCC)    BASAL CELL SKIN CANCER  . Depression   . Fatty liver   . GERD (gastroesophageal reflux disease)   . Hashimoto's thyroiditis   . HTN (hypertension)   . Hyperlipidemia   . Hypothyroidism   . IBS (irritable bowel syndrome)   . Insomnia   . Joint pain   . Lactose intolerance   . Lichen sclerosus   . Obesity   . Psoriasis   . Sleep apnea     PAST SURGICAL HISTORY: Past Surgical  History:  Procedure Laterality Date  . ANTERIOR DISC ARTHROPLASTY     2004  . COLONOSCOPY  12/18/2011  . COLONOSCOPY WITH PROPOFOL N/A 04/04/2017   Procedure: COLONOSCOPY WITH PROPOFOL;  Surgeon: Manya Silvas, MD;  Location: [facility] ENDOSCOPY;  Service: Endoscopy;  Laterality: N/A;    SOCIAL HISTORY: Social History   Tobacco Use  . Smoking status: Never Smoker  . Smokeless tobacco: Never Used  Substance Use Topics  . Alcohol use: No  . Drug use: No    FAMILY HISTORY: Family History  Problem Relation Age of Onset  . Hypertension Mother   . Hyperlipidemia Mother   . Hypertension Father   . Breast cancer Neg Hx     ROS: Review of Systems  Constitutional: Positive for malaise/fatigue and weight loss.  Gastrointestinal: Negative for nausea and vomiting.  Musculoskeletal:  Negative muscle weakness    PHYSICAL EXAM: Blood pressure 108/71, pulse (!) 101, temperature 98.4 F (36.9 C), temperature source Oral, height 5\' 3"  (1.6 m), weight 228 lb (103.4 kg), last menstrual period 04/14/2016, SpO2 98 %. Body mass index is 40.39 kg/m. Physical Exam Vitals signs reviewed.  Constitutional:      Appearance: Normal appearance. She is obese.  Cardiovascular:     Rate and Rhythm: Normal rate.     Pulses: Normal pulses.  Pulmonary:     Effort: Pulmonary effort is normal.     Breath sounds: Normal breath sounds.  Musculoskeletal: Normal range of motion.  Skin:    General: Skin is warm and dry.  Neurological:     Mental Status: She is alert and oriented to person, place, and time.  Psychiatric:        Mood and Affect: Mood normal.        Behavior: Behavior normal.     RECENT LABS AND TESTS: BMET    Component Value Date/Time   NA 136 01/07/2019 0930   NA 140 10/02/2018 0831   K 4.1 01/07/2019 0930   CL 104 01/07/2019 0930   CO2 22 01/07/2019 0930   GLUCOSE 110 (H) 01/07/2019 0930   BUN 24 (H) 01/07/2019 0930   BUN 23 10/02/2018 0831   CREATININE 0.86  01/07/2019 0930   CALCIUM 9.5 01/07/2019 0930   GFRNONAA >60 01/07/2019 0930   GFRAA >60 01/07/2019 0930   Lab Results  Component Value Date   HGBA1C 5.1 11/23/2016   HGBA1C 5.3 08/10/2016   Lab Results  Component Value Date   INSULIN 46.2 (H) 10/02/2018   INSULIN 27.7 (H) 11/23/2016   INSULIN 38.9 (H) 08/10/2016   CBC    Component Value Date/Time   WBC 8.8 01/07/2019 0930   RBC 4.54 01/07/2019 0930   HGB 12.8 01/07/2019 0930   HGB 13.7 08/10/2016 1556   HCT 38.9 01/07/2019 0930   HCT 41.3 08/10/2016 1556   PLT 334 01/07/2019 0930   MCV 85.7 01/07/2019 0930   MCV 84 08/10/2016 1556   MCH 28.2 01/07/2019 0930   MCHC 32.9 01/07/2019 0930   RDW 13.9 01/07/2019 0930   RDW 14.0 08/10/2016 1556   LYMPHSABS 2.1 08/10/2016 1556   EOSABS 0.2 08/10/2016 1556   BASOSABS 0.1 08/10/2016 1556   Iron/TIBC/Ferritin/ %Sat No results found for: IRON, TIBC, FERRITIN, IRONPCTSAT Lipid Panel     Component Value Date/Time   CHOL 187 11/23/2016 0832   TRIG 101 11/23/2016 0832   HDL 66 11/23/2016 0832   LDLCALC 101 (H) 11/23/2016 0832   Hepatic Function Panel     Component Value Date/Time   PROT 7.9 01/07/2019 0930   PROT 7.5 10/02/2018 0831   ALBUMIN 4.3 01/07/2019 0930   ALBUMIN 4.7 10/02/2018 0831   AST 31 01/07/2019 0930   ALT 33 01/07/2019 0930   ALKPHOS 81 01/07/2019 0930   BILITOT 0.6 01/07/2019 0930   BILITOT 0.4 10/02/2018 0831      Component Value Date/Time   TSH 0.011 (L) 11/23/2016 0832   TSH 1.170 08/10/2016 1556      OBESITY BEHAVIORAL INTERVENTION VISIT  Today's visit was # 22   Starting weight: 240 lbs Starting date: 08/10/16 Today's weight : 228 lbs  Today's date: 03/11/2019 Total lbs lost to date: 12    ASK: We discussed the diagnosis of obesity with Katherine Smith today and Katherine Smith agreed to give [place] permission to discuss obesity behavioral modification therapy today.  ASSESS: Glenette has the diagnosis of obesity and her BMI today is 40.4  Sheriece is in the action stage of change   ADVISE: Kamela was educated on the multiple health risks of obesity as well as the benefit of weight loss to improve her health. She was advised of the need for long term treatment and the importance of lifestyle modifications to improve her current health and to decrease her risk of future health problems.  AGREE: Multiple dietary modification options and treatment options were discussed and  Meryem agreed to follow the recommendations documented in the above note.  ARRANGE: Bellarae was educated on the importance of frequent visits to treat obesity as outlined per CMS and USPSTF guidelines and agreed to schedule her next follow up appointment today.  I, Trixie Dredge, am acting as transcriptionist for Katherine Qua, MD  I have reviewed the above documentation for accuracy and completeness, and I agree with the above. - Katherine Qua, MD

## 2019-03-26 ENCOUNTER — Other Ambulatory Visit: Payer: Self-pay | Admitting: Obstetrics and Gynecology

## 2019-03-26 DIAGNOSIS — Z1231 Encounter for screening mammogram for malignant neoplasm of breast: Secondary | ICD-10-CM

## 2019-03-31 ENCOUNTER — Encounter (INDEPENDENT_AMBULATORY_CARE_PROVIDER_SITE_OTHER): Payer: Self-pay | Admitting: Family Medicine

## 2019-03-31 ENCOUNTER — Other Ambulatory Visit: Payer: Self-pay

## 2019-03-31 ENCOUNTER — Ambulatory Visit (INDEPENDENT_AMBULATORY_CARE_PROVIDER_SITE_OTHER): Payer: 59 | Admitting: Family Medicine

## 2019-03-31 VITALS — BP 95/64 | HR 96 | Temp 98.0°F | Ht 63.0 in | Wt 223.0 lb

## 2019-03-31 DIAGNOSIS — E559 Vitamin D deficiency, unspecified: Secondary | ICD-10-CM

## 2019-03-31 DIAGNOSIS — Z9189 Other specified personal risk factors, not elsewhere classified: Secondary | ICD-10-CM | POA: Diagnosis not present

## 2019-03-31 DIAGNOSIS — Z6841 Body Mass Index (BMI) 40.0 and over, adult: Secondary | ICD-10-CM

## 2019-03-31 DIAGNOSIS — Z6839 Body mass index (BMI) 39.0-39.9, adult: Secondary | ICD-10-CM

## 2019-03-31 DIAGNOSIS — R7303 Prediabetes: Secondary | ICD-10-CM

## 2019-03-31 MED ORDER — VITAMIN D (ERGOCALCIFEROL) 1.25 MG (50000 UNIT) PO CAPS: 50000.0000 [IU] | ORAL_CAPSULE | ORAL | 0 refills | Status: DC

## 2019-04-01 ENCOUNTER — Ambulatory Visit (INDEPENDENT_AMBULATORY_CARE_PROVIDER_SITE_OTHER): Payer: 59 | Admitting: Family Medicine

## 2019-04-01 ENCOUNTER — Other Ambulatory Visit (INDEPENDENT_AMBULATORY_CARE_PROVIDER_SITE_OTHER): Payer: Self-pay | Admitting: Family Medicine

## 2019-04-01 DIAGNOSIS — E66813 Obesity, class 3: Secondary | ICD-10-CM

## 2019-04-01 MED ORDER — BD PEN NEEDLE NANO 2ND GEN 32G X 4 MM MISC: 1.0000 | Freq: Two times a day (BID) | 0 refills | Status: DC

## 2019-04-01 MED ORDER — SAXENDA 18 MG/3ML ~~LOC~~ SOPN: 3.0000 mg | PEN_INJECTOR | Freq: Every day | SUBCUTANEOUS | 0 refills | Status: DC

## 2019-04-01 NOTE — Progress Notes (Signed)
Office: [phone]  /  Fax: [phone]   HPI:   Chief Complaint: OBESITY Katherine Smith is here to discuss her progress with her obesity treatment plan. She is keeping a food journal with 1600-2000 calories and 100+ grams of protein and is following her eating plan approximately 85-90% of the time. She states she is walking 20-30 minutes 1-2 times per week. Katherine Smith continues to do well with weight loss with journaling. She states she is meeting her protein goals routinely and hunger is controlled. No nausea, vomiting, or hypoglycemia.  Her weight is 223 lb (101.2 kg) today and has had a weight loss of 5 pounds over a period of 3 weeks since her last visit. She has lost 26 lbs since starting treatment with [place].  Pre-Diabetes Katherine Smith has a diagnosis of prediabetes based on her elevated Hgb A1c and was informed this puts her at greater risk of developing diabetes. She is working on diet and exercise and doing well on Saxenda. She denies nausea, vomiting, or hypoglycemia.  At risk for diabetes Katherine Smith is at higher than average risk for developing diabetes due to her obesity. She currently denies polyuria or polydipsia.  Vitamin D deficiency Katherine Smith has a diagnosis of Vitamin D deficiency, which is stable. She is currently taking prescription Vit D and denies nausea, vomiting or muscle weakness. She is due for labs soon.  ASSESSMENT AND PLAN:  Prediabetes  Vitamin D deficiency - Plan: Vitamin D, Ergocalciferol, (DRISDOL) 1.25 MG (50000 UT) CAPS capsule  At risk for diabetes mellitus  Class 2 severe obesity with serious comorbidity and body mass index (BMI) of 39.0 to 39.9 in adult, unspecified obesity type Katherine Smith)  PLAN:  Pre-Diabetes Katherine Smith will continue to work on weight loss, exercise, and decreasing simple carbohydrates in her diet to help decrease the risk of diabetes. We dicussed metformin including benefits and risks. She was informed that eating too many simple carbohydrates or too many  calories at one sitting increases the likelihood of GI side effects. Katherine Smith will continue [place] and diet. She will have labs checked at her PCP's office soon.  Diabetes risk counseling Katherine Smith was given extended (15 minutes) diabetes prevention counseling today. She is 58 y.o. female and has risk factors for diabetes including obesity. We discussed intensive lifestyle modifications today with an emphasis on weight loss as well as increasing exercise and decreasing simple carbohydrates in her diet.  Vitamin D Deficiency Katherine Smith was informed that low Vitamin D levels contributes to fatigue and are associated with obesity, breast, and colon cancer. She agrees to continue to take prescription Vit D @ 50,000 IU every week #4 with 0 refills and will have labs checked at her PCP's office soon. She was informed of the risk of over-replacement of Vitamin D and agrees to not increase her dose unless she discusses this with [place] first. Katherine Smith agrees to follow-up with our clinic in 2 weeks.  Obesity Katherine Smith is currently in the action stage of change. As such, her goal is to continue with weight loss efforts. She has agreed to keep a food journal with 1600-2000 calories and 100+ grams of protein.  Katherine Smith has been instructed to work up to a goal of 150 minutes of combined cardio and strengthening exercise per week for weight loss and overall health benefits. We discussed the following Behavioral Modification Strategies today: increasing lean protein intake and decreasing simple carbohydrates. Katherine Smith was given a refill on her Saxenda for 1 month.  Katherine Smith has agreed to follow-up with our clinic  in 2 weeks for a TH visit and 4 weeks for an in-office visit. She was informed of the importance of frequent follow-up visits to maximize her success with intensive lifestyle modifications for her multiple health conditions.  ALLERGIES: Allergies  Allergen Reactions  . Atorvastatin     Other reaction(s): Muscle Pain  .  Dexfenfluramine     Other reaction(s): Unknown    MEDICATIONS: Current Outpatient Medications on File Prior to Visit  Medication Sig Dispense Refill  . Aspirin-Acetaminophen-Caffeine (EXCEDRIN MIGRAINE PO) Take 1 tablet by mouth daily as needed.    . Calcipotriene-Betameth Diprop (ENSTILAR) 0.005-0.064 % FOAM Apply topically.    . Cholecalciferol (VITAMIN D) 125 MCG (5000 UT) CAPS Take 1 capsule by mouth daily.    . clobetasol (TEMOVATE) 0.05 % external solution Apply 1 application topically 2 (two) times daily.    Marland Kitchen ezetimibe (ZETIA) 10 MG tablet TAKE 1 TABLET BY MOUTH ONCE DAILY.    . famciclovir (FAMVIR) 500 MG tablet Take 1 tablet (500 mg total) 3 (three) times daily by mouth. 21 tablet 0  . Insulin Pen Needle (BD PEN NEEDLE NANO 2ND GEN) 32G X 4 MM MISC 1 Package by Does not apply route 2 (two) times daily. 100 each 0  . levothyroxine (SYNTHROID) 150 MCG tablet Take 150 mcg by mouth daily before breakfast.    . Liraglutide -Weight Management (SAXENDA) 18 MG/3ML SOPN Inject 3 mg into the skin daily. 5 pen 0  . lisinopril-hydrochlorothiazide (PRINZIDE,ZESTORETIC) 10-12.5 MG tablet Take 1 tablet by mouth daily.    . Multiple Vitamins-Minerals (HAIR SKIN & NAILS ADVANCED PO) Take 1 tablet by mouth daily.    . naproxen sodium (ANAPROX) 220 MG tablet Take 220 mg by mouth daily as needed.    . pantoprazole (PROTONIX) 40 MG tablet TAKE ONE TABLET BY MOUTH ONCE DAILY    . traZODone (DESYREL) 50 MG tablet Take 50 mg by mouth at bedtime as needed for sleep.     No current facility-administered medications on file prior to visit.     PAST MEDICAL HISTORY: Past Medical History:  Diagnosis Date  . Anxiety   . Back pain   . Cancer (HCC)    BASAL CELL SKIN CANCER  . Depression   . Fatty liver   . GERD (gastroesophageal reflux disease)   . Hashimoto's thyroiditis   . HTN (hypertension)   . Hyperlipidemia   . Hypothyroidism   . IBS (irritable bowel syndrome)   . Insomnia   . Joint pain    . Lactose intolerance   . Lichen sclerosus   . Obesity   . Psoriasis   . Sleep apnea     PAST SURGICAL HISTORY: Past Surgical History:  Procedure Laterality Date  . ANTERIOR DISC ARTHROPLASTY     2004  . COLONOSCOPY  12/18/2011  . COLONOSCOPY WITH PROPOFOL N/A 04/04/2017   Procedure: COLONOSCOPY WITH PROPOFOL;  Surgeon: Manya Silvas, MD;  Location: [facility] END[facility]COPY;  Service: Endoscopy;  Laterality: N/A;    SOCIAL HISTORY: Social History   Tobacco Use  . Smoking status: Never Smoker  . Smokeless tobacco: Never Used  Substance Use Topics  . Alcohol use: No  . Drug use: No    FAMILY HISTORY: Family History  Problem Relation Age of Onset  . Hypertension Mother   . Hyperlipidemia Mother   . Hypertension Father   . Breast cancer Neg Hx    ROS: Review of Systems  Gastrointestinal: Negative for nausea and vomiting.  Musculoskeletal:  Negative for muscle weakness.  Endo/Heme/Allergies:       Negative for hypoglycemia.   PHYSICAL EXAM: Blood pressure 95/64, pulse 96, temperature 98 F (36.7 C), temperature source Oral, height 5\' 3"  (1.6 m), weight 223 lb (101.2 kg), last menstrual period 04/14/2016, SpO2 97 %. Body mass index is 39.5 kg/m. Physical Exam Vitals signs reviewed.  Constitutional:      Appearance: Normal appearance. She is obese.  Cardiovascular:     Rate and Rhythm: Normal rate.     Pulses: Normal pulses.  Pulmonary:     Effort: Pulmonary effort is normal.     Breath sounds: Normal breath sounds.  Musculoskeletal: Normal range of motion.  Skin:    General: Skin is warm and dry.  Neurological:     Mental Status: She is alert and oriented to person, place, and time.  Psychiatric:        Behavior: Behavior normal.   RECENT LABS AND TESTS: BMET    Component Value Date/Time   NA 136 01/07/2019 0930   NA 140 10/02/2018 0831   K 4.1 01/07/2019 0930   CL 104 01/07/2019 0930   CO2 22 01/07/2019 0930   GLUCOSE 110 (H) 01/07/2019 0930    BUN 24 (H) 01/07/2019 0930   BUN 23 10/02/2018 0831   CREATININE 0.86 01/07/2019 0930   CALCIUM 9.5 01/07/2019 0930   GFRNONAA >60 01/07/2019 0930   GFRAA >60 01/07/2019 0930   Lab Results  Component Value Date   HGBA1C 5.1 11/23/2016   HGBA1C 5.3 08/10/2016   Lab Results  Component Value Date   INSULIN 46.2 (H) 10/02/2018   INSULIN 27.7 (H) 11/23/2016   INSULIN 38.9 (H) 08/10/2016   CBC    Component Value Date/Time   WBC 8.8 01/07/2019 0930   RBC 4.54 01/07/2019 0930   HGB 12.8 01/07/2019 0930   HGB 13.7 08/10/2016 1556   HCT 38.9 01/07/2019 0930   HCT 41.3 08/10/2016 1556   PLT 334 01/07/2019 0930   MCV 85.7 01/07/2019 0930   MCV 84 08/10/2016 1556   MCH 28.2 01/07/2019 0930   MCHC 32.9 01/07/2019 0930   RDW 13.9 01/07/2019 0930   RDW 14.0 08/10/2016 1556   LYMPHSABS 2.1 08/10/2016 1556   EOSABS 0.2 08/10/2016 1556   BASOSABS 0.1 08/10/2016 1556   Iron/TIBC/Ferritin/ %Sat No results found for: IRON, TIBC, FERRITIN, IRONPCTSAT Lipid Panel     Component Value Date/Time   CHOL 187 11/23/2016 0832   TRIG 101 11/23/2016 0832   HDL 66 11/23/2016 0832   LDLCALC 101 (H) 11/23/2016 0832   Hepatic Function Panel     Component Value Date/Time   PROT 7.9 01/07/2019 0930   PROT 7.5 10/02/2018 0831   ALBUMIN 4.3 01/07/2019 0930   ALBUMIN 4.7 10/02/2018 0831   AST 31 01/07/2019 0930   ALT 33 01/07/2019 0930   ALKPHOS 81 01/07/2019 0930   BILITOT 0.6 01/07/2019 0930   BILITOT 0.4 10/02/2018 0831      Component Value Date/Time   TSH 0.011 (L) 11/23/2016 0832   TSH 1.170 08/10/2016 1556   Results for HENRETTA, QUIST (MRN [id number]) as of 04/01/2019 13:36  Ref. Range 10/02/2018 08:31  Vitamin D, 25-Hydroxy Latest Ref Range: 30.0 - 100.0 ng/mL 27.0 (L)   OBESITY BEHAVIORAL INTERVENTION VISIT  Today's visit was #11   Starting weight: 249 lbs Starting date: 10/02/2018 Today's weight: 223 lbs  Today's date: 03/31/2019 Total lbs lost to date: 26    03/31/2019   Height  5\' 3"  (1.6 m)  Weight 223 lb (101.2 kg)  BMI (Calculated) 39.51  BLOOD PRESSURE - SYSTOLIC 95  BLOOD PRESSURE - DIASTOLIC 64   Body Fat % 56.9 %  Total Body Water (lbs) 78.6 lbs   ASK: We discussed the diagnosis of obesity with Katherine Smith today and Katherine Smith agreed to give [place] permission to discuss obesity behavioral modification therapy today.  ASSESS: Katherine Smith has the diagnosis of obesity and her BMI today is 39.6. Katherine Smith is in the action stage of change.   ADVISE: Katherine Smith was educated on the multiple health risks of obesity as well as the benefit of weight loss to improve her health. She was advised of the need for long term treatment and the importance of lifestyle modifications to improve her current health and to decrease her risk of future health problems.  AGREE: Multiple dietary modification options and treatment options were discussed and  Katherine Smith agreed to follow the recommendations documented in the above note.  ARRANGE: Katherine Smith was educated on the importance of frequent visits to treat obesity as outlined per CMS and USPSTF guidelines and agreed to schedule her next follow up appointment today.  I, Michaelene Song, am acting as Location manager for Dennard Nip, MD I have reviewed the above documentation for accuracy and completeness, and I agree with the above. -Dennard Nip, MD

## 2019-04-09 DIAGNOSIS — Z0000 Encounter for general adult medical examination without abnormal findings: Secondary | ICD-10-CM | POA: Diagnosis not present

## 2019-04-09 DIAGNOSIS — I10 Essential (primary) hypertension: Secondary | ICD-10-CM | POA: Diagnosis not present

## 2019-04-09 DIAGNOSIS — E782 Mixed hyperlipidemia: Secondary | ICD-10-CM | POA: Diagnosis not present

## 2019-04-14 DIAGNOSIS — E782 Mixed hyperlipidemia: Secondary | ICD-10-CM | POA: Diagnosis not present

## 2019-04-14 DIAGNOSIS — E039 Hypothyroidism, unspecified: Secondary | ICD-10-CM | POA: Diagnosis not present

## 2019-04-14 DIAGNOSIS — I10 Essential (primary) hypertension: Secondary | ICD-10-CM | POA: Diagnosis not present

## 2019-04-23 ENCOUNTER — Other Ambulatory Visit: Payer: Self-pay

## 2019-04-23 ENCOUNTER — Encounter (INDEPENDENT_AMBULATORY_CARE_PROVIDER_SITE_OTHER): Payer: Self-pay | Admitting: Family Medicine

## 2019-04-23 ENCOUNTER — Telehealth (INDEPENDENT_AMBULATORY_CARE_PROVIDER_SITE_OTHER): Payer: 59 | Admitting: Family Medicine

## 2019-04-23 DIAGNOSIS — I10 Essential (primary) hypertension: Secondary | ICD-10-CM | POA: Diagnosis not present

## 2019-04-23 DIAGNOSIS — Z6839 Body mass index (BMI) 39.0-39.9, adult: Secondary | ICD-10-CM | POA: Diagnosis not present

## 2019-04-23 NOTE — Progress Notes (Signed)
Office: [phone]  /  Fax: [phone] TeleHealth Visit:  Katherine Smith has verbally consented to this TeleHealth visit today. The patient is located at home, the provider is located at the [facility] office. The participants in this visit include the listed provider and patient. The visit was conducted today via FaceTime.  HPI:   Chief Complaint: OBESITY Katherine Smith is here to discuss her progress with her obesity treatment plan. She is keeping a food journal with 1660 calories and is following her eating plan approximately 80% of the time. She states she is walking 20-30 minutes 3 times per week. [name] continues to do well with diet and exercise and thinks she has lost 2-3 lbs since our last in-office visit. Hunger is controlled on Saxenda. She is happy with journaling and is meeting her protein goals well. We were unable to weigh the patient today for this TeleHealth visit. She feels as if she has lost weight since her last visit. She has lost 26 lbs since starting treatment with [place].  Hypertension Katherine Smith is a 58 y.o. female with hypertension and is on lisinopril/HCTZ and Saxenda for blood pressure.  Katherine Smith denies chest pain or shortness of breath on exertion. She is working weight loss to help control her blood pressure with the goal of decreasing her risk of heart attack and stroke. Katherine Smith states her blood pressure this a.m. was controlled at 125/76.  ASSESSMENT AND PLAN:  Essential hypertension  Class 2 severe obesity with serious comorbidity and body mass index (BMI) of 39.0 to 39.9 in adult, unspecified obesity type ([place])  PLAN:  Hypertension We discussed sodium restriction, working on healthy weight loss, and a regular exercise program as the means to achieve improved blood pressure control. Katherine Smith agreed with this plan and agreed to follow up as directed. We will continue to monitor her blood pressure as well as her progress with the above  lifestyle modifications. Katherine Smith will continue diet and exercise and will continue her medications as prescribed. She will watch for signs of hypotension as she continues her lifestyle modifications. She will have labs checked at her next in-office visit.  I spent > than 50% of the 15 minute visit on counseling as documented in the note.  Obesity Modelle is currently in the action stage of change. As such, her goal is to continue with weight loss efforts. She has agreed to keep a food journal with 1600-2000 calories and 100+ grams of protein. Katherine Smith has been instructed to work up to a goal of 150 minutes of combined cardio and strengthening exercise per week for weight loss and overall health benefits. We discussed the following Behavioral Modification Strategies today: increasing lean protein intake.  Katherine Smith has agreed to follow-up with our clinic in 3 weeks. She was informed of the importance of frequent follow-up visits to maximize her success with intensive lifestyle modifications for her multiple health conditions.  ALLERGIES: Allergies  Allergen Reactions  . Atorvastatin     Other reaction(s): Muscle Pain  . Dexfenfluramine     Other reaction(s): Unknown    MEDICATIONS: Current Outpatient Medications on File Prior to Visit  Medication Sig Dispense Refill  . Aspirin-Acetaminophen-Caffeine (EXCEDRIN MIGRAINE PO) Take 1 tablet by mouth daily as needed.    . Calcipotriene-Betameth Diprop (ENSTILAR) 0.005-0.064 % FOAM Apply topically.    . Cholecalciferol (VITAMIN D) 125 MCG (5000 UT) CAPS Take 1 capsule by mouth daily.    . clobetasol (TEMOVATE) 0.05 % external solution  Apply 1 application topically 2 (two) times daily.    Marland Kitchen ezetimibe (ZETIA) 10 MG tablet TAKE 1 TABLET BY MOUTH ONCE DAILY.    . famciclovir (FAMVIR) 500 MG tablet Take 1 tablet (500 mg total) 3 (three) times daily by mouth. 21 tablet 0  . Insulin Pen Needle (BD PEN NEEDLE NANO 2ND GEN) 32G X 4 MM MISC 1 Package by Does  not apply route 2 (two) times daily. 100 each 0  . levothyroxine (SYNTHROID) 150 MCG tablet Take 150 mcg by mouth daily before breakfast.    . Liraglutide -Weight Management (SAXENDA) 18 MG/3ML SOPN Inject 3 mg into the skin daily. 5 pen 0  . lisinopril-hydrochlorothiazide (PRINZIDE,ZESTORETIC) 10-12.5 MG tablet Take 1 tablet by mouth daily.    . Multiple Vitamins-Minerals (HAIR SKIN & NAILS ADVANCED PO) Take 1 tablet by mouth daily.    . naproxen sodium (ANAPROX) 220 MG tablet Take 220 mg by mouth daily as needed.    . pantoprazole (PROTONIX) 40 MG tablet TAKE ONE TABLET BY MOUTH ONCE DAILY    . traZODone (DESYREL) 50 MG tablet Take 50 mg by mouth at bedtime as needed for sleep.    . Vitamin D, Ergocalciferol, (DRISDOL) 1.25 MG (50000 UT) CAPS capsule Take 1 capsule (50,000 Units total) by mouth every 7 (seven) days. 4 capsule 0   No current facility-administered medications on file prior to visit.     PAST MEDICAL HISTORY: Past Medical History:  Diagnosis Date  . Anxiety   . Back pain   . Cancer (HCC)    BASAL CELL SKIN CANCER  . Depression   . Fatty liver   . GERD (gastroesophageal reflux disease)   . Hashimoto's thyroiditis   . HTN (hypertension)   . Hyperlipidemia   . Hypothyroidism   . IBS (irritable bowel syndrome)   . Insomnia   . Joint pain   . Lactose intolerance   . Lichen sclerosus   . Obesity   . Psoriasis   . Sleep apnea     PAST SURGICAL HISTORY: Past Surgical History:  Procedure Laterality Date  . ANTERIOR DISC ARTHROPLASTY     2004  . COLONOSCOPY  12/18/2011  . COLONOSCOPY WITH PROPOFOL N/A 04/04/2017   Procedure: COLONOSCOPY WITH PROPOFOL;  Surgeon: Manya Silvas, MD;  Location: [facility] ENDOSCOPY;  Service: Endoscopy;  Laterality: N/A;    SOCIAL HISTORY: Social History   Tobacco Use  . Smoking status: Never Smoker  . Smokeless tobacco: Never Used  Substance Use Topics  . Alcohol use: No  . Drug use: No    FAMILY HISTORY: Family History   Problem Relation Age of Onset  . Hypertension Mother   . Hyperlipidemia Mother   . Hypertension Father   . Breast cancer Neg Hx    ROS: Review of Systems  Respiratory: Negative for shortness of breath.   Cardiovascular: Negative for chest pain.   PHYSICAL EXAM: Pt in no acute distress  RECENT LABS AND TESTS: BMET    Component Value Date/Time   NA 136 01/07/2019 0930   NA 140 10/02/2018 0831   K 4.1 01/07/2019 0930   CL 104 01/07/2019 0930   CO2 22 01/07/2019 0930   GLUCOSE 110 (H) 01/07/2019 0930   BUN 24 (H) 01/07/2019 0930   BUN 23 10/02/2018 0831   CREATININE 0.86 01/07/2019 0930   CALCIUM 9.5 01/07/2019 0930   GFRNONAA >60 01/07/2019 0930   GFRAA >60 01/07/2019 0930   Lab Results  Component Value Date  HGBA1C 5.1 11/23/2016   HGBA1C 5.3 08/10/2016   Lab Results  Component Value Date   INSULIN 46.2 (H) 10/02/2018   INSULIN 27.7 (H) 11/23/2016   INSULIN 38.9 (H) 08/10/2016   CBC    Component Value Date/Time   WBC 8.8 01/07/2019 0930   RBC 4.54 01/07/2019 0930   HGB 12.8 01/07/2019 0930   HGB 13.7 08/10/2016 1556   HCT 38.9 01/07/2019 0930   HCT 41.3 08/10/2016 1556   PLT 334 01/07/2019 0930   MCV 85.7 01/07/2019 0930   MCV 84 08/10/2016 1556   MCH 28.2 01/07/2019 0930   MCHC 32.9 01/07/2019 0930   RDW 13.9 01/07/2019 0930   RDW 14.0 08/10/2016 1556   LYMPHSABS 2.1 08/10/2016 1556   EOSABS 0.2 08/10/2016 1556   BASOSABS 0.1 08/10/2016 1556   Iron/TIBC/Ferritin/ %Sat No results found for: IRON, TIBC, FERRITIN, IRONPCTSAT Lipid Panel     Component Value Date/Time   CHOL 187 11/23/2016 0832   TRIG 101 11/23/2016 0832   HDL 66 11/23/2016 0832   LDLCALC 101 (H) 11/23/2016 0832   Hepatic Function Panel     Component Value Date/Time   PROT 7.9 01/07/2019 0930   PROT 7.5 10/02/2018 0831   ALBUMIN 4.3 01/07/2019 0930   ALBUMIN 4.7 10/02/2018 0831   AST 31 01/07/2019 0930   ALT 33 01/07/2019 0930   ALKPHOS 81 01/07/2019 0930   BILITOT 0.6  01/07/2019 0930   BILITOT 0.4 10/02/2018 0831      Component Value Date/Time   TSH 0.011 (L) 11/23/2016 0832   TSH 1.170 08/10/2016 1556   Results for JAYLEN, APPLER (MRN [id number]) as of 04/23/2019 15:26  Ref. Range 10/02/2018 08:31  Vitamin D, 25-Hydroxy Latest Ref Range: 30.0 - 100.0 ng/mL 27.0 (L)   I, Michaelene Song, am acting as Location manager for Dennard Nip, MD I have reviewed the above documentation for accuracy and completeness, and I agree with the above. -Dennard Nip, MD

## 2019-04-29 ENCOUNTER — Ambulatory Visit
Admission: RE | Admit: 2019-04-29 | Discharge: 2019-04-29 | Disposition: A | Discharge status: Home / Self Care | Payer: 59 | Source: Ambulatory Visit | Attending: Obstetrics and Gynecology | Admitting: Obstetrics and Gynecology

## 2019-04-29 DIAGNOSIS — Z1231 Encounter for screening mammogram for malignant neoplasm of breast: Secondary | ICD-10-CM | POA: Diagnosis not present

## 2019-05-08 ENCOUNTER — Ambulatory Visit (INDEPENDENT_AMBULATORY_CARE_PROVIDER_SITE_OTHER): Payer: 59 | Admitting: Family Medicine

## 2019-05-08 ENCOUNTER — Other Ambulatory Visit: Payer: Self-pay

## 2019-05-08 VITALS — BP 114/62 | HR 95 | Temp 97.8°F | Ht 63.0 in | Wt 222.0 lb

## 2019-05-08 DIAGNOSIS — E559 Vitamin D deficiency, unspecified: Secondary | ICD-10-CM | POA: Diagnosis not present

## 2019-05-08 DIAGNOSIS — Z6839 Body mass index (BMI) 39.0-39.9, adult: Secondary | ICD-10-CM

## 2019-05-08 DIAGNOSIS — E8881 Metabolic syndrome: Secondary | ICD-10-CM | POA: Diagnosis not present

## 2019-05-08 DIAGNOSIS — Z9189 Other specified personal risk factors, not elsewhere classified: Secondary | ICD-10-CM

## 2019-05-08 DIAGNOSIS — G4733 Obstructive sleep apnea (adult) (pediatric): Secondary | ICD-10-CM | POA: Diagnosis not present

## 2019-05-08 DIAGNOSIS — I10 Essential (primary) hypertension: Secondary | ICD-10-CM | POA: Diagnosis not present

## 2019-05-08 MED ORDER — VITAMIN D (ERGOCALCIFEROL) 1.25 MG (50000 UNIT) PO CAPS: 50000.0000 [IU] | ORAL_CAPSULE | ORAL | 0 refills | Status: DC

## 2019-05-08 MED ORDER — SAXENDA 18 MG/3ML ~~LOC~~ SOPN: 3.0000 mg | PEN_INJECTOR | Freq: Every day | SUBCUTANEOUS | 0 refills | Status: DC

## 2019-05-09 LAB — COMPREHENSIVE METABOLIC PANEL
ALT: 22 IU/L (ref 0–32)
AST: 20 IU/L (ref 0–40)
Albumin/Globulin Ratio: 1.7 (ref 1.2–2.2)
Albumin: 4.5 g/dL (ref 3.8–4.9)
Alkaline Phosphatase: 80 IU/L (ref 39–117)
BUN/Creatinine Ratio: 23 (ref 9–23)
BUN: 21 mg/dL (ref 6–24)
Bilirubin Total: 0.5 mg/dL (ref 0.0–1.2)
CO2: 23 mmol/L (ref 20–29)
Calcium: 10.1 mg/dL (ref 8.7–10.2)
Chloride: 102 mmol/L (ref 96–106)
Creatinine, Ser: 0.91 mg/dL (ref 0.57–1.00)
GFR calc Af Amer: 80 mL/min/{1.73_m2} (ref >59)
GFR calc non Af Amer: 69 mL/min/{1.73_m2} (ref >59)
Globulin, Total: 2.7 g/dL (ref 1.5–4.5)
Glucose: 103 mg/dL — ABNORMAL HIGH (ref 65–99)
Potassium: 4.5 mmol/L (ref 3.5–5.2)
Sodium: 139 mmol/L (ref 134–144)
Total Protein: 7.2 g/dL (ref 6.0–8.5)

## 2019-05-09 LAB — INSULIN, RANDOM: INSULIN: 25.8 u[IU]/mL — ABNORMAL HIGH (ref 2.6–24.9)

## 2019-05-13 NOTE — Progress Notes (Signed)
Office: [phone]  /  Fax: [phone]   HPI:   Chief Complaint: OBESITY Katherine Smith is here to discuss her progress with her obesity treatment plan. She is on the Category 3 plan and is following her eating plan approximately 80% of the time. She states she is walking 30 minutes 3 times per week. Katherine Smith was on vacation and had some celebration eating. She is ready to get back on track. She has increased Saxenda to 2.4 and notes decreased hunger. Her weight is 222 lb (100.7 kg) today and has had a weight loss of 1 pound over a period of 6 weeks since her last in-office visit. She has lost 27 lbs since starting treatment with [place].  Vitamin D deficiency Katherine Smith has a diagnosis of Vitamin D deficiency, which is not yet at goal. She had labs drawn last month at [facility]. She is currently stable on prescription Vit D and denies nausea, vomiting or muscle weakness.  Insulin Resistance Katherine Smith has a diagnosis of insulin resistance based on her elevated fasting insulin level >5. Although Katherine Smith's blood glucose readings are still under good control, insulin resistance puts her at greater risk of metabolic syndrome and diabetes. She is working on diet and is on liraglutide. No nausea, vomiting, or hypoglycemia. Decreased polyphagia.  At risk for diabetes Katherine Smith is at higher than average risk for developing diabetes due to her obesity. She currently denies polyuria or polydipsia.  ASSESSMENT AND PLAN:  Vitamin D deficiency - Plan: Vitamin D, Ergocalciferol, (DRISDOL) 1.25 MG (50000 UT) CAPS capsule  Insulin resistance - Plan: Comprehensive metabolic panel, Insulin, random  At risk for diabetes mellitus  Class 2 severe obesity with serious comorbidity and body mass index (BMI) of 39.0 to 39.9 in adult, unspecified obesity type (Katherine Smith) - Plan: Liraglutide -Weight Management (SAXENDA) 18 MG/3ML SOPN  PLAN:  Vitamin D Deficiency Katherine Smith was informed that low Vitamin D levels contributes to fatigue and are  associated with obesity, breast, and colon cancer. She agrees to continue to take prescription Vit D @ 50,000 IU every week #4 with 0 refills and will follow-up for routine testing of Vitamin D, at least 2-3 times per year. She was informed of the risk of over-replacement of Vitamin D and agrees to not increase her dose unless she discusses this with [place] first. Katherine Smith agrees to follow-up with our clinic in 2 weeks.  Insulin Resistance Katherine Smith will continue to work on weight loss, exercise, and decreasing simple carbohydrates in her diet to help decrease the risk of diabetes. We dicussed metformin including benefits and risks. She was informed that eating too many simple carbohydrates or too many calories at one sitting increases the likelihood of GI side effects. Katherine Smith will continue diet and exercise and follow-up as directed to monitor her progress. She will have labs checked today.  Diabetes risk counseling Katherine Smith was given extended (15 minutes) diabetes prevention counseling today. She is 57 y.o. female and has risk factors for diabetes including obesity. We discussed intensive lifestyle modifications today with an emphasis on weight loss as well as increasing exercise and decreasing simple carbohydrates in her diet.  Obesity Katherine Smith is currently in the action stage of change. As such, her goal is to continue with weight loss efforts. She has agreed to follow the Category 3 plan. Katherine Smith has been instructed to work up to a goal of 150 minutes of combined cardio and strengthening exercise per week for weight loss and overall health benefits. We discussed the following Behavioral Modification Strategies  today: work on meal planning and easy cooking plans.  Katherine Smith was given a refill on Saxenda 3 mg SQ daily #5 pens with 0 refills. She agrees to follow-up with our clinic in 2 weeks.  Katherine Smith has agreed to follow-up with our clinic in 2 weeks. She was informed of the importance of frequent follow-up  visits to maximize her success with intensive lifestyle modifications for her multiple health conditions.  ALLERGIES: Allergies  Allergen Reactions  . Atorvastatin     Other reaction(s): Muscle Pain  . Dexfenfluramine     Other reaction(s): Unknown    MEDICATIONS: Current Outpatient Medications on File Prior to Visit  Medication Sig Dispense Refill  . Aspirin-Acetaminophen-Caffeine (EXCEDRIN MIGRAINE PO) Take 1 tablet by mouth daily as needed.    . Calcipotriene-Betameth Diprop (ENSTILAR) 0.005-0.064 % FOAM Apply topically.    . clobetasol (TEMOVATE) 0.05 % external solution Apply 1 application topically 2 (two) times daily.    Marland Kitchen ezetimibe (ZETIA) 10 MG tablet TAKE 1 TABLET BY MOUTH ONCE DAILY.    Marland Kitchen Insulin Pen Needle (BD PEN NEEDLE NANO 2ND GEN) 32G X 4 MM MISC 1 Package by Does not apply route 2 (two) times daily. 100 each 0  . levothyroxine (SYNTHROID) 150 MCG tablet Take 150 mcg by mouth daily before breakfast.    . lisinopril-hydrochlorothiazide (PRINZIDE,ZESTORETIC) 10-12.5 MG tablet Take 1 tablet by mouth daily.    . Multiple Vitamins-Minerals (HAIR SKIN & NAILS ADVANCED PO) Take 1 tablet by mouth daily.    . naproxen sodium (ANAPROX) 220 MG tablet Take 220 mg by mouth daily as needed.    . pantoprazole (PROTONIX) 40 MG tablet TAKE ONE TABLET BY MOUTH ONCE DAILY    . traZODone (DESYREL) 50 MG tablet Take 50 mg by mouth at bedtime as needed for sleep.     No current facility-administered medications on file prior to visit.     PAST MEDICAL HISTORY: Past Medical History:  Diagnosis Date  . Anxiety   . Back pain   . Cancer (HCC)    BASAL CELL SKIN CANCER  . Depression   . Fatty liver   . GERD (gastroesophageal reflux disease)   . Hashimoto's thyroiditis   . HTN (hypertension)   . Hyperlipidemia   . Hypothyroidism   . IBS (irritable bowel syndrome)   . Insomnia   . Joint pain   . Lactose intolerance   . Lichen sclerosus   . Obesity   . Psoriasis   . Sleep apnea      PAST SURGICAL HISTORY: Past Surgical History:  Procedure Laterality Date  . ANTERIOR DISC ARTHROPLASTY     2004  . COLONOSCOPY  12/18/2011  . COLONOSCOPY WITH PROPOFOL N/A 04/04/2017   Procedure: COLONOSCOPY WITH PROPOFOL;  Surgeon: Manya Silvas, MD;  Location: [facility] ENDOSCOPY;  Service: Endoscopy;  Laterality: N/A;    SOCIAL HISTORY: Social History   Tobacco Use  . Smoking status: Never Smoker  . Smokeless tobacco: Never Used  Substance Use Topics  . Alcohol use: No  . Drug use: No    FAMILY HISTORY: Family History  Problem Relation Age of Onset  . Hypertension Mother   . Hyperlipidemia Mother   . Hypertension Father   . Breast cancer Neg Hx    ROS: Review of Systems  Gastrointestinal: Negative for nausea and vomiting.  Musculoskeletal:       Negative for muscle weakness.  Endo/Heme/Allergies:       Negative for hypoglycemia.   PHYSICAL EXAM: Blood  pressure 114/62, pulse 95, temperature 97.8 F (36.6 C), temperature source Oral, height 5\' 3"  (1.6 m), weight 222 lb (100.7 kg), last menstrual period 04/14/2016, SpO2 98 %. Body mass index is 39.33 kg/m. Physical Exam Vitals signs reviewed.  Constitutional:      Appearance: Normal appearance. She is obese.  Cardiovascular:     Rate and Rhythm: Normal rate.     Pulses: Normal pulses.  Pulmonary:     Effort: Pulmonary effort is normal.     Breath sounds: Normal breath sounds.  Musculoskeletal: Normal range of motion.  Skin:    General: Skin is warm and dry.  Neurological:     Mental Status: She is alert and oriented to person, place, and time.  Psychiatric:        Behavior: Behavior normal.   RECENT LABS AND TESTS: BMET    Component Value Date/Time   NA 139 05/08/2019 1252   K 4.5 05/08/2019 1252   CL 102 05/08/2019 1252   CO2 23 05/08/2019 1252   GLUCOSE 103 (H) 05/08/2019 1252   GLUCOSE 110 (H) 01/07/2019 0930   BUN 21 05/08/2019 1252   CREATININE 0.91 05/08/2019 1252   CALCIUM 10.1  05/08/2019 1252   GFRNONAA 69 05/08/2019 1252   GFRAA 80 05/08/2019 1252   Lab Results  Component Value Date   HGBA1C 5.1 11/23/2016   HGBA1C 5.3 08/10/2016   Lab Results  Component Value Date   INSULIN 25.8 (H) 05/08/2019   INSULIN 46.2 (H) 10/02/2018   INSULIN 27.7 (H) 11/23/2016   INSULIN 38.9 (H) 08/10/2016   CBC    Component Value Date/Time   WBC 8.8 01/07/2019 0930   RBC 4.54 01/07/2019 0930   HGB 12.8 01/07/2019 0930   HGB 13.7 08/10/2016 1556   HCT 38.9 01/07/2019 0930   HCT 41.3 08/10/2016 1556   PLT 334 01/07/2019 0930   MCV 85.7 01/07/2019 0930   MCV 84 08/10/2016 1556   MCH 28.2 01/07/2019 0930   MCHC 32.9 01/07/2019 0930   RDW 13.9 01/07/2019 0930   RDW 14.0 08/10/2016 1556   LYMPHSABS 2.1 08/10/2016 1556   EOSABS 0.2 08/10/2016 1556   BASOSABS 0.1 08/10/2016 1556   Iron/TIBC/Ferritin/ %Sat No results found for: IRON, TIBC, FERRITIN, IRONPCTSAT Lipid Panel     Component Value Date/Time   CHOL 187 11/23/2016 0832   TRIG 101 11/23/2016 0832   HDL 66 11/23/2016 0832   LDLCALC 101 (H) 11/23/2016 0832   Hepatic Function Panel     Component Value Date/Time   PROT 7.2 05/08/2019 1252   ALBUMIN 4.5 05/08/2019 1252   AST 20 05/08/2019 1252   ALT 22 05/08/2019 1252   ALKPHOS 80 05/08/2019 1252   BILITOT 0.5 05/08/2019 1252      Component Value Date/Time   TSH 0.011 (L) 11/23/2016 0832   TSH 1.170 08/10/2016 1556   Results for MALAIJAH, LESSARD (MRN [id number]) as of 05/13/2019 09:09  Ref. Range 10/02/2018 08:31  Vitamin D, 25-Hydroxy Latest Ref Range: 30.0 - 100.0 ng/mL 27.0 (L)   OBESITY BEHAVIORAL INTERVENTION VISIT  Today's visit was #13   Starting weight: 249 lbs Starting date: 10/02/2018 Today's weight: 222 lbs  Today's date: 05/08/2019 Total lbs lost to date: 27    05/08/2019  Height 5\' 3"  (1.6 m)  Weight 222 lb (100.7 kg)  BMI (Calculated) 39.34  BLOOD PRESSURE - SYSTOLIC 99991111  BLOOD PRESSURE - DIASTOLIC 62   Body Fat % [id number] %   Total Body Water (lbs)  79 lbs   ASK: We discussed the diagnosis of obesity with Tobin Chad today and Kinlyn agreed to give [place] permission to discuss obesity behavioral modification therapy today.  ASSESS: Adhara has the diagnosis of obesity and her BMI today is 39.3. Merisa is in the action stage of change.   ADVISE: Mayliah was educated on the multiple health risks of obesity as well as the benefit of weight loss to improve her health. She was advised of the need for long term treatment and the importance of lifestyle modifications to improve her current health and to decrease her risk of future health problems.  AGREE: Multiple dietary modification options and treatment options were discussed and  Manon agreed to follow the recommendations documented in the above note.  ARRANGE: Kerry was educated on the importance of frequent visits to treat obesity as outlined per CMS and USPSTF guidelines and agreed to schedule her next follow up appointment today.  I, Michaelene Song, am acting as Location manager for Dennard Nip, MD  I have reviewed the above documentation for accuracy and completeness, and I agree with the above. -Dennard Nip, MD

## 2019-05-26 ENCOUNTER — Other Ambulatory Visit: Payer: Self-pay

## 2019-05-26 ENCOUNTER — Ambulatory Visit (INDEPENDENT_AMBULATORY_CARE_PROVIDER_SITE_OTHER): Payer: 59 | Admitting: Family Medicine

## 2019-05-26 ENCOUNTER — Encounter (INDEPENDENT_AMBULATORY_CARE_PROVIDER_SITE_OTHER): Payer: Self-pay | Admitting: Family Medicine

## 2019-05-26 VITALS — BP 100/68 | HR 94 | Temp 98.0°F | Ht 63.0 in | Wt 219.0 lb

## 2019-05-26 DIAGNOSIS — E559 Vitamin D deficiency, unspecified: Secondary | ICD-10-CM

## 2019-05-26 DIAGNOSIS — Z9189 Other specified personal risk factors, not elsewhere classified: Secondary | ICD-10-CM | POA: Diagnosis not present

## 2019-05-26 DIAGNOSIS — R7303 Prediabetes: Secondary | ICD-10-CM | POA: Diagnosis not present

## 2019-05-26 DIAGNOSIS — Z6838 Body mass index (BMI) 38.0-38.9, adult: Secondary | ICD-10-CM | POA: Diagnosis not present

## 2019-05-26 MED ORDER — SAXENDA 18 MG/3ML ~~LOC~~ SOPN: 3.0000 mg | PEN_INJECTOR | Freq: Every day | SUBCUTANEOUS | 0 refills | Status: DC

## 2019-05-26 MED ORDER — VITAMIN D (ERGOCALCIFEROL) 1.25 MG (50000 UNIT) PO CAPS: 50000.0000 [IU] | ORAL_CAPSULE | ORAL | 0 refills | Status: DC

## 2019-05-26 NOTE — Progress Notes (Signed)
Office: [phone]  /  Fax: [phone]   HPI:   Chief Complaint: OBESITY Katherine Smith is here to discuss her progress with her obesity treatment plan. She is on the Category 3 plan and is following her eating plan approximately 90 % of the time. She states she is walking 30 minutes 3 to 4 times per week. Katherine Smith has done well with weight loss on her Category 3 plan. Her hunger is controlled on Saxenda and she is doing better with meal planning and prepping.  Her weight is 219 lb (99.3 kg) today and has had a weight loss of 3 pounds over a period of 2 weeks since her last visit. She has lost 30 lbs since starting treatment with [place].  Vitamin D Deficiency Katherine Smith has a diagnosis of vitamin D deficiency. She is currently stable on vit D. Katherine Smith denies nausea, vomiting, or muscle weakness.  Pre-Diabetes Katherine Smith has a diagnosis of pre-diabetes based on her elevated Hgb A1c and was informed this puts her at greater risk of developing diabetes. She is stable on Saxenda currently and continues to do well with her diet, exercise, and weight loss to decrease risk of diabetes. She denies nausea, vomiting, or hypoglycemia.   At risk for diabetes Katherine Smith is at higher than average risk for developing diabetes due to her pre-diabetes and obesity.   ASSESSMENT AND PLAN:  Vitamin D deficiency - Plan: Vitamin D, Ergocalciferol, (DRISDOL) 1.25 MG (50000 UT) CAPS capsule  Prediabetes  At risk for diabetes mellitus  Class 2 severe obesity with serious comorbidity and body mass index (BMI) of 38.0 to 38.9 in adult, unspecified obesity type ([place]) - Plan: Katherine -Weight Management (SAXENDA) 18 MG/3ML SOPN  PLAN:  Vitamin D Deficiency Katherine Smith was informed that low vitamin D levels contribute to fatigue and are associated with obesity, breast, and colon cancer. Katherine Smith agrees to continue to take prescription Vit D @50 ,000 IU every week #4 with no refills and will follow up for routine testing of vitamin D, at  least 2-3 times per year. She was informed of the risk of over-replacement of vitamin D and agrees to not increase her dose unless she discusses this with [place] first. Katherine Smith agrees to follow up in 2 to 3 weeks as directed.  Pre-Diabetes Katherine Smith will continue to work on weight loss, exercise, and decreasing simple carbohydrates in her diet to help decrease the risk of diabetes. She was informed that eating too many simple carbohydrates or too many calories at one sitting increases the likelihood of GI side effects. Katherine Smith agreed to continue Saxenda and a prescription was written today. Katherine Smith agreed to follow up with [place] as directed to monitor her progress in 2 to 3 weeks.   Diabetes risk counseling Katherine Smith was given extended (15 minutes) diabetes prevention counseling today. She is 58 y.o. female and has risk factors for diabetes including pre-diabetes and obesity. We discussed intensive lifestyle modifications today with an emphasis on weight loss as well as increasing exercise and decreasing simple carbohydrates in her diet.  Obesity Katherine Smith is currently in the action stage of change. As such, her goal is to continue with weight loss efforts. She has agreed to follow the Category 3 plan and keep a food journal of 400 to 600 calories and 40 grams of protein. Katherine Smith has been instructed to work up to a goal of 150 minutes of combined cardio and strengthening exercise per week for weight loss and overall health benefits. We discussed the following Behavioral Modification Strategies today:  increasing lean protein intake, decreasing simple carbohydrates, better snacking choices, and work on meal planning and easy cooking plans. We discussed various medication options to help Katherine Smith with her weight loss efforts and we both agreed to continue Saxenda 3 mg qd #5 pens with no refills.  Katherine Smith has agreed to follow up with our clinic in 2 to 3 weeks. She was informed of the importance of frequent follow up visits to  maximize her success with intensive lifestyle modifications for her multiple health conditions.  ALLERGIES: Allergies  Allergen Reactions  . Atorvastatin     Other reaction(s): Muscle Pain  . Dexfenfluramine     Other reaction(s): Unknown    MEDICATIONS: Current Outpatient Medications on File Prior to Visit  Medication Sig Dispense Refill  . Aspirin-Acetaminophen-Caffeine (EXCEDRIN MIGRAINE PO) Take 1 tablet by mouth daily as needed.    . Calcipotriene-Betameth Diprop (ENSTILAR) 0.005-0.064 % FOAM Apply topically.    . clobetasol (TEMOVATE) 0.05 % external solution Apply 1 application topically 2 (two) times daily.    Marland Kitchen ezetimibe (ZETIA) 10 MG tablet TAKE 1 TABLET BY MOUTH ONCE DAILY.    Marland Kitchen Insulin Pen Needle (BD PEN NEEDLE NANO 2ND GEN) 32G X 4 MM MISC 1 Package by Does not apply route 2 (two) times daily. 100 each 0  . levothyroxine (SYNTHROID) 150 MCG tablet Take 150 mcg by mouth daily before breakfast.    . lisinopril-hydrochlorothiazide (PRINZIDE,ZESTORETIC) 10-12.5 MG tablet Take 1 tablet by mouth daily.    . Multiple Vitamins-Minerals (HAIR SKIN & NAILS ADVANCED PO) Take 1 tablet by mouth daily.    . naproxen sodium (ANAPROX) 220 MG tablet Take 220 mg by mouth daily as needed.    . pantoprazole (PROTONIX) 40 MG tablet TAKE ONE TABLET BY MOUTH ONCE DAILY    . traZODone (DESYREL) 50 MG tablet Take 50 mg by mouth at bedtime as needed for sleep.     No current facility-administered medications on file prior to visit.     PAST MEDICAL HISTORY: Past Medical History:  Diagnosis Date  . Anxiety   . Back pain   . Cancer (HCC)    BASAL CELL SKIN CANCER  . Depression   . Fatty liver   . GERD (gastroesophageal reflux disease)   . Hashimoto's thyroiditis   . HTN (hypertension)   . Hyperlipidemia   . Hypothyroidism   . IBS (irritable bowel syndrome)   . Insomnia   . Joint pain   . Lactose intolerance   . Lichen sclerosus   . Obesity   . Psoriasis   . Sleep apnea     PAST  SURGICAL HISTORY: Past Surgical History:  Procedure Laterality Date  . ANTERIOR DISC ARTHROPLASTY     2004  . COLONOSCOPY  12/18/2011  . COLONOSCOPY WITH PROPOFOL N/A 04/04/2017   Procedure: COLONOSCOPY WITH PROPOFOL;  Surgeon: Manya Silvas, MD;  Location[facility] ENDOSCOPY;  Service: Endoscopy;  Laterality: N/A;    SOCIAL HISTORY: Social History   Tobacco Use  . Smoking status: Never Smoker  . Smokeless tobacco: Never Used  Substance Use Topics  . Alcohol use: No  . Drug use: No    FAMILY HISTORY: Family History  Problem Relation Age of Onset  . Hypertension Mother   . Hyperlipidemia Mother   . Hypertension Father   . Breast cancer Neg Hx     ROS: Review of Systems  Constitutional: Positive for weight loss.  Gastrointestinal: Negative for nausea and vomiting.  Musculoskeletal:  Negative for muscle weakness.  Endo/Heme/Allergies:       Negative for hypoglycemia.    PHYSICAL EXAM: Blood pressure 100/68, pulse 94, temperature 98 F (36.7 C), temperature source Oral, height 5\' 3"  (1.6 m), weight 219 lb (99.3 kg), last menstrual period 04/14/2016, SpO2 99 %. Body mass index is 38.79 kg/m. Physical Exam Vitals signs reviewed.  Constitutional:      Appearance: Normal appearance. She is obese.  Cardiovascular:     Rate and Rhythm: Normal rate.  Pulmonary:     Effort: Pulmonary effort is normal.  Musculoskeletal: Normal range of motion.  Skin:    General: Skin is warm and dry.  Neurological:     Mental Status: She is alert and oriented to person, place, and time.  Psychiatric:        Mood and Affect: Mood normal.        Behavior: Behavior normal.     RECENT LABS AND TESTS: BMET    Component Value Date/Time   NA 139 05/08/2019 1252   K 4.5 05/08/2019 1252   CL 102 05/08/2019 1252   CO2 23 05/08/2019 1252   GLUCOSE 103 (H) 05/08/2019 1252   GLUCOSE 110 (H) 01/07/2019 0930   BUN 21 05/08/2019 1252   CREATININE 0.91 05/08/2019 1252   CALCIUM 10.1  05/08/2019 1252   GFRNONAA 69 05/08/2019 1252   GFRAA 80 05/08/2019 1252   Lab Results  Component Value Date   HGBA1C 5.1 11/23/2016   HGBA1C 5.3 08/10/2016   Lab Results  Component Value Date   INSULIN 25.8 (H) 05/08/2019   INSULIN 46.2 (H) 10/02/2018   INSULIN 27.7 (H) 11/23/2016   INSULIN 38.9 (H) 08/10/2016   CBC    Component Value Date/Time   WBC 8.8 01/07/2019 0930   RBC 4.54 01/07/2019 0930   HGB 12.8 01/07/2019 0930   HGB 13.7 08/10/2016 1556   HCT 38.9 01/07/2019 0930   HCT 41.3 08/10/2016 1556   PLT 334 01/07/2019 0930   MCV 85.7 01/07/2019 0930   MCV 84 08/10/2016 1556   MCH 28.2 01/07/2019 0930   MCHC 32.9 01/07/2019 0930   RDW 13.9 01/07/2019 0930   RDW 14.0 08/10/2016 1556   LYMPHSABS 2.1 08/10/2016 1556   EOSABS 0.2 08/10/2016 1556   BASOSABS 0.1 08/10/2016 1556   Iron/TIBC/Ferritin/ %Sat No results found for: IRON, TIBC, FERRITIN, IRONPCTSAT Lipid Panel     Component Value Date/Time   CHOL 187 11/23/2016 0832   TRIG 101 11/23/2016 0832   HDL 66 11/23/2016 0832   LDLCALC 101 (H) 11/23/2016 0832   Hepatic Function Panel     Component Value Date/Time   PROT 7.2 05/08/2019 1252   ALBUMIN 4.5 05/08/2019 1252   AST 20 05/08/2019 1252   ALT 22 05/08/2019 1252   ALKPHOS 80 05/08/2019 1252   BILITOT 0.5 05/08/2019 1252      Component Value Date/Time   TSH 0.011 (L) 11/23/2016 0832   TSH 1.170 08/10/2016 1556   Results for KEESHIA, BELARDE (MRN [id number]) as of 05/26/2019 14:36  Ref. Range 10/02/2018 08:31  Vitamin D, 25-Hydroxy Latest Ref Range: 30.0 - 100.0 ng/mL 27.0 (L)    OBESITY BEHAVIORAL INTERVENTION VISIT  Today's visit was # 14   Starting weight: 249 lbs Starting date: 10/02/18 Today's weight : Weight: 219 lb (99.3 kg)  Today's date: 05/26/2019 Total lbs lost to date: 30    05/26/2019  Height 5\' 3"  (1.6 m)  Weight 219 lb (99.3 kg)  BMI (Calculated) 38.8  BLOOD PRESSURE - SYSTOLIC [id number]  BLOOD PRESSURE - DIASTOLIC 68   Body  Fat % 43.3 %  Total Body Water (lbs) 78.2 lbs    ASK: We discussed the diagnosis of obesity with Tobin Chad today and Magally agreed to give [place] permission to discuss obesity behavioral modification therapy today.  ASSESS: Shamarie has the diagnosis of obesity and her BMI today is 38.8. Rachelmarie is in the action stage of change.   ADVISE: Shaynah was educated on the multiple health risks of obesity as well as the benefit of weight loss to improve her health. She was advised of the need for long term treatment and the importance of lifestyle modifications to improve her current health and to decrease her risk of future health problems.  AGREE: Multiple dietary modification options and treatment options were discussed and Tziry agreed to follow the recommendations documented in the above note.  ARRANGE: Izela was educated on the importance of frequent visits to treat obesity as outlined per CMS and USPSTF guidelines and agreed to schedule her next follow up appointment today.  IMarcille Blanco, CMA, am acting as transcriptionist for Starlyn Skeans, MD I have reviewed the above documentation for accuracy and completeness, and I agree with the above. -Dennard Nip, MD

## 2019-06-19 ENCOUNTER — Other Ambulatory Visit: Payer: Self-pay

## 2019-06-19 ENCOUNTER — Encounter (INDEPENDENT_AMBULATORY_CARE_PROVIDER_SITE_OTHER): Payer: Self-pay | Admitting: Family Medicine

## 2019-06-19 ENCOUNTER — Ambulatory Visit (INDEPENDENT_AMBULATORY_CARE_PROVIDER_SITE_OTHER): Payer: 59 | Admitting: Family Medicine

## 2019-06-19 VITALS — BP 113/69 | HR 98 | Temp 98.2°F | Ht 63.0 in | Wt 219.0 lb

## 2019-06-19 DIAGNOSIS — Z6838 Body mass index (BMI) 38.0-38.9, adult: Secondary | ICD-10-CM | POA: Diagnosis not present

## 2019-06-19 DIAGNOSIS — R7303 Prediabetes: Secondary | ICD-10-CM | POA: Diagnosis not present

## 2019-06-19 DIAGNOSIS — Z9189 Other specified personal risk factors, not elsewhere classified: Secondary | ICD-10-CM | POA: Diagnosis not present

## 2019-06-19 DIAGNOSIS — E559 Vitamin D deficiency, unspecified: Secondary | ICD-10-CM | POA: Diagnosis not present

## 2019-06-19 MED ORDER — SAXENDA 18 MG/3ML ~~LOC~~ SOPN: 3.0000 mg | PEN_INJECTOR | Freq: Every day | SUBCUTANEOUS | 0 refills | Status: DC

## 2019-06-19 MED ORDER — VITAMIN D (ERGOCALCIFEROL) 1.25 MG (50000 UNIT) PO CAPS: 50000.0000 [IU] | ORAL_CAPSULE | ORAL | 0 refills | Status: DC

## 2019-06-19 NOTE — Progress Notes (Signed)
Office: [phone]  /  Fax: [phone]   HPI:   Chief Complaint: OBESITY Katherine Smith is here to discuss her progress with her obesity treatment plan. She is on the Category 3 plan and journaling and is following her eating plan approximately 50% of the time. She states she is trying to be mindful of her steps. Katherine Smith has done well with maintaining her weight. She has been on vacation and has done some celebration eating, but has still been mindful. She is stable on Saxenda. Her weight is 219 lb (99.3 kg) today and has not lost weight since her last visit. She has lost 21 lbs since starting treatment with [place].  Vitamin D deficiency Katherine Smith has a diagnosis of Vitamin D deficiency,m which is not yet at goal. She is currently stable on prescription Vit D and denies nausea, vomiting or muscle weakness.  Pre-Diabetes Katherine Smith has a diagnosis of prediabetes based on her elevated Hgb A1c and was informed this puts her at greater risk of developing diabetes. She is stable with diet and Liraglutide and continues to work on diet and exercise to decrease risk of diabetes. She denies nausea or hypoglycemia. She reports decreased polyphagia.  At risk for diabetes Katherine Smith is at higher than average risk for developing diabetes due to her obesity. She currently denies polyuria or polydipsia.  ASSESSMENT AND PLAN:  Vitamin D deficiency - Plan: Vitamin D, Ergocalciferol, (DRISDOL) 1.25 MG (50000 UT) CAPS capsule  Prediabetes  At risk for diabetes mellitus  Class 2 severe obesity with serious comorbidity and body mass index (BMI) of 38.0 to 38.9 in adult, unspecified obesity type ([place]) - Plan: Liraglutide -Weight Management (SAXENDA) 18 MG/3ML SOPN  PLAN:  Vitamin D Deficiency Katherine Smith was informed that low Vitamin D levels contributes to fatigue and are associated with obesity, breast, and colon cancer. She agrees to continue to take prescription Vit D @ 50,000 IU every week #4 with 0 refills and will  follow-up for routine testing of Vitamin D, at least 2-3 times per year. She was informed of the risk of over-replacement of Vitamin D and agrees to not increase her dose unless she discusses this with [place] first. Katherine Smith agrees to follow-up with our clinic in 4 weeks.  Pre-Diabetes Katherine Smith will continue to work on weight loss, exercise, and decreasing simple carbohydrates in her diet to help decrease the risk of diabetes. We dicussed metformin including benefits and risks. She was informed that eating too many simple carbohydrates or too many calories at one sitting increases the likelihood of GI side effects. Katherine Smith will continue diet, exercise, and medications. She will have labs checked in 6 months.  Diabetes risk counseling Katherine Smith was given extended (15 minutes) diabetes prevention counseling today. She is 58 y.o. female and has risk factors for diabetes including obesity. We discussed intensive lifestyle modifications today with an emphasis on weight loss as well as increasing exercise and decreasing simple carbohydrates in her diet.  Obesity Katherine Smith is currently in the action stage of change. As such, her goal is to continue with weight loss efforts. She has agreed to follow the Category 3 plan and journal 400-600 calories and 40+ grams of protein at supper. Katherine Smith has been instructed to work up to a goal of 150 minutes of combined cardio and strengthening exercise per week for weight loss and overall health benefits. We discussed the following Behavioral Modification Strategies today: increasing lean protein intake and holiday eating strategies.  Katherine Smith was given a refill on her Saxenda 3  mg #5 pens with 0 refills and agrees to follow-up with our clinic in 4 weeks.  Katherine Smith has agreed to follow-up with our clinic in 4 weeks. She was informed of the importance of frequent follow-up visits to maximize her success with intensive lifestyle modifications for her multiple health conditions.   ALLERGIES: Allergies  Allergen Reactions  . Atorvastatin     Other reaction(s): Muscle Pain  . Dexfenfluramine     Other reaction(s): Unknown    MEDICATIONS: Current Outpatient Medications on File Prior to Visit  Medication Sig Dispense Refill  . Aspirin-Acetaminophen-Caffeine (EXCEDRIN MIGRAINE PO) Take 1 tablet by mouth daily as needed.    . Calcipotriene-Betameth Diprop (ENSTILAR) 0.005-0.064 % FOAM Apply topically.    . clobetasol (TEMOVATE) 0.05 % external solution Apply 1 application topically 2 (two) times daily.    Katherine Smith Kitchen ezetimibe (ZETIA) 10 MG tablet TAKE 1 TABLET BY MOUTH ONCE DAILY.    Katherine Smith Kitchen Insulin Pen Needle (BD PEN NEEDLE NANO 2ND GEN) 32G X 4 MM MISC 1 Package by Does not apply route 2 (two) times daily. 100 each 0  . levothyroxine (SYNTHROID) 150 MCG tablet Take 150 mcg by mouth daily before breakfast.    . lisinopril-hydrochlorothiazide (PRINZIDE,ZESTORETIC) 10-12.5 MG tablet Take 1 tablet by mouth daily.    . Multiple Vitamins-Minerals (HAIR SKIN & NAILS ADVANCED PO) Take 1 tablet by mouth daily.    . naproxen sodium (ANAPROX) 220 MG tablet Take 220 mg by mouth daily as needed.    . pantoprazole (PROTONIX) 40 MG tablet TAKE ONE TABLET BY MOUTH ONCE DAILY    . traZODone (DESYREL) 50 MG tablet Take 50 mg by mouth at bedtime as needed for sleep.     No current facility-administered medications on file prior to visit.     PAST MEDICAL HISTORY: Past Medical History:  Diagnosis Date  . Anxiety   . Back pain   . Cancer (HCC)    BASAL CELL SKIN CANCER  . Depression   . Fatty liver   . GERD (gastroesophageal reflux disease)   . Hashimoto's thyroiditis   . HTN (hypertension)   . Hyperlipidemia   . Hypothyroidism   . IBS (irritable bowel syndrome)   . Insomnia   . Joint pain   . Lactose intolerance   . Lichen sclerosus   . Obesity   . Psoriasis   . Sleep apnea     PAST SURGICAL HISTORY: Past Surgical History:  Procedure Laterality Date  . ANTERIOR DISC  ARTHROPLASTY     2004  . COLONOSCOPY  12/18/2011  . COLONOSCOPY WITH PROPOFOL N/A 04/04/2017   Procedure: COLONOSCOPY WITH PROPOFOL;  Surgeon: Manya Silvas, MD;  Location: [facility] ENDOSCOPY;  Service: Endoscopy;  Laterality: N/A;    SOCIAL HISTORY: Social History   Tobacco Use  . Smoking status: Never Smoker  . Smokeless tobacco: Never Used  Substance Use Topics  . Alcohol use: No  . Drug use: No    FAMILY HISTORY: Family History  Problem Relation Age of Onset  . Hypertension Mother   . Hyperlipidemia Mother   . Hypertension Father   . Breast cancer Neg Hx    ROS: Review of Systems  Gastrointestinal: Negative for nausea and vomiting.  Musculoskeletal:       Negative for muscle weakness.  Endo/Heme/Allergies:       Negative for hypoglycemia. Positive for decreased polyphagia.   PHYSICAL EXAM: Blood pressure 113/69, pulse 98, temperature 98.2 F (36.8 C), temperature source Oral, height 5\' 3"  (1.6  m), weight 219 lb (99.3 kg), last menstrual period 04/14/2016, SpO2 99 %. Body mass index is 38.79 kg/m. Physical Exam Vitals signs reviewed.  Constitutional:      Appearance: Normal appearance. She is obese.  Cardiovascular:     Rate and Rhythm: Normal rate.     Pulses: Normal pulses.  Pulmonary:     Effort: Pulmonary effort is normal.     Breath sounds: Normal breath sounds.  Musculoskeletal: Normal range of motion.  Skin:    General: Skin is warm and dry.  Neurological:     Mental Status: She is alert and oriented to person, place, and time.  Psychiatric:        Behavior: Behavior normal.   RECENT LABS AND TESTS: BMET    Component Value Date/Time   NA 139 05/08/2019 1252   K 4.5 05/08/2019 1252   CL 102 05/08/2019 1252   CO2 23 05/08/2019 1252   GLUCOSE 103 (H) 05/08/2019 1252   GLUCOSE 110 (H) 01/07/2019 0930   BUN 21 05/08/2019 1252   CREATININE 0.91 05/08/2019 1252   CALCIUM 10.1 05/08/2019 1252   GFRNONAA 69 05/08/2019 1252   GFRAA 80 05/08/2019  1252   Lab Results  Component Value Date   HGBA1C 5.1 11/23/2016   HGBA1C 5.3 08/10/2016   Lab Results  Component Value Date   INSULIN 25.8 (H) 05/08/2019   INSULIN 46.2 (H) 10/02/2018   INSULIN 27.7 (H) 11/23/2016   INSULIN 38.9 (H) 08/10/2016   CBC    Component Value Date/Time   WBC 8.8 01/07/2019 0930   RBC 4.54 01/07/2019 0930   HGB 12.8 01/07/2019 0930   HGB 13.7 08/10/2016 1556   HCT 38.9 01/07/2019 0930   HCT 41.3 08/10/2016 1556   PLT 334 01/07/2019 0930   MCV 85.7 01/07/2019 0930   MCV 84 08/10/2016 1556   MCH 28.2 01/07/2019 0930   MCHC 32.9 01/07/2019 0930   RDW 13.9 01/07/2019 0930   RDW 14.0 08/10/2016 1556   LYMPHSABS 2.1 08/10/2016 1556   EOSABS 0.2 08/10/2016 1556   BASOSABS 0.1 08/10/2016 1556   Iron/TIBC/Ferritin/ %Sat No results found for: IRON, TIBC, FERRITIN, IRONPCTSAT Lipid Panel     Component Value Date/Time   CHOL 187 11/23/2016 0832   TRIG 101 11/23/2016 0832   HDL 66 11/23/2016 0832   LDLCALC 101 (H) 11/23/2016 0832   Hepatic Function Panel     Component Value Date/Time   PROT 7.2 05/08/2019 1252   ALBUMIN 4.5 05/08/2019 1252   AST 20 05/08/2019 1252   ALT 22 05/08/2019 1252   ALKPHOS 80 05/08/2019 1252   BILITOT 0.5 05/08/2019 1252      Component Value Date/Time   TSH 0.011 (L) 11/23/2016 0832   TSH 1.170 08/10/2016 1556   Results for NADYIA, HANNEKEN (MRN [id number]) as of 06/19/2019 10:03  Ref. Range 10/02/2018 08:31  Vitamin D, 25-Hydroxy Latest Ref Range: 30.0 - 100.0 ng/mL 27.0 (L)   OBESITY BEHAVIORAL INTERVENTION VISIT  Today's visit was #27  Starting weight: 240 lbs Starting date: 08/10/2016 Today's weight: 219 lbs  Today's date: 06/19/2019 Total lbs lost to date: 21    06/19/2019  Height 5\' 3"  (1.6 m)  Weight 219 lb (99.3 kg)  BMI (Calculated) 38.8  BLOOD PRESSURE - SYSTOLIC [id number]  BLOOD PRESSURE - DIASTOLIC 69   Body Fat % [id number] %  Total Body Water (lbs) 78.2 lbs   ASK: We discussed the diagnosis of  obesity with Katherine Smith today and  Katherine Smith agreed to give [place] permission to discuss obesity behavioral modification therapy today.  ASSESS: Katherine Smith has the diagnosis of obesity and her BMI today is 38.9. Katherine Smith is in the action stage of change.   ADVISE: Kinna was educated on the multiple health risks of obesity as well as the benefit of weight loss to improve her health. She was advised of the need for long term treatment and the importance of lifestyle modifications to improve her current health and to decrease her risk of future health problems.  AGREE: Multiple dietary modification options and treatment options were discussed and  Katherine Smith agreed to follow the recommendations documented in the above note.  ARRANGE: Katherine Smith was educated on the importance of frequent visits to treat obesity as outlined per CMS and USPSTF guidelines and agreed to schedule her next follow up appointment today.  I, Michaelene Song, am acting as Location manager for Dennard Nip, MD   I have reviewed the above documentation for accuracy and completeness, and I agree with the above. -Dennard Nip, MD

## 2019-07-17 ENCOUNTER — Ambulatory Visit (INDEPENDENT_AMBULATORY_CARE_PROVIDER_SITE_OTHER): Payer: 59 | Admitting: Family Medicine

## 2019-07-17 ENCOUNTER — Other Ambulatory Visit: Payer: Self-pay

## 2019-07-17 ENCOUNTER — Encounter (INDEPENDENT_AMBULATORY_CARE_PROVIDER_SITE_OTHER): Payer: Self-pay | Admitting: Family Medicine

## 2019-07-17 VITALS — BP 112/73 | HR 96 | Temp 98.0°F | Ht 63.0 in | Wt 219.0 lb

## 2019-07-17 DIAGNOSIS — Z6838 Body mass index (BMI) 38.0-38.9, adult: Secondary | ICD-10-CM

## 2019-07-17 DIAGNOSIS — F418 Other specified anxiety disorders: Secondary | ICD-10-CM

## 2019-07-17 DIAGNOSIS — Z9189 Other specified personal risk factors, not elsewhere classified: Secondary | ICD-10-CM | POA: Diagnosis not present

## 2019-07-17 DIAGNOSIS — E559 Vitamin D deficiency, unspecified: Secondary | ICD-10-CM | POA: Diagnosis not present

## 2019-07-17 MED ORDER — ESCITALOPRAM OXALATE 10 MG PO TABS: 10.0000 mg | ORAL_TABLET | ORAL | 0 refills | Status: DC

## 2019-07-17 MED ORDER — BD PEN NEEDLE NANO 2ND GEN 32G X 4 MM MISC: 1.0000 | Freq: Two times a day (BID) | 0 refills | Status: DC

## 2019-07-17 MED ORDER — VITAMIN D (ERGOCALCIFEROL) 1.25 MG (50000 UNIT) PO CAPS: 50000.0000 [IU] | ORAL_CAPSULE | ORAL | 0 refills | Status: DC

## 2019-07-17 MED ORDER — SAXENDA 18 MG/3ML ~~LOC~~ SOPN: 3.0000 mg | PEN_INJECTOR | Freq: Every day | SUBCUTANEOUS | 0 refills | Status: DC

## 2019-07-17 NOTE — Progress Notes (Signed)
Office: [phone]  /  Fax: [phone]   HPI:   Chief Complaint: OBESITY Katherine Smith is here to discuss her progress with her obesity treatment plan. She is on the Category 3 plan and journaling supper and is following her eating plan approximately 75% of the time. She states she is exercising 0 minutes 0 times per week. Katherine Smith has done well maintaining her weight even over Thanksgiving. Hunger is controlled on Saxenda, but she continues to struggle with emotional eating.  Her weight is 219 lb (99.3 kg) today and has not lost weight since her last visit. She has lost 21 lbs since starting treatment with [place].  Depression with Anxiety  Katherine Smith is struggling with emotional eating and using food for comfort to the extent that it is negatively impacting her health. She often snacks when she is not hungry. Katherine Smith sometimes feels she is out of control and then feels guilty that she made poor food choices. She has been working on behavior modification techniques to help reduce her emotional eating and has been somewhat successful. Katherine Smith notes continuing anxiety with family stressors and COVID-19 stress and is struggling to accept things she cannot control. This is affecting her eating with increased emotional eating. She shows no sign of suicidal or homicidal ideations.  Depression screen [facility] 2/9 10/02/2018 08/10/2016  Decreased Interest 2 2  Down, Depressed, Hopeless 1 2  PHQ - 2 Score 3 4  Altered sleeping 3 3  Tired, decreased energy 2 3  Change in appetite 2 2  Feeling bad or failure about yourself  1 1  Trouble concentrating 0 0  Moving slowly or fidgety/restless 0 1  Suicidal thoughts 0 0  PHQ-9 Score 11 14  Difficult doing work/chores Not difficult at all -   Vitamin D deficiency Katherine Smith has a diagnosis of Vitamin D deficiency. She is currently stable on prescription Vit D and denies nausea, vomiting or muscle weakness. She is due for labs soon.  At risk for cardiovascular disease  Katherine Smith is at a higher than average risk for cardiovascular disease due to obesity. She currently denies any chest pain.  ASSESSMENT AND PLAN:  Vitamin D deficiency - Plan: Vitamin D, Ergocalciferol, (DRISDOL) 1.25 MG (50000 UT) CAPS capsule  Depression with anxiety - Plan: escitalopram (Smith) 10 MG tablet  At risk for heart disease  Class 2 severe obesity with serious comorbidity and body mass index (BMI) of 38.0 to 38.9 in adult, unspecified obesity type ([place]) - Plan: Liraglutide -Weight Management (SAXENDA) 18 MG/3ML SOPN, Insulin Pen Needle (BD PEN NEEDLE NANO 2ND GEN) 32G X 4 MM MISC  PLAN:  Emotional Eating Behaviors (other depression) We discussed behavior modification techniques today to help Katherine Smith deal with her emotional eating behaviors. Katherine Smith 10 mg QD #30 and will follow-up with our clinic in 3 weeks.  Vitamin D Deficiency Katherine Smith was informed that low Vitamin D levels contributes to fatigue and are associated with obesity, breast, and colon cancer. She agrees to continue to take prescription Vit D @ 50,000 IU every week #4 with 0 refills and will follow-up for routine testing of Vitamin D at her next visit. She was informed of the risk of over-replacement of Vitamin D and agrees to not increase her dose unless she discusses this with [place] first. Katherine Smith agrees to follow-up with our clinic in 3 weeks.  Cardiovascular risk counseling Katherine Smith was given extended (15 minutes) coronary artery disease prevention counseling today. She is 58 y.o. female and has risk  factors for heart disease including obesity. We discussed intensive lifestyle modifications today with an emphasis on specific weight loss instructions and strategies. Pt was also informed of the importance of increasing exercise and decreasing saturated fats to help prevent heart disease.  Obesity Katherine Smith is currently in the action stage of change. As such, her goal is to continue with weight loss efforts.  She has agreed to follow the Category 3 plan and will journal 400-600 calories and 40+ grams of protein at supper. Katherine Smith has been instructed to work up to a goal of 150 minutes of combined cardio and strengthening exercise per week for weight loss and overall health benefits. We discussed the following Behavioral Modification Strategies today: increasing lean protein intake.  Katherine Smith was given a refill on her Saxenda 3.0 #5 plus nanoneedles and continue at 2.4. She agrees to follow-up with our clinic in 3 weeks.  Katherine Smith has agreed to follow-up with our clinic in 3 weeks. She was informed of the importance of frequent follow-up visits to maximize her success with intensive lifestyle modifications for her multiple health conditions.  ALLERGIES: Allergies  Allergen Reactions  . Atorvastatin     Other reaction(s): Muscle Pain  . Dexfenfluramine     Other reaction(s): Unknown    MEDICATIONS: Current Outpatient Medications on File Prior to Visit  Medication Sig Dispense Refill  . Aspirin-Acetaminophen-Caffeine (EXCEDRIN MIGRAINE PO) Take 1 tablet by mouth daily as needed.    . Calcipotriene-Betameth Diprop (ENSTILAR) 0.005-0.064 % FOAM Apply topically.    . clobetasol (TEMOVATE) 0.05 % external solution Apply 1 application topically 2 (two) times daily.    Marland Kitchen ezetimibe (ZETIA) 10 MG tablet TAKE 1 TABLET BY MOUTH ONCE DAILY.    Marland Kitchen levothyroxine (SYNTHROID) 150 MCG tablet Take 150 mcg by mouth daily before breakfast.    . lisinopril-hydrochlorothiazide (PRINZIDE,ZESTORETIC) 10-12.5 MG tablet Take 1 tablet by mouth daily.    . Multiple Vitamins-Minerals (HAIR SKIN & NAILS ADVANCED PO) Take 1 tablet by mouth daily.    . naproxen sodium (ANAPROX) 220 MG tablet Take 220 mg by mouth daily as needed.    . pantoprazole (PROTONIX) 40 MG tablet TAKE ONE TABLET BY MOUTH ONCE DAILY    . traZODone (DESYREL) 50 MG tablet Take 50 mg by mouth at bedtime as needed for sleep.     No current  facility-administered medications on file prior to visit.     PAST MEDICAL HISTORY: Past Medical History:  Diagnosis Date  . Anxiety   . Back pain   . Cancer (HCC)    BASAL CELL SKIN CANCER  . Depression   . Fatty liver   . GERD (gastroesophageal reflux disease)   . Hashimoto's thyroiditis   . HTN (hypertension)   . Hyperlipidemia   . Hypothyroidism   . IBS (irritable bowel syndrome)   . Insomnia   . Joint pain   . Lactose intolerance   . Lichen sclerosus   . Obesity   . Psoriasis   . Sleep apnea     PAST SURGICAL HISTORY: Past Surgical History:  Procedure Laterality Date  . ANTERIOR DISC ARTHROPLASTY     2004  . COLONOSCOPY  12/18/2011  . COLONOSCOPY WITH PROPOFOL N/A 04/04/2017   Procedure: COLONOSCOPY WITH PROPOFOL;  Surgeon: Manya Silvas, MD;  Location: [facility] ENDOSCOPY;  Service: Endoscopy;  Laterality: N/A;    SOCIAL HISTORY: Social History   Tobacco Use  . Smoking status: Never Smoker  . Smokeless tobacco: Never Used  Substance Use Topics  .  Alcohol use: No  . Drug use: No    FAMILY HISTORY: Family History  Problem Relation Age of Onset  . Hypertension Mother   . Hyperlipidemia Mother   . Hypertension Father   . Breast cancer Neg Hx    ROS: Review of Systems  Gastrointestinal: Negative for nausea and vomiting.  Musculoskeletal:       Negative for muscle weakness.  Psychiatric/Behavioral: Positive for depression. Negative for suicidal ideas. The patient is nervous/anxious.        Negative for homicidal ideas.   PHYSICAL EXAM: Blood pressure 112/73, pulse 96, temperature 98 F (36.7 C), temperature source Oral, height 5\' 3"  (1.6 m), weight 219 lb (99.3 kg), last menstrual period 04/14/2016, SpO2 98 %. Body mass index is 38.79 kg/m. Physical Exam Vitals signs reviewed.  Constitutional:      Appearance: Normal appearance. She is obese.  Cardiovascular:     Rate and Rhythm: Normal rate.     Pulses: Normal pulses.  Pulmonary:     Effort:  Pulmonary effort is normal.     Breath sounds: Normal breath sounds.  Musculoskeletal: Normal range of motion.  Skin:    General: Skin is warm and dry.  Neurological:     Mental Status: She is alert and oriented to person, place, and time.  Psychiatric:        Behavior: Behavior normal.   RECENT LABS AND TESTS: BMET    Component Value Date/Time   NA 139 05/08/2019 1252   K 4.5 05/08/2019 1252   CL 102 05/08/2019 1252   CO2 23 05/08/2019 1252   GLUCOSE 103 (H) 05/08/2019 1252   GLUCOSE 110 (H) 01/07/2019 0930   BUN 21 05/08/2019 1252   CREATININE 0.91 05/08/2019 1252   CALCIUM 10.1 05/08/2019 1252   GFRNONAA 69 05/08/2019 1252   GFRAA 80 05/08/2019 1252   Lab Results  Component Value Date   HGBA1C 5.1 11/23/2016   HGBA1C 5.3 08/10/2016   Lab Results  Component Value Date   INSULIN 25.8 (H) 05/08/2019   INSULIN 46.2 (H) 10/02/2018   INSULIN 27.7 (H) 11/23/2016   INSULIN 38.9 (H) 08/10/2016   CBC    Component Value Date/Time   WBC 8.8 01/07/2019 0930   RBC 4.54 01/07/2019 0930   HGB 12.8 01/07/2019 0930   HGB 13.7 08/10/2016 1556   HCT 38.9 01/07/2019 0930   HCT 41.3 08/10/2016 1556   PLT 334 01/07/2019 0930   MCV 85.7 01/07/2019 0930   MCV 84 08/10/2016 1556   MCH 28.2 01/07/2019 0930   MCHC 32.9 01/07/2019 0930   RDW 13.9 01/07/2019 0930   RDW 14.0 08/10/2016 1556   LYMPHSABS 2.1 08/10/2016 1556   EOSABS 0.2 08/10/2016 1556   BASOSABS 0.1 08/10/2016 1556   Iron/TIBC/Ferritin/ %Sat No results found for: IRON, TIBC, FERRITIN, IRONPCTSAT Lipid Panel     Component Value Date/Time   CHOL 187 11/23/2016 0832   TRIG 101 11/23/2016 0832   HDL 66 11/23/2016 0832   LDLCALC 101 (H) 11/23/2016 0832   Hepatic Function Panel     Component Value Date/Time   PROT 7.2 05/08/2019 1252   ALBUMIN 4.5 05/08/2019 1252   AST 20 05/08/2019 1252   ALT 22 05/08/2019 1252   ALKPHOS 80 05/08/2019 1252   BILITOT 0.5 05/08/2019 1252      Component Value Date/Time    TSH 0.011 (L) 11/23/2016 0832   TSH 1.170 08/10/2016 1556   Results for NAVAE, FRANCZAK (MRN [id number]) as of 07/17/2019  10:28  Ref. Range 10/02/2018 08:31  Vitamin D, 25-Hydroxy Latest Ref Range: 30.0 - 100.0 ng/mL 27.0 (L)   OBESITY BEHAVIORAL INTERVENTION VISIT  Today's visit was #28   Starting weight: 240 lbs Starting date: 08/10/2016 Today's weight: 219 lbs  Today's date: 07/17/2019 Total lbs lost to date: 21     07/17/2019  Height 5\' 3"  (1.6 m)  Weight 219 lb (99.3 kg)  BMI (Calculated) 38.8  BLOOD PRESSURE - SYSTOLIC [id number]  BLOOD PRESSURE - DIASTOLIC 73   Body Fat % [id number] %  Total Body Water (lbs) 78 lbs   ASK: We discussed the diagnosis of obesity with Tobin Chad today and Trinh agreed to give [place] permission to discuss obesity behavioral modification therapy today.  ASSESS: Elliana has the diagnosis of obesity and her BMI today is 38.9. Myndi is in the action stage of change.   ADVISE: Livy was educated on the multiple health risks of obesity as well as the benefit of weight loss to improve her health. She was advised of the need for long term treatment and the importance of lifestyle modifications to improve her current health and to decrease her risk of future health problems.  AGREE: Multiple dietary modification options and treatment options were discussed and  Jaleeza agreed to follow the recommendations documented in the above note.  ARRANGE: Delfa was educated on the importance of frequent visits to treat obesity as outlined per CMS and USPSTF guidelines and agreed to schedule her next follow up appointment today.  I, Michaelene Song, am acting as Location manager for Dennard Nip, MD  I have reviewed the above documentation for accuracy and completeness, and I agree with the above. -Dennard Nip, MD

## 2019-08-05 ENCOUNTER — Encounter (INDEPENDENT_AMBULATORY_CARE_PROVIDER_SITE_OTHER): Payer: Self-pay | Admitting: Family Medicine

## 2019-08-05 ENCOUNTER — Other Ambulatory Visit: Payer: Self-pay

## 2019-08-05 ENCOUNTER — Ambulatory Visit (INDEPENDENT_AMBULATORY_CARE_PROVIDER_SITE_OTHER): Payer: 59 | Admitting: Family Medicine

## 2019-08-05 VITALS — BP 115/77 | HR 79 | Temp 97.9°F | Ht 63.0 in | Wt 216.0 lb

## 2019-08-05 DIAGNOSIS — R7303 Prediabetes: Secondary | ICD-10-CM

## 2019-08-05 DIAGNOSIS — Z6838 Body mass index (BMI) 38.0-38.9, adult: Secondary | ICD-10-CM | POA: Diagnosis not present

## 2019-08-05 DIAGNOSIS — Z9189 Other specified personal risk factors, not elsewhere classified: Secondary | ICD-10-CM | POA: Diagnosis not present

## 2019-08-05 DIAGNOSIS — E559 Vitamin D deficiency, unspecified: Secondary | ICD-10-CM

## 2019-08-05 DIAGNOSIS — F3289 Other specified depressive episodes: Secondary | ICD-10-CM

## 2019-08-05 MED ORDER — SAXENDA 18 MG/3ML ~~LOC~~ SOPN: 3.0000 mg | PEN_INJECTOR | Freq: Every day | SUBCUTANEOUS | 0 refills | Status: DC

## 2019-08-05 MED ORDER — VITAMIN D (ERGOCALCIFEROL) 1.25 MG (50000 UNIT) PO CAPS: 50000.0000 [IU] | ORAL_CAPSULE | ORAL | 0 refills | Status: DC

## 2019-08-05 MED ORDER — ESCITALOPRAM OXALATE 10 MG PO TABS: 10.0000 mg | ORAL_TABLET | ORAL | 0 refills | Status: DC

## 2019-08-05 NOTE — Progress Notes (Signed)
Office: [phone]  /  Fax: [phone]   HPI:  Chief Complaint: OBESITY Katherine Smith is here to discuss her progress with her obesity treatment plan. She is on the Category 3 plan for breakfast and lunch and journaling supper and states she is following her eating plan approximately 85% of the time. She states she is exercising 0 minutes 0 times per week.  Katherine Smith has done well with weight loss. She feels she is able to avoid temptations better and hunger is mostly controlled.  Today's visit was #29 Starting weight: 240 lbs Starting date: 08/10/2016 Today's weight: 216 lbs  Today's date: 08/05/2019 Total lbs lost to date: 24 Total lbs lost since last in-office visit: 3   Vitamin D deficiency Katherine Smith has a diagnosis of Vitamin D deficiency and is stable on prescription Vitamin D. She is due to have her labs rechecked.  Prediabetes Katherine Smith has a diagnosis of prediabetes and is doing well with diet and medications. She is due for labs. No nausea, vomiting, or hypoglycemia.  At risk for diabetes Katherine Smith is at higher than average risk for developing diabetes due to her obesity.   Emotional Eating Katherine Smith feels she is doing well on Lexapro with decreased emotional eating and feels more in control of snacking. She notes she didn't cry when a co-worker suddenly died and is concerned about this, but not enough to discontinue her medications.  ASSESSMENT AND PLAN:  Vitamin D deficiency - Plan: Vitamin D (25 hydroxy), Vitamin D, Ergocalciferol, (DRISDOL) 1.25 MG (50000 UT) CAPS capsule  Prediabetes - Plan: HgB A1c, Insulin, random, Comprehensive Metabolic Panel (CMET)  Other depression, with emotional eating - Plan: escitalopram (LEXAPRO) 10 MG tablet  At risk for diabetes mellitus  Class 2 severe obesity with serious comorbidity and body mass index (BMI) of 38.0 to 38.9 in adult, unspecified obesity type ([place]) - Plan: Liraglutide -Weight Management (SAXENDA) 18 MG/3ML  SOPN  PLAN:  Vitamin D Deficiency Katherine Smith was informed that low Vitamin D levels contributes to fatigue and are associated with obesity, breast, and colon cancer. She agrees to continue to take prescription Vit D @ 50,000 IU every week #4 with 0 refills and will have routine testing of Vitamin D, at least 2-3 times per year. She was informed of the risk of over-replacement of Vitamin D and agrees to not increase her dose unless she discusses this with [place] first. Katherine Smith agrees to follow-up with our clinic in 3-4 weeks.  Pre-Diabetes Katherine Smith will continue to work on weight loss, exercise, and decreasing simple carbohydrates to help decrease the risk of diabetes. Katherine Smith will have labs checked. She will continue liraglutide at 2.4 mg and follow-up as directed.  Diabetes risk counseling (~15 min) Katherine Smith is a 58 y.o. female and has risk factors for diabetes including obesity. We discussed intensive lifestyle modifications today with an emphasis on weight loss as well as increasing exercise and decreasing simple carbohydrates in her diet.  Emotional Eating Katherine Smith was given a refill on her Lexapro x1 month supply. She will continue to monitor if benefits outweigh side effects of the medication.  Obesity Katherine Smith is currently in the action stage of change. As such, her goal is to continue with weight loss efforts. She has agreed to follow the Category 3 plan and journal 400-600 calories and 40+ grams of protein daily. Katherine Smith has been instructed to work up to a goal of 150 minutes of combined cardio and strengthening exercise per week for weight loss and overall health benefits. We discussed  the following Behavioral Modification Strategies today: decreasing simple carbohydrates   Katherine Smith was given a refill on her Saxenda x1 month supply (continue at 2.4 mg).  Katherine Smith has agreed to follow-up with our clinic in 3-4 weeks. She was informed of the importance of frequent follow-up visits to maximize her success  with intensive lifestyle modifications for her multiple health conditions.  ALLERGIES: Allergies  Allergen Reactions  . Atorvastatin     Other reaction(s): Muscle Pain  . Dexfenfluramine     Other reaction(s): Unknown    MEDICATIONS: Current Outpatient Medications on File Prior to Visit  Medication Sig Dispense Refill  . Aspirin-Acetaminophen-Caffeine (EXCEDRIN MIGRAINE PO) Take 1 tablet by mouth daily as needed.    . Calcipotriene-Betameth Diprop (ENSTILAR) 0.005-0.064 % FOAM Apply topically.    . clobetasol (TEMOVATE) 0.05 % external solution Apply 1 application topically 2 (two) times daily.    Marland Kitchen ezetimibe (ZETIA) 10 MG tablet TAKE 1 TABLET BY MOUTH ONCE DAILY.    Marland Kitchen Insulin Pen Needle (BD PEN NEEDLE NANO 2ND GEN) 32G X 4 MM MISC 1 Package by Does not apply route 2 (two) times daily. 100 each 0  . levothyroxine (SYNTHROID) 150 MCG tablet Take 150 mcg by mouth daily before breakfast.    . lisinopril-hydrochlorothiazide (PRINZIDE,ZESTORETIC) 10-12.5 MG tablet Take 1 tablet by mouth daily.    . Multiple Vitamins-Minerals (HAIR SKIN & NAILS ADVANCED PO) Take 1 tablet by mouth daily.    . naproxen sodium (ANAPROX) 220 MG tablet Take 220 mg by mouth daily as needed.    . pantoprazole (PROTONIX) 40 MG tablet TAKE ONE TABLET BY MOUTH ONCE DAILY    . traZODone (DESYREL) 50 MG tablet Take 50 mg by mouth at bedtime as needed for sleep.     No current facility-administered medications on file prior to visit.    PAST MEDICAL HISTORY: Past Medical History:  Diagnosis Date  . Anxiety   . Back pain   . Cancer (HCC)    BASAL CELL SKIN CANCER  . Depression   . Fatty liver   . GERD (gastroesophageal reflux disease)   . Hashimoto's thyroiditis   . HTN (hypertension)   . Hyperlipidemia   . Hypothyroidism   . IBS (irritable bowel syndrome)   . Insomnia   . Joint pain   . Lactose intolerance   . Lichen sclerosus   . Obesity   . Psoriasis   . Sleep apnea     PAST SURGICAL  HISTORY: Past Surgical History:  Procedure Laterality Date  . ANTERIOR DISC ARTHROPLASTY     2004  . COLONOSCOPY  12/18/2011  . COLONOSCOPY WITH PROPOFOL N/A 04/04/2017   Procedure: COLONOSCOPY WITH PROPOFOL;  Surgeon: Manya Silvas, MD;  Location: [facility] ENDOSCOPY;  Service: Endoscopy;  Laterality: N/A;    SOCIAL HISTORY: Social History   Tobacco Use  . Smoking status: Never Smoker  . Smokeless tobacco: Never Used  Substance Use Topics  . Alcohol use: No  . Drug use: No    FAMILY HISTORY: Family History  Problem Relation Age of Onset  . Hypertension Mother   . Hyperlipidemia Mother   . Hypertension Father   . Breast cancer Neg Hx    ROS: Review of Systems  Constitutional: Positive for weight loss.  Gastrointestinal: Negative for nausea and vomiting.  Endo/Heme/Allergies:       Negative for hypoglycemia.   PHYSICAL EXAM: Blood pressure 115/77, pulse 79, temperature 97.9 F (36.6 C), temperature source Oral, height 5\' 3"  (1.6 m),  weight 216 lb (98 kg), last menstrual period 04/14/2016, SpO2 99 %. Body mass index is 38.26 kg/m. Physical Exam Vitals reviewed.  Constitutional:      Appearance: Normal appearance. She is obese.  Cardiovascular:     Rate and Rhythm: Normal rate.     Pulses: Normal pulses.  Pulmonary:     Effort: Pulmonary effort is normal.     Breath sounds: Normal breath sounds.  Musculoskeletal:        General: Normal range of motion.  Skin:    General: Skin is warm and dry.  Neurological:     Mental Status: She is alert and oriented to person, place, and time.  Psychiatric:        Behavior: Behavior normal.   RECENT LABS AND TESTS: BMET    Component Value Date/Time   NA 139 05/08/2019 1252   K 4.5 05/08/2019 1252   CL 102 05/08/2019 1252   CO2 23 05/08/2019 1252   GLUCOSE 103 (H) 05/08/2019 1252   GLUCOSE 110 (H) 01/07/2019 0930   BUN 21 05/08/2019 1252   CREATININE 0.91 05/08/2019 1252   CALCIUM 10.1 05/08/2019 1252   GFRNONAA 69  05/08/2019 1252   GFRAA 80 05/08/2019 1252   Lab Results  Component Value Date   HGBA1C 5.1 11/23/2016   HGBA1C 5.3 08/10/2016   Lab Results  Component Value Date   INSULIN 25.8 (H) 05/08/2019   INSULIN 46.2 (H) 10/02/2018   INSULIN 27.7 (H) 11/23/2016   INSULIN 38.9 (H) 08/10/2016   CBC    Component Value Date/Time   WBC 8.8 01/07/2019 0930   RBC 4.54 01/07/2019 0930   HGB 12.8 01/07/2019 0930   HGB 13.7 08/10/2016 1556   HCT 38.9 01/07/2019 0930   HCT 41.3 08/10/2016 1556   PLT 334 01/07/2019 0930   MCV 85.7 01/07/2019 0930   MCV 84 08/10/2016 1556   MCH 28.2 01/07/2019 0930   MCHC 32.9 01/07/2019 0930   RDW 13.9 01/07/2019 0930   RDW 14.0 08/10/2016 1556   LYMPHSABS 2.1 08/10/2016 1556   EOSABS 0.2 08/10/2016 1556   BASOSABS 0.1 08/10/2016 1556   Iron/TIBC/Ferritin/ %Sat No results found for: IRON, TIBC, FERRITIN, IRONPCTSAT Lipid Panel     Component Value Date/Time   CHOL 187 11/23/2016 0832   TRIG 101 11/23/2016 0832   HDL 66 11/23/2016 0832   LDLCALC 101 (H) 11/23/2016 0832   Hepatic Function Panel     Component Value Date/Time   PROT 7.2 05/08/2019 1252   ALBUMIN 4.5 05/08/2019 1252   AST 20 05/08/2019 1252   ALT 22 05/08/2019 1252   ALKPHOS 80 05/08/2019 1252   BILITOT 0.5 05/08/2019 1252      Component Value Date/Time   TSH 0.011 (L) 11/23/2016 0832   TSH 1.170 08/10/2016 1556       I, Michaelene Song, am acting as Location manager for Dennard Nip, MD I have reviewed the above documentation for accuracy and completeness, and I agree with the above. -Dennard Nip, MD

## 2019-08-06 LAB — COMPREHENSIVE METABOLIC PANEL
ALT: 19 IU/L (ref 0–32)
AST: 20 IU/L (ref 0–40)
Albumin/Globulin Ratio: 1.5 (ref 1.2–2.2)
Albumin: 4.3 g/dL (ref 3.8–4.9)
Alkaline Phosphatase: 74 IU/L (ref 39–117)
BUN/Creatinine Ratio: 26 — ABNORMAL HIGH (ref 9–23)
BUN: 23 mg/dL (ref 6–24)
Bilirubin Total: 0.6 mg/dL (ref 0.0–1.2)
CO2: 22 mmol/L (ref 20–29)
Calcium: 9.8 mg/dL (ref 8.7–10.2)
Chloride: 103 mmol/L (ref 96–106)
Creatinine, Ser: 0.87 mg/dL (ref 0.57–1.00)
GFR calc Af Amer: 84 mL/min/{1.73_m2} (ref >59)
GFR calc non Af Amer: 73 mL/min/{1.73_m2} (ref >59)
Globulin, Total: 2.8 g/dL (ref 1.5–4.5)
Glucose: 92 mg/dL (ref 65–99)
Potassium: 4.7 mmol/L (ref 3.5–5.2)
Sodium: 139 mmol/L (ref 134–144)
Total Protein: 7.1 g/dL (ref 6.0–8.5)

## 2019-08-06 LAB — INSULIN, RANDOM: INSULIN: 16.3 u[IU]/mL (ref 2.6–24.9)

## 2019-08-06 LAB — HEMOGLOBIN A1C
Est. average glucose Bld gHb Est-mCnc: 103 mg/dL
Hgb A1c MFr Bld: 5.2 % (ref 4.8–5.6)

## 2019-08-06 LAB — VITAMIN D 25 HYDROXY (VIT D DEFICIENCY, FRACTURES): Vit D, 25-Hydroxy: 37.6 ng/mL (ref 30.0–100.0)

## 2019-08-09 DIAGNOSIS — G4733 Obstructive sleep apnea (adult) (pediatric): Secondary | ICD-10-CM | POA: Diagnosis not present

## 2019-08-09 DIAGNOSIS — I10 Essential (primary) hypertension: Secondary | ICD-10-CM | POA: Diagnosis not present

## 2019-08-27 ENCOUNTER — Other Ambulatory Visit: Payer: Self-pay

## 2019-08-27 ENCOUNTER — Ambulatory Visit (INDEPENDENT_AMBULATORY_CARE_PROVIDER_SITE_OTHER): Payer: 59 | Admitting: Family Medicine

## 2019-08-27 ENCOUNTER — Encounter (INDEPENDENT_AMBULATORY_CARE_PROVIDER_SITE_OTHER): Payer: Self-pay | Admitting: Family Medicine

## 2019-08-27 VITALS — BP 104/69 | HR 83 | Temp 97.9°F | Ht 63.0 in | Wt 215.0 lb

## 2019-08-27 DIAGNOSIS — Z9189 Other specified personal risk factors, not elsewhere classified: Secondary | ICD-10-CM | POA: Diagnosis not present

## 2019-08-27 DIAGNOSIS — F3289 Other specified depressive episodes: Secondary | ICD-10-CM

## 2019-08-27 DIAGNOSIS — E559 Vitamin D deficiency, unspecified: Secondary | ICD-10-CM | POA: Diagnosis not present

## 2019-08-27 DIAGNOSIS — Z6838 Body mass index (BMI) 38.0-38.9, adult: Secondary | ICD-10-CM

## 2019-08-27 MED ORDER — ESCITALOPRAM OXALATE 10 MG PO TABS: 10.0000 mg | ORAL_TABLET | ORAL | 0 refills | Status: DC

## 2019-08-27 MED ORDER — VITAMIN D (ERGOCALCIFEROL) 1.25 MG (50000 UNIT) PO CAPS: 50000.0000 [IU] | ORAL_CAPSULE | ORAL | 0 refills | Status: DC

## 2019-09-01 NOTE — Progress Notes (Signed)
Chief Complaint:   OBESITY Katherine Smith is here to discuss her progress with her obesity treatment plan along with follow-up of her obesity related diagnoses. Katherine Smith is on the Category 3 Plan and keeping a food journal and adhering to recommended goals of 400-600 calories and 40+ grams of protein and states she is following her eating plan approximately 85% of the time. Katherine Smith states she is doing 0 minutes 0 times per week.  Today's visit was #: 30 Starting weight: 240 lbs Starting date: 08/10/16 Today's weight: 215 lbs Today's date: 08/27/2019 Total lbs lost to date: 25 Total lbs lost since last in-office visit: 1  Interim History: Katherine Smith has done well with weight loss even over the holidays. Her hunger is controlled on her plan and with Saxenda. She notes minimal GI upset.  Subjective:   1. Vitamin D deficiency Katherine Smith is stable on Vit D. Her Vit D level is improving, but level is not yet at goal.  2. Other depression, with emotional eating Katherine Smith feels her mood has improved on Lexapro. She is doing well with stress and deceasing emotional eating.  3. At risk for heart disease Katherine Smith is at a higher than average risk for cardiovascular disease due to obesity. Reviewed: no chest pain on exertion, no dyspnea on exertion, and no swelling of ankles.  Assessment/Plan:   1. Vitamin D deficiency Low Vitamin D level contributes to fatigue and are associated with obesity, breast, and colon cancer. We will refill prescription Vit D. She will follow-up for routine testing of Vitamin D, at least 2-3 times per year to avoid over-replacement. We will continue to monitor.  - Vitamin D, Ergocalciferol, (DRISDOL) 1.25 MG (50000 UNIT) CAPS capsule; Take 1 capsule (50,000 Units total) by mouth every 7 (seven) days.  Dispense: 4 capsule; Refill: 0  2. Other depression, with emotional eating Behavior modification techniques were discussed today to help Katherine Smith deal with her emotional/non-hunger eating  behaviors. We will refill Lexapro for 1 month. Orders and follow up as documented in patient record.   - escitalopram (LEXAPRO) 10 MG tablet; Take 1 tablet (10 mg total) by mouth every morning.  Dispense: 30 tablet; Refill: 0  3. At risk for heart disease Katherine Smith was given approximately 15 minutes of coronary artery disease prevention counseling today. She is 59 y.o. female and has risk factors for heart disease including obesity. We discussed intensive lifestyle modifications today with an emphasis on specific weight loss instructions and strategies.   4. Class 2 severe obesity with serious comorbidity and body mass index (BMI) of 38.0 to 38.9 in adult, unspecified obesity type [facility]) Katherine Smith is currently in the action stage of change. As such, her goal is to continue with weight loss efforts. She has agreed to on the Category 3 Plan and keeping a food journal and adhering to recommended goals of 400-600 calories and 40 grams of protein at supper daily.   We discussed various medication options to help Katherine Smith with her weight loss efforts and we both agreed to continue Saxenda as is, no refill needed today.  Behavioral modification strategies: meal planning and cooking strategies.  Katherine Smith has agreed to follow-up with our clinic in 3 weeks. She was informed of the importance of frequent follow-up visits to maximize her success with intensive lifestyle modifications for her multiple health conditions.   Objective:   Blood pressure 104/69, pulse 83, temperature 97.9 F (36.6 C), temperature source Oral, height 5\' 3"  (1.6 m), weight 215 lb (97.5 kg), last  menstrual period 04/14/2016, SpO2 99 %. Body mass index is 38.09 kg/m.  General: Cooperative, alert, well developed, in no acute distress. HEENT: Conjunctivae and lids unremarkable. Cardiovascular: Regular rhythm.  Lungs: Normal work of breathing. Neurologic: No focal deficits.   Lab Results  Component Value Date   CREATININE 0.87 08/05/2019    BUN 23 08/05/2019   NA 139 08/05/2019   K 4.7 08/05/2019   CL 103 08/05/2019   CO2 22 08/05/2019   Lab Results  Component Value Date   ALT 19 08/05/2019   AST 20 08/05/2019   ALKPHOS 74 08/05/2019   BILITOT 0.6 08/05/2019   Lab Results  Component Value Date   HGBA1C 5.2 08/05/2019   HGBA1C 5.1 11/23/2016   HGBA1C 5.3 08/10/2016   Lab Results  Component Value Date   INSULIN 16.3 08/05/2019   INSULIN 25.8 (H) 05/08/2019   INSULIN 46.2 (H) 10/02/2018   INSULIN 27.7 (H) 11/23/2016   INSULIN 38.9 (H) 08/10/2016   Lab Results  Component Value Date   TSH 0.011 (L) 11/23/2016   Lab Results  Component Value Date   CHOL 187 11/23/2016   HDL 66 11/23/2016   LDLCALC 101 (H) 11/23/2016   TRIG 101 11/23/2016   Lab Results  Component Value Date   WBC 8.8 01/07/2019   HGB 12.8 01/07/2019   HCT 38.9 01/07/2019   MCV 85.7 01/07/2019   PLT 334 01/07/2019   No results found for: IRON, TIBC, FERRITIN  Attestation Statements:   Reviewed by clinician on day of visit: allergies, medications, problem list, medical history, surgical history, family history, social history, and previous encounter notes.   I, Katherine Smith, am acting as transcriptionist for Dennard Nip, MD.  I have reviewed the above documentation for accuracy and completeness, and I agree with the above. -  Dennard Nip, MD

## 2019-09-17 ENCOUNTER — Other Ambulatory Visit: Payer: Self-pay

## 2019-09-17 ENCOUNTER — Ambulatory Visit (INDEPENDENT_AMBULATORY_CARE_PROVIDER_SITE_OTHER): Payer: 59 | Admitting: Family Medicine

## 2019-09-17 ENCOUNTER — Encounter (INDEPENDENT_AMBULATORY_CARE_PROVIDER_SITE_OTHER): Payer: Self-pay | Admitting: Family Medicine

## 2019-09-17 VITALS — BP 111/74 | HR 87 | Temp 97.7°F | Ht 63.0 in | Wt 214.0 lb

## 2019-09-17 DIAGNOSIS — Z135 Encounter for screening for eye and ear disorders: Secondary | ICD-10-CM | POA: Diagnosis not present

## 2019-09-17 DIAGNOSIS — Z6837 Body mass index (BMI) 37.0-37.9, adult: Secondary | ICD-10-CM | POA: Diagnosis not present

## 2019-09-17 DIAGNOSIS — F3289 Other specified depressive episodes: Secondary | ICD-10-CM

## 2019-09-17 DIAGNOSIS — H25013 Cortical age-related cataract, bilateral: Secondary | ICD-10-CM | POA: Diagnosis not present

## 2019-09-17 DIAGNOSIS — I10 Essential (primary) hypertension: Secondary | ICD-10-CM | POA: Diagnosis not present

## 2019-09-17 DIAGNOSIS — H21233 Degeneration of iris (pigmentary), bilateral: Secondary | ICD-10-CM | POA: Diagnosis not present

## 2019-09-17 DIAGNOSIS — H5213 Myopia, bilateral: Secondary | ICD-10-CM | POA: Diagnosis not present

## 2019-09-17 DIAGNOSIS — H35033 Hypertensive retinopathy, bilateral: Secondary | ICD-10-CM | POA: Diagnosis not present

## 2019-09-18 NOTE — Progress Notes (Signed)
Chief Complaint:   OBESITY Katherine Smith is here to discuss her progress with her obesity treatment plan along with follow-up of her obesity related diagnoses. Katherine Smith is on the Category 3 Plan and keeping a food journal and adhering to recommended goals of 400-600 calories and 40 grams of protein and states she is following her eating plan approximately 90% of the time. Katherine Smith states she is walking 7,500 steps 4-5 times per week.  Today's visit was #: 24 Starting weight: 240 lbs Starting date: 08/10/16 Today's weight: 214 lbs Today's date: 09/17/2019 Total lbs lost to date: 26 Total lbs lost since last in-office visit: 1  Interim History: Katherine Smith continues to lose weight, but is frustrated that she is losing slower than expected. Her hunger is controlled and she is doing well increasing steps at work.  Subjective:   1. Other depression, with emotional eating Katherine Smith's mood is stable on Lexapro. She still struggles with emotional eating, but seems to be in better control of it.  Assessment/Plan:   1. Other depression, with emotional eating Behavior modification techniques were discussed today to help Katherine Smith deal with her emotional/non-hunger eating behaviors. Katherine Smith agreed to continue Lexapro as is, and will continue to monitor. Orders and follow up as documented in patient record.   2. Class 2 severe obesity with serious comorbidity and body mass index (BMI) of 37.0 to 37.9 in adult, unspecified obesity type [facility]) Katherine Smith is currently in the action stage of change. As such, her goal is to continue with weight loss efforts. She has agreed to the Category 3 Plan.   We discussed various medication options to help Katherine Smith with her weight loss efforts and we both agreed to continue Saxenda as is, and we will recheck IC to look for decreased RMR at her next visit.  Exercise goals: Katherine Smith is to continue her current exercise regimen as is, and add exercise bands for 10-15 minutes a  day.  Behavioral modification strategies: increasing lean protein intake and meal planning and cooking strategies.  Katherine Smith has agreed to follow-up with our clinic in 3 weeks. She was informed of the importance of frequent follow-up visits to maximize her success with intensive lifestyle modifications for her multiple health conditions.   Objective:   Blood pressure 111/74, pulse 87, temperature 97.7 F (36.5 C), temperature source Oral, height 5\' 3"  (1.6 m), weight 214 lb (97.1 kg), last menstrual period 04/14/2016, SpO2 98 %. Body mass index is 37.91 kg/m.  General: Cooperative, alert, well developed, in no acute distress. HEENT: Conjunctivae and lids unremarkable. Cardiovascular: Regular rhythm.  Lungs: Normal work of breathing. Neurologic: No focal deficits.   Lab Results  Component Value Date   CREATININE 0.87 08/05/2019   BUN 23 08/05/2019   NA 139 08/05/2019   K 4.7 08/05/2019   CL 103 08/05/2019   CO2 22 08/05/2019   Lab Results  Component Value Date   ALT 19 08/05/2019   AST 20 08/05/2019   ALKPHOS 74 08/05/2019   BILITOT 0.6 08/05/2019   Lab Results  Component Value Date   HGBA1C 5.2 08/05/2019   HGBA1C 5.1 11/23/2016   HGBA1C 5.3 08/10/2016   Lab Results  Component Value Date   INSULIN 16.3 08/05/2019   INSULIN 25.8 (H) 05/08/2019   INSULIN 46.2 (H) 10/02/2018   INSULIN 27.7 (H) 11/23/2016   INSULIN 38.9 (H) 08/10/2016   Lab Results  Component Value Date   TSH 0.011 (L) 11/23/2016   Lab Results  Component Value Date  CHOL 187 11/23/2016   HDL 66 11/23/2016   LDLCALC 101 (H) 11/23/2016   TRIG 101 11/23/2016   Lab Results  Component Value Date   WBC 8.8 01/07/2019   HGB 12.8 01/07/2019   HCT 38.9 01/07/2019   MCV 85.7 01/07/2019   PLT 334 01/07/2019   No results found for: IRON, TIBC, FERRITIN  Attestation Statements:   Reviewed by clinician on day of visit: allergies, medications, problem list, medical history, surgical history, family  history, social history, and previous encounter notes.  Time spent on visit including pre-visit chart review and post-visit care was 21 minutes.    I, Trixie Dredge, am acting as transcriptionist for Dennard Nip, MD.  I have reviewed the above documentation for accuracy and completeness, and I agree with the above. -  Dennard Nip, MD

## 2019-10-07 DIAGNOSIS — I10 Essential (primary) hypertension: Secondary | ICD-10-CM | POA: Diagnosis not present

## 2019-10-07 DIAGNOSIS — Z131 Encounter for screening for diabetes mellitus: Secondary | ICD-10-CM | POA: Diagnosis not present

## 2019-10-07 DIAGNOSIS — E782 Mixed hyperlipidemia: Secondary | ICD-10-CM | POA: Diagnosis not present

## 2019-10-08 ENCOUNTER — Ambulatory Visit (INDEPENDENT_AMBULATORY_CARE_PROVIDER_SITE_OTHER): Payer: 59 | Admitting: Family Medicine

## 2019-10-08 ENCOUNTER — Other Ambulatory Visit: Payer: Self-pay

## 2019-10-08 VITALS — BP 105/71 | HR 83 | Temp 98.1°F | Ht 63.0 in | Wt 213.0 lb

## 2019-10-08 DIAGNOSIS — E8881 Metabolic syndrome: Secondary | ICD-10-CM

## 2019-10-08 DIAGNOSIS — Z6837 Body mass index (BMI) 37.0-37.9, adult: Secondary | ICD-10-CM

## 2019-10-08 DIAGNOSIS — Z9189 Other specified personal risk factors, not elsewhere classified: Secondary | ICD-10-CM

## 2019-10-08 DIAGNOSIS — E559 Vitamin D deficiency, unspecified: Secondary | ICD-10-CM

## 2019-10-08 DIAGNOSIS — G4733 Obstructive sleep apnea (adult) (pediatric): Secondary | ICD-10-CM

## 2019-10-08 DIAGNOSIS — F3289 Other specified depressive episodes: Secondary | ICD-10-CM | POA: Diagnosis not present

## 2019-10-08 MED ORDER — VITAMIN D (ERGOCALCIFEROL) 1.25 MG (50000 UNIT) PO CAPS: 50000.0000 [IU] | ORAL_CAPSULE | ORAL | 0 refills | Status: DC

## 2019-10-08 MED ORDER — ESCITALOPRAM OXALATE 10 MG PO TABS: 10.0000 mg | ORAL_TABLET | ORAL | 0 refills | Status: DC

## 2019-10-08 NOTE — Progress Notes (Signed)
Chief Complaint:   OBESITY Katherine Smith is here to discuss her progress with her obesity treatment plan along with follow-up of her obesity related diagnoses. Katherine Smith is on the Category 3 Plan and states she is following her eating plan approximately 85% of the time. Katherine Smith states she is doing steps and resistance bands for 20 minutes 4 times per week.  Today's visit was #: 64 Starting weight: 240 lbs Starting date: 08/10/16 Today's weight: 213 lbs Today's date: 10/08/2019 Total lbs lost to date: 27 Total lbs lost since last in-office visit: 1  Interim History: Katherine Smith is continuing to lose weight. She is tolerating Saxenda well. She has been concerned that she isn't losing weight faster. Her RMR had been very high and has now decreased to 1675, which is the expected level for her current age, height, weight, and muscle mass, but explains why her weight loss has been slower than expected.  Subjective:   1. Vitamin D deficiency Katherine Smith is stable on Vit D prescription. She requests a refill today.  2. Other depression, with emotional eating Katherine Smith is stable on Lexapro. She requests a refill today.  3. Insulin resistance Katherine Smith is on liraglutide, and her A1c is well controlled and fasting insulin is still elevated but improving.  4. Obstructive sleep apnea syndrome Katherine Smith is working on weight loss but she is struggling to wear her CPAP, and weight loss efforts are not as successful.  5. At risk for heart disease Katherine Smith is at a higher than average risk for cardiovascular disease due to uncontrolled sleep apnea. Reviewed: no chest pain on exertion, no dyspnea on exertion, and no swelling of ankles.  Assessment/Plan:   1. Vitamin D deficiency Low Vitamin D level contributes to fatigue and are associated with obesity, breast, and colon cancer. We will refill prescription Vitamin D 50,000 IU every week for 1 month. Katherine Smith will follow-up for routine testing of Vitamin D, at least 2-3 times  per year to avoid over-replacement.  - Vitamin D, Ergocalciferol, (DRISDOL) 1.25 MG (50000 UNIT) CAPS capsule; Take 1 capsule (50,000 Units total) by mouth every 7 (seven) days.  Dispense: 4 capsule; Refill: 0  2. Other depression, with emotional eating Behavior modification techniques were discussed today to help Katherine Smith deal with her emotional/non-hunger eating behaviors. We will refill Lexapro for 1 month. Orders and follow up as documented in patient record.   - escitalopram (LEXAPRO) 10 MG tablet; Take 1 tablet (10 mg total) by mouth every morning.  Dispense: 30 tablet; Refill: 0  3. Insulin resistance Katherine Smith will continue to work on weight loss, diet, exercise, and decreasing simple carbohydrates to help decrease the risk of diabetes. Katherine Smith agreed to follow-up with [place] as directed to closely monitor her progress.  4. Obstructive sleep apnea syndrome Katherine Smith's REE has decreased, likely due to low oxygen while sleeping. She was encouraged to use CPAP regularly, may need a new sleep mask. Intensive lifestyle modifications are the first line treatment for this issue. We discussed several lifestyle modifications today and she will continue to work on diet, exercise and weight loss efforts. We will continue to monitor. Orders and follow up as documented in patient record.   5. At risk for heart disease Katherine Smith was given approximately 15 minutes of coronary artery disease prevention counseling today. She is 59 y.o. female and has risk factors for heart disease including obesity. We discussed intensive lifestyle modifications today with an emphasis on specific weight loss instructions and strategies.   Repetitive spaced learning  was employed today to elicit superior memory formation and behavioral change.  6. Class 2 severe obesity with serious comorbidity and body mass index (BMI) of 37.0 to 37.9 in adult, unspecified obesity type [facility]) Katherine Smith is currently in the action stage of change. As such, her  goal is to continue with weight loss efforts. She has agreed to keeping a food journal and adhering to recommended goals of 1500 calories and 100+ grams of protein daily.   Exercise goals: Katherine Smith is to continue her current exercise regimen as is.  Behavioral modification strategies: increasing lean protein intake and decreasing simple carbohydrates.  Katherine Smith has agreed to follow-up with our clinic in 3 weeks. She was informed of the importance of frequent follow-up visits to maximize her success with intensive lifestyle modifications for her multiple health conditions.   Objective:   Blood pressure 105/71, pulse 83, temperature 98.1 F (36.7 C), height 5\' 3"  (1.6 m), weight 213 lb (96.6 kg), last menstrual period 04/14/2016, SpO2 99 %. Body mass index is 37.73 kg/m.  General: Cooperative, alert, well developed, in no acute distress. HEENT: Conjunctivae and lids unremarkable. Cardiovascular: Regular rhythm.  Lungs: Normal work of breathing. Neurologic: No focal deficits.   Lab Results  Component Value Date   CREATININE 0.87 08/05/2019   BUN 23 08/05/2019   NA 139 08/05/2019   K 4.7 08/05/2019   CL 103 08/05/2019   CO2 22 08/05/2019   Lab Results  Component Value Date   ALT 19 08/05/2019   AST 20 08/05/2019   ALKPHOS 74 08/05/2019   BILITOT 0.6 08/05/2019   Lab Results  Component Value Date   HGBA1C 5.2 08/05/2019   HGBA1C 5.1 11/23/2016   HGBA1C 5.3 08/10/2016   Lab Results  Component Value Date   INSULIN 16.3 08/05/2019   INSULIN 25.8 (H) 05/08/2019   INSULIN 46.2 (H) 10/02/2018   INSULIN 27.7 (H) 11/23/2016   INSULIN 38.9 (H) 08/10/2016   Lab Results  Component Value Date   TSH 0.011 (L) 11/23/2016   Lab Results  Component Value Date   CHOL 187 11/23/2016   HDL 66 11/23/2016   LDLCALC 101 (H) 11/23/2016   TRIG 101 11/23/2016   Lab Results  Component Value Date   WBC 8.8 01/07/2019   HGB 12.8 01/07/2019   HCT 38.9 01/07/2019   MCV 85.7 01/07/2019    PLT 334 01/07/2019   No results found for: IRON, TIBC, FERRITIN  Attestation Statements:   Reviewed by clinician on day of visit: allergies, medications, problem list, medical history, surgical history, family history, social history, and previous encounter notes.   I, Trixie Dredge, am acting as transcriptionist for Dennard Nip, MD.  I have reviewed the above documentation for accuracy and completeness, and I agree with the above. -  Dennard Nip, MD

## 2019-10-14 DIAGNOSIS — E782 Mixed hyperlipidemia: Secondary | ICD-10-CM | POA: Diagnosis not present

## 2019-10-14 DIAGNOSIS — E039 Hypothyroidism, unspecified: Secondary | ICD-10-CM | POA: Diagnosis not present

## 2019-10-14 DIAGNOSIS — Z0000 Encounter for general adult medical examination without abnormal findings: Secondary | ICD-10-CM | POA: Diagnosis not present

## 2019-10-14 DIAGNOSIS — I10 Essential (primary) hypertension: Secondary | ICD-10-CM | POA: Diagnosis not present

## 2019-10-14 DIAGNOSIS — Z23 Encounter for immunization: Secondary | ICD-10-CM | POA: Diagnosis not present

## 2019-10-16 DIAGNOSIS — H25812 Combined forms of age-related cataract, left eye: Secondary | ICD-10-CM | POA: Diagnosis not present

## 2019-10-16 DIAGNOSIS — Z01818 Encounter for other preprocedural examination: Secondary | ICD-10-CM | POA: Diagnosis not present

## 2019-10-16 DIAGNOSIS — H401331 Pigmentary glaucoma, bilateral, mild stage: Secondary | ICD-10-CM | POA: Diagnosis not present

## 2019-10-16 DIAGNOSIS — H25811 Combined forms of age-related cataract, right eye: Secondary | ICD-10-CM | POA: Diagnosis not present

## 2019-11-04 ENCOUNTER — Ambulatory Visit (INDEPENDENT_AMBULATORY_CARE_PROVIDER_SITE_OTHER): Payer: 59 | Admitting: Family Medicine

## 2019-11-10 ENCOUNTER — Ambulatory Visit (INDEPENDENT_AMBULATORY_CARE_PROVIDER_SITE_OTHER): Payer: 59 | Admitting: Family Medicine

## 2019-11-10 ENCOUNTER — Other Ambulatory Visit: Payer: Self-pay

## 2019-11-10 ENCOUNTER — Encounter (INDEPENDENT_AMBULATORY_CARE_PROVIDER_SITE_OTHER): Payer: Self-pay | Admitting: Family Medicine

## 2019-11-10 VITALS — BP 104/68 | HR 78 | Temp 98.0°F | Ht 63.0 in | Wt 209.0 lb

## 2019-11-10 DIAGNOSIS — E559 Vitamin D deficiency, unspecified: Secondary | ICD-10-CM

## 2019-11-10 DIAGNOSIS — I10 Essential (primary) hypertension: Secondary | ICD-10-CM | POA: Diagnosis not present

## 2019-11-10 DIAGNOSIS — Z9189 Other specified personal risk factors, not elsewhere classified: Secondary | ICD-10-CM

## 2019-11-10 DIAGNOSIS — F3289 Other specified depressive episodes: Secondary | ICD-10-CM

## 2019-11-10 DIAGNOSIS — Z6837 Body mass index (BMI) 37.0-37.9, adult: Secondary | ICD-10-CM

## 2019-11-10 MED ORDER — ESCITALOPRAM OXALATE 10 MG PO TABS: 10.0000 mg | ORAL_TABLET | ORAL | 0 refills | Status: DC

## 2019-11-10 MED ORDER — LISINOPRIL 10 MG PO TABS: 10.0000 mg | ORAL_TABLET | Freq: Every day | ORAL | 0 refills | Status: DC

## 2019-11-10 MED ORDER — VITAMIN D (ERGOCALCIFEROL) 1.25 MG (50000 UNIT) PO CAPS: 50000.0000 [IU] | ORAL_CAPSULE | ORAL | 0 refills | Status: DC

## 2019-11-11 DIAGNOSIS — G4733 Obstructive sleep apnea (adult) (pediatric): Secondary | ICD-10-CM | POA: Diagnosis not present

## 2019-11-11 DIAGNOSIS — I10 Essential (primary) hypertension: Secondary | ICD-10-CM | POA: Diagnosis not present

## 2019-11-11 NOTE — Progress Notes (Signed)
Chief Complaint:   OBESITY Katherine Smith is here to discuss her progress with her obesity treatment plan along with follow-up of her obesity related diagnoses. Katherine Smith is on keeping a food journal and adhering to recommended goals of 1500 calories and 100+ grams of protein daily and states she is following her eating plan approximately 85% of the time. Katherine Smith states she is walking and using resistance bands for 30 minutes 3 times per week.  Today's visit was #: 30 Starting weight: 240 lbs Starting date: 08/10/2016 Today's weight: 209 lbs Today's date: 11/10/2019 Total lbs lost to date: 31 Total lbs lost since last in-office visit: 4  Interim History: Katherine Smith denies excessive physical hunger, but will be challenged with psychological hunger. She is aware that she is making better choices on a regular basis. She would like to continue.  Subjective:   1. Vitamin D deficiency Katherine Smith's last Vit D level was 37.6 on 08/05/2019. She is on prescription Vit D supplementation.  2. Essential hypertension Katherine Smith has recently been experiencing dizziness with position change. She is currently on lisinopril-hydrochlorothiazide 10-12.5 mg.  3. Other depression, with emotional eating Katherine Smith reports stable mood, and she would like to remain on escitalopram until her cataract surgery.  4. At risk for complication associated with hypotension The patient is at a higher than average risk of hypotension due to weight loss and combination anti-hypertensives.  Assessment/Plan:   1. Vitamin D deficiency Low Vitamin D level contributes to fatigue and are associated with obesity, breast, and colon cancer. We will refill prescription Vitamin D for 1 month. Katherine Smith will follow-up for routine testing of Vitamin D, at least 2-3 times per year to avoid over-replacement.  - Vitamin D, Ergocalciferol, (DRISDOL) 1.25 MG (50000 UNIT) CAPS capsule; Take 1 capsule (50,000 Units total) by mouth every 7 (seven) days.   Dispense: 4 capsule; Refill: 0  2. Essential hypertension Katherine Smith is working on healthy weight loss and exercise to improve blood pressure control. We will watch for signs of hypotension as she continues her lifestyle modifications. Katherine Smith agreed to discontinue lisinopril-hydrochlorothiazide combination tablet, and start lisinopril 10 mg q daily with no refills.  - lisinopril (ZESTRIL) 10 MG tablet; Take 1 tablet (10 mg total) by mouth daily.  Dispense: 30 tablet; Refill: 0  3. Other depression, with emotional eating Behavior modification techniques were discussed today to help Katherine Smith deal with her emotional/non-hunger eating behaviors. We will refill Lexapro for 1 month. Orders and follow up as documented in patient record.   - escitalopram (LEXAPRO) 10 MG tablet; Take 1 tablet (10 mg total) by mouth every morning.  Dispense: 30 tablet; Refill: 0  4. At risk for complication associated with hypotension Katherine Smith was given approximately 15 minutes of education and counseling today to help avoid hypotension. We discussed risks of hypotension with weight loss and signs of hypotension such as feeling lightheaded or unsteady.  Repetitive spaced learning was employed today to elicit superior memory formation and behavioral change.  5. Class 2 severe obesity with serious comorbidity and body mass index (BMI) of 37.0 to 37.9 in adult, unspecified obesity type [facility]) Katherine Smith is currently in the action stage of change. As such, her goal is to continue with weight loss efforts. She has agreed to keeping a food journal and adhering to recommended goals of 1500 calories and 100+ grams of protein daily.   Exercise goals: As is.  Behavioral modification strategies: increasing lean protein intake.  Katherine Smith has agreed to follow-up with our clinic in  4 weeks. She was informed of the importance of frequent follow-up visits to maximize her success with intensive lifestyle modifications for her multiple health  conditions.   Objective:   Blood pressure 104/68, pulse 78, temperature 98 F (36.7 C), temperature source Oral, height 5\' 3"  (1.6 m), weight 209 lb (94.8 kg), last menstrual period 04/14/2016, SpO2 98 %. Body mass index is 37.02 kg/m.  General: Cooperative, alert, well developed, in no acute distress. HEENT: Conjunctivae and lids unremarkable. Cardiovascular: Regular rhythm.  Lungs: Normal work of breathing. Neurologic: No focal deficits.   Lab Results  Component Value Date   CREATININE 0.87 08/05/2019   BUN 23 08/05/2019   NA 139 08/05/2019   K 4.7 08/05/2019   CL 103 08/05/2019   CO2 22 08/05/2019   Lab Results  Component Value Date   ALT 19 08/05/2019   AST 20 08/05/2019   ALKPHOS 74 08/05/2019   BILITOT 0.6 08/05/2019   Lab Results  Component Value Date   HGBA1C 5.2 08/05/2019   HGBA1C 5.1 11/23/2016   HGBA1C 5.3 08/10/2016   Lab Results  Component Value Date   INSULIN 16.3 08/05/2019   INSULIN 25.8 (H) 05/08/2019   INSULIN 46.2 (H) 10/02/2018   INSULIN 27.7 (H) 11/23/2016   INSULIN 38.9 (H) 08/10/2016   Lab Results  Component Value Date   TSH 0.011 (L) 11/23/2016   Lab Results  Component Value Date   CHOL 187 11/23/2016   HDL 66 11/23/2016   LDLCALC 101 (H) 11/23/2016   TRIG 101 11/23/2016   Lab Results  Component Value Date   WBC 8.8 01/07/2019   HGB 12.8 01/07/2019   HCT 38.9 01/07/2019   MCV 85.7 01/07/2019   PLT 334 01/07/2019   No results found for: IRON, TIBC, FERRITIN  Attestation Statements:   Reviewed by clinician on day of visit: allergies, medications, problem list, medical history, surgical history, family history, social history, and previous encounter notes.   I, Trixie Dredge, am acting as transcriptionist for Dennard Nip, MD.  I have reviewed the above documentation for accuracy and completeness, and I agree with the above. -  Dennard Nip, MD

## 2019-11-28 DIAGNOSIS — H401321 Pigmentary glaucoma, left eye, mild stage: Secondary | ICD-10-CM | POA: Diagnosis not present

## 2019-11-28 DIAGNOSIS — H25812 Combined forms of age-related cataract, left eye: Secondary | ICD-10-CM | POA: Diagnosis not present

## 2019-11-28 DIAGNOSIS — H401331 Pigmentary glaucoma, bilateral, mild stage: Secondary | ICD-10-CM | POA: Diagnosis not present

## 2019-12-04 DIAGNOSIS — H25812 Combined forms of age-related cataract, left eye: Secondary | ICD-10-CM | POA: Diagnosis not present

## 2019-12-09 ENCOUNTER — Encounter (INDEPENDENT_AMBULATORY_CARE_PROVIDER_SITE_OTHER): Payer: Self-pay | Admitting: Family Medicine

## 2019-12-09 ENCOUNTER — Other Ambulatory Visit: Payer: Self-pay

## 2019-12-09 ENCOUNTER — Ambulatory Visit (INDEPENDENT_AMBULATORY_CARE_PROVIDER_SITE_OTHER): Payer: 59 | Admitting: Family Medicine

## 2019-12-09 VITALS — BP 93/65 | HR 90 | Temp 97.9°F | Ht 63.0 in | Wt 207.0 lb

## 2019-12-09 DIAGNOSIS — I10 Essential (primary) hypertension: Secondary | ICD-10-CM | POA: Diagnosis not present

## 2019-12-09 DIAGNOSIS — Z6836 Body mass index (BMI) 36.0-36.9, adult: Secondary | ICD-10-CM | POA: Diagnosis not present

## 2019-12-09 DIAGNOSIS — E559 Vitamin D deficiency, unspecified: Secondary | ICD-10-CM

## 2019-12-09 DIAGNOSIS — F3289 Other specified depressive episodes: Secondary | ICD-10-CM | POA: Diagnosis not present

## 2019-12-09 DIAGNOSIS — E039 Hypothyroidism, unspecified: Secondary | ICD-10-CM | POA: Diagnosis not present

## 2019-12-09 MED ORDER — LISINOPRIL 10 MG PO TABS: 10.0000 mg | ORAL_TABLET | Freq: Every day | ORAL | 0 refills | Status: DC

## 2019-12-09 MED ORDER — VITAMIN D (ERGOCALCIFEROL) 1.25 MG (50000 UNIT) PO CAPS: 50000.0000 [IU] | ORAL_CAPSULE | ORAL | 0 refills | Status: DC

## 2019-12-09 MED ORDER — ESCITALOPRAM OXALATE 10 MG PO TABS: 10.0000 mg | ORAL_TABLET | ORAL | 0 refills | Status: DC

## 2019-12-10 ENCOUNTER — Other Ambulatory Visit (INDEPENDENT_AMBULATORY_CARE_PROVIDER_SITE_OTHER): Payer: Self-pay | Admitting: Family Medicine

## 2019-12-10 DIAGNOSIS — Z6838 Body mass index (BMI) 38.0-38.9, adult: Secondary | ICD-10-CM

## 2019-12-10 NOTE — Progress Notes (Signed)
Chief Complaint:   OBESITY Katherine Smith is here to discuss her progress with her obesity treatment plan along with follow-up of her obesity related diagnoses. Katherine Smith is on keeping a food journal and adhering to recommended goals of 1500 calories and 100+ grams of protein daily and states she is following her eating plan approximately 80% of the time. Katherine Smith states she is walking for 30 minutes 4 times per week.  Today's visit was #: 90 Starting weight: 240 lbs Starting date: 08/10/2016 Today's weight: 207 lbs Today's date: 12/09/2019 Total lbs lost to date: 33 Total lbs lost since last in-office visit: 2  Interim History: Katherine Smith continues to do well with weight loss. She notes decrease in hunger on Saxenda. She recently increased her dose from 2.4 to 3.0, and she is not having any GI upset. She notes her hunger has decreased enough she may not be eating all the nutrition she should.  Subjective:   1. Essential hypertension Katherine Smith's blood pressure is well controlled even off hydrochlorothiazide. She denies lightheadedness.  2. Vitamin D deficiency Katherine Smith is stable on Vit D, and she denies nausea, vomiting, or muscle weakness.  3. Other depression, with emotional eating Katherine Smith's mood is stable on her medications, and she denies issues with insomnia. She is doing well controlling emotional eating.  Assessment/Plan:   1. Essential hypertension Katherine Smith is working on healthy weight loss and exercise to improve blood pressure control. We will watch for signs of hypotension as she continues her lifestyle modifications. We will refill lisinopril for 1 month.  - lisinopril (ZESTRIL) 10 MG tablet; Take 1 tablet (10 mg total) by mouth daily.  Dispense: 30 tablet; Refill: 0  2. Vitamin D deficiency Low Vitamin D level contributes to fatigue and are associated with obesity, breast, and colon cancer. We will refill prescription Vitamin D for 1 month. Katherine Smith will follow-up for routine testing of  Vitamin D, at least 2-3 times per year to avoid over-replacement.  - Vitamin D, Ergocalciferol, (DRISDOL) 1.25 MG (50000 UNIT) CAPS capsule; Take 1 capsule (50,000 Units total) by mouth every 7 (seven) days.  Dispense: 4 capsule; Refill: 0  3. Other depression, with emotional eating Behavior modification techniques were discussed today to help Katherine Smith deal with her emotional/non-hunger eating behaviors. We will refill Lexapro for 1 month. Orders and follow up as documented in patient record.   - escitalopram (LEXAPRO) 10 MG tablet; Take 1 tablet (10 mg total) by mouth every morning.  Dispense: 30 tablet; Refill: 0  4. Class 2 severe obesity with serious comorbidity and body mass index (BMI) of 36.0 to 36.9 in adult, unspecified obesity type [facility]) Katherine Smith is currently in the action stage of change. As such, her goal is to continue with weight loss efforts. She has agreed to the Category 3 Plan.   We discussed various medication options to help Katherine Smith with her weight loss efforts and we both agreed to continue Saxenda 3.0 mg and nano needles #100 and we will refill for 1 month.  Exercise goals: As is.  Behavioral modification strategies: increasing lean protein intake and no skipping meals.  Katherine Smith has agreed to follow-up with our clinic in 3 to 4 weeks. She was informed of the importance of frequent follow-up visits to maximize her success with intensive lifestyle modifications for her multiple health conditions.   Objective:   Blood pressure 93/65, pulse 90, temperature 97.9 F (36.6 C), temperature source Oral, height 5\' 3"  (1.6 m), weight 207 lb (93.9 kg), last menstrual  period 04/14/2016, SpO2 97 %. Body mass index is 36.67 kg/m.  General: Cooperative, alert, well developed, in no acute distress. HEENT: Conjunctivae and lids unremarkable. Cardiovascular: Regular rhythm.  Lungs: Normal work of breathing. Neurologic: No focal deficits.   Lab Results  Component Value Date    CREATININE 0.87 08/05/2019   BUN 23 08/05/2019   NA 139 08/05/2019   K 4.7 08/05/2019   CL 103 08/05/2019   CO2 22 08/05/2019   Lab Results  Component Value Date   ALT 19 08/05/2019   AST 20 08/05/2019   ALKPHOS 74 08/05/2019   BILITOT 0.6 08/05/2019   Lab Results  Component Value Date   HGBA1C 5.2 08/05/2019   HGBA1C 5.1 11/23/2016   HGBA1C 5.3 08/10/2016   Lab Results  Component Value Date   INSULIN 16.3 08/05/2019   INSULIN 25.8 (H) 05/08/2019   INSULIN 46.2 (H) 10/02/2018   INSULIN 27.7 (H) 11/23/2016   INSULIN 38.9 (H) 08/10/2016   Lab Results  Component Value Date   TSH 0.011 (L) 11/23/2016   Lab Results  Component Value Date   CHOL 187 11/23/2016   HDL 66 11/23/2016   LDLCALC 101 (H) 11/23/2016   TRIG 101 11/23/2016   Lab Results  Component Value Date   WBC 8.8 01/07/2019   HGB 12.8 01/07/2019   HCT 38.9 01/07/2019   MCV 85.7 01/07/2019   PLT 334 01/07/2019   No results found for: IRON, TIBC, FERRITIN  Attestation Statements:   Reviewed by clinician on day of visit: allergies, medications, problem list, medical history, surgical history, family history, social history, and previous encounter notes.   I, Trixie Dredge, am acting as transcriptionist for Dennard Nip, MD.  I have reviewed the above documentation for accuracy and completeness, and I agree with the above. -  Dennard Nip, MD

## 2019-12-12 DIAGNOSIS — H401331 Pigmentary glaucoma, bilateral, mild stage: Secondary | ICD-10-CM | POA: Diagnosis not present

## 2019-12-12 DIAGNOSIS — H2511 Age-related nuclear cataract, right eye: Secondary | ICD-10-CM | POA: Diagnosis not present

## 2019-12-12 DIAGNOSIS — H25811 Combined forms of age-related cataract, right eye: Secondary | ICD-10-CM | POA: Diagnosis not present

## 2019-12-12 DIAGNOSIS — H401311 Pigmentary glaucoma, right eye, mild stage: Secondary | ICD-10-CM | POA: Diagnosis not present

## 2019-12-16 DIAGNOSIS — H25811 Combined forms of age-related cataract, right eye: Secondary | ICD-10-CM | POA: Diagnosis not present

## 2020-01-06 DIAGNOSIS — R7989 Other specified abnormal findings of blood chemistry: Secondary | ICD-10-CM | POA: Diagnosis not present

## 2020-01-06 DIAGNOSIS — E039 Hypothyroidism, unspecified: Secondary | ICD-10-CM | POA: Diagnosis not present

## 2020-01-07 ENCOUNTER — Ambulatory Visit (INDEPENDENT_AMBULATORY_CARE_PROVIDER_SITE_OTHER): Payer: 59 | Admitting: Family Medicine

## 2020-01-07 ENCOUNTER — Encounter (INDEPENDENT_AMBULATORY_CARE_PROVIDER_SITE_OTHER): Payer: Self-pay | Admitting: Family Medicine

## 2020-01-07 ENCOUNTER — Other Ambulatory Visit: Payer: Self-pay

## 2020-01-07 VITALS — BP 93/62 | HR 83 | Temp 98.4°F | Ht 63.0 in | Wt 204.0 lb

## 2020-01-07 DIAGNOSIS — E559 Vitamin D deficiency, unspecified: Secondary | ICD-10-CM | POA: Diagnosis not present

## 2020-01-07 DIAGNOSIS — Z6836 Body mass index (BMI) 36.0-36.9, adult: Secondary | ICD-10-CM | POA: Diagnosis not present

## 2020-01-07 DIAGNOSIS — F3289 Other specified depressive episodes: Secondary | ICD-10-CM

## 2020-01-07 DIAGNOSIS — I10 Essential (primary) hypertension: Secondary | ICD-10-CM | POA: Diagnosis not present

## 2020-01-07 DIAGNOSIS — Z9189 Other specified personal risk factors, not elsewhere classified: Secondary | ICD-10-CM

## 2020-01-07 MED ORDER — ESCITALOPRAM OXALATE 10 MG PO TABS: 10.0000 mg | ORAL_TABLET | ORAL | 0 refills | Status: DC

## 2020-01-07 MED ORDER — SAXENDA 18 MG/3ML ~~LOC~~ SOPN: 3.0000 mg | PEN_INJECTOR | Freq: Every day | SUBCUTANEOUS | 0 refills | Status: DC

## 2020-01-07 MED ORDER — VITAMIN D (ERGOCALCIFEROL) 1.25 MG (50000 UNIT) PO CAPS: 50000.0000 [IU] | ORAL_CAPSULE | ORAL | 0 refills | Status: DC

## 2020-01-07 NOTE — Progress Notes (Signed)
Chief Complaint:   OBESITY Katherine Smith is here to discuss her progress with her obesity treatment plan along with follow-up of her obesity related diagnoses. Katherine Smith is on the Category 3 Plan and states she is following her eating plan approximately 80% of the time. Katherine Smith states she is walking 30 minutes 3-4 times per week.  Today's visit was #: 66 Starting weight: 240 lbs Starting date: 08/10/2016 Today's weight: 204 lbs Today's date: 01/07/2020 Total lbs lost to date: 36 Total lbs lost since last in-office visit: 3  Interim History: Katherine Smith continues to do well with weight loss on her Category 3 plan. She increased her Saxenda to 3.0 mg and notes her hunger is better controlled but she may not always eat all of her protein.  Subjective:   1. Essential hypertension Kendy's blood pressure is well controlled with diet and weight loss. She notes some orthostatic symptoms suggesting she may be on too much anti-hypertensives.  2. Vitamin D deficiency Katherine Smith is stable on Vit D, and she denies nausea or vomiting.  3. Other depression, with emotional eating Katherine Smith is stable on Lexapro, and she is dealing with work stress well and avoiding emotional eating.  4. At risk for complication associated with hypotension The patient is at a higher than average risk of hypotension due to weight loss and low blood pressure.  Assessment/Plan:   1. Essential hypertension Katherine Smith agreed to discontinue lisinopril, and she will continue with diet, exercise, and healthy weight loss to improve blood pressure control. We will watch for signs of hypotension as she continues her lifestyle modifications.  2. Vitamin D deficiency Low Vitamin D level contributes to fatigue and are associated with obesity, breast, and colon cancer. We will refill prescription Vitamin D for 1 month. Katherine Smith will follow-up for routine testing of Vitamin D, at least 2-3 times per year to avoid over-replacement.  - Vitamin D,  Ergocalciferol, (DRISDOL) 1.25 MG (50000 UNIT) CAPS capsule; Take 1 capsule (50,000 Units total) by mouth every 7 (seven) days.  Dispense: 4 capsule; Refill: 0  3. Other depression, with emotional eating Behavior modification techniques were discussed today to help Katherine Smith deal with her emotional/non-hunger eating behaviors. We will refill Lexapro for 1 month. Orders and follow up as documented in patient record.   - escitalopram (LEXAPRO) 10 MG tablet; Take 1 tablet (10 mg total) by mouth every morning.  Dispense: 30 tablet; Refill: 0  4. At risk for complication associated with hypotension Katherine Smith was given approximately 15 minutes of education and counseling today to help avoid hypotension. We discussed risks of hypotension with weight loss and signs of hypotension such as feeling lightheaded or unsteady.  Repetitive spaced learning was employed today to elicit superior memory formation and behavioral change.  5. Class 2 severe obesity with serious comorbidity and body mass index (BMI) of 36.0 to 36.9 in adult, unspecified obesity type Katherine Smith is currently in the action stage of change. As such, her goal is to continue with weight loss efforts. She has agreed to the Category 3 Plan.   We discussed various medication options to help Katherine Smith with her weight loss efforts and we both agreed to continue Saxenda at 3.0 mg and we will refill for 1 month.  - Liraglutide -Weight Management (SAXENDA) 18 MG/3ML SOPN; Inject 0.5 mLs (3 mg total) into the skin daily.  Dispense: 15 mL; Refill: 0  Exercise goals: As is.  Behavioral modification strategies: increasing lean protein intake and no skipping meals.  Katherine Smith  has agreed to follow-up with our clinic in 3 to 4 weeks. She was informed of the importance of frequent follow-up visits to maximize her success with intensive lifestyle modifications for her multiple health conditions.   Objective:   Blood pressure 93/62, pulse 83, temperature 98.4 F  (36.9 C), temperature source Oral, height 5\' 3"  (1.6 m), weight 204 lb (92.5 kg), last menstrual period 04/14/2016, SpO2 97 %. Body mass index is 36.14 kg/m.  General: Cooperative, alert, well developed, in no acute distress. HEENT: Conjunctivae and lids unremarkable. Cardiovascular: Regular rhythm.  Lungs: Normal work of breathing. Neurologic: No focal deficits.   Lab Results  Component Value Date   CREATININE 0.87 08/05/2019   BUN 23 08/05/2019   NA 139 08/05/2019   K 4.7 08/05/2019   CL 103 08/05/2019   CO2 22 08/05/2019   Lab Results  Component Value Date   ALT 19 08/05/2019   AST 20 08/05/2019   ALKPHOS 74 08/05/2019   BILITOT 0.6 08/05/2019   Lab Results  Component Value Date   HGBA1C 5.2 08/05/2019   HGBA1C 5.1 11/23/2016   HGBA1C 5.3 08/10/2016   Lab Results  Component Value Date   INSULIN 16.3 08/05/2019   INSULIN 25.8 (H) 05/08/2019   INSULIN 46.2 (H) 10/02/2018   INSULIN 27.7 (H) 11/23/2016   INSULIN 38.9 (H) 08/10/2016   Lab Results  Component Value Date   TSH 0.011 (L) 11/23/2016   Lab Results  Component Value Date   CHOL 187 11/23/2016   HDL 66 11/23/2016   LDLCALC 101 (H) 11/23/2016   TRIG 101 11/23/2016   Lab Results  Component Value Date   WBC 8.8 01/07/2019   HGB 12.8 01/07/2019   HCT 38.9 01/07/2019   MCV 85.7 01/07/2019   PLT 334 01/07/2019   No results found for: IRON, TIBC, FERRITIN  Attestation Statements:   Reviewed by clinician on day of visit: allergies, medications, problem list, medical history, surgical history, family history, social history, and previous encounter notes.   I, Katherine Smith, am acting as transcriptionist for Katherine Nip, MD.  I have reviewed the above documentation for accuracy and completeness, and I agree with the above. -  Katherine Nip, MD

## 2020-01-08 DIAGNOSIS — F32A Depression, unspecified: Secondary | ICD-10-CM | POA: Insufficient documentation

## 2020-01-15 DIAGNOSIS — D225 Melanocytic nevi of trunk: Secondary | ICD-10-CM | POA: Diagnosis not present

## 2020-01-15 DIAGNOSIS — L538 Other specified erythematous conditions: Secondary | ICD-10-CM | POA: Diagnosis not present

## 2020-01-15 DIAGNOSIS — B078 Other viral warts: Secondary | ICD-10-CM | POA: Diagnosis not present

## 2020-01-15 DIAGNOSIS — D2262 Melanocytic nevi of left upper limb, including shoulder: Secondary | ICD-10-CM | POA: Diagnosis not present

## 2020-01-15 DIAGNOSIS — B372 Candidiasis of skin and nail: Secondary | ICD-10-CM | POA: Diagnosis not present

## 2020-01-15 DIAGNOSIS — D2261 Melanocytic nevi of right upper limb, including shoulder: Secondary | ICD-10-CM | POA: Diagnosis not present

## 2020-01-15 DIAGNOSIS — L900 Lichen sclerosus et atrophicus: Secondary | ICD-10-CM | POA: Diagnosis not present

## 2020-01-15 DIAGNOSIS — D2272 Melanocytic nevi of left lower limb, including hip: Secondary | ICD-10-CM | POA: Diagnosis not present

## 2020-01-15 DIAGNOSIS — Z85828 Personal history of other malignant neoplasm of skin: Secondary | ICD-10-CM | POA: Diagnosis not present

## 2020-02-04 ENCOUNTER — Other Ambulatory Visit: Payer: Self-pay

## 2020-02-04 ENCOUNTER — Encounter (INDEPENDENT_AMBULATORY_CARE_PROVIDER_SITE_OTHER): Payer: Self-pay | Admitting: Family Medicine

## 2020-02-04 ENCOUNTER — Ambulatory Visit (INDEPENDENT_AMBULATORY_CARE_PROVIDER_SITE_OTHER): Payer: 59 | Admitting: Family Medicine

## 2020-02-04 VITALS — BP 116/76 | HR 84 | Temp 98.1°F | Ht 63.0 in | Wt 204.0 lb

## 2020-02-04 DIAGNOSIS — E559 Vitamin D deficiency, unspecified: Secondary | ICD-10-CM

## 2020-02-04 DIAGNOSIS — Z6836 Body mass index (BMI) 36.0-36.9, adult: Secondary | ICD-10-CM | POA: Diagnosis not present

## 2020-02-04 DIAGNOSIS — Z9189 Other specified personal risk factors, not elsewhere classified: Secondary | ICD-10-CM | POA: Diagnosis not present

## 2020-02-04 DIAGNOSIS — F3289 Other specified depressive episodes: Secondary | ICD-10-CM | POA: Diagnosis not present

## 2020-02-04 MED ORDER — ESCITALOPRAM OXALATE 10 MG PO TABS: 10.0000 mg | ORAL_TABLET | ORAL | 0 refills | Status: DC

## 2020-02-04 MED ORDER — SAXENDA 18 MG/3ML ~~LOC~~ SOPN: 3.0000 mg | PEN_INJECTOR | Freq: Every day | SUBCUTANEOUS | 0 refills | Status: DC

## 2020-02-04 MED ORDER — VITAMIN D (ERGOCALCIFEROL) 1.25 MG (50000 UNIT) PO CAPS: 50000.0000 [IU] | ORAL_CAPSULE | ORAL | 0 refills | Status: DC

## 2020-02-05 NOTE — Progress Notes (Signed)
Chief Complaint:   OBESITY Katherine Smith is here to discuss her progress with her obesity treatment plan along with follow-up of her obesity related diagnoses. Katherine Smith is on the Category 3 Plan and states she is following her eating plan approximately 75% of the time. Katherine Smith states she is doing  0 minutes 0 times per week.  Today's visit was #: 82 Starting weight: 240 lbs Starting date: 08/10/2016 Today's weight: 204 lbs Today's date: 02/04/2020 Total lbs lost to date: 36 Total lbs lost since last in-office visit: 0  Interim History: Katherine Smith has done well maintaining her weight even on vacation. She has increased Saxenda to 3.0 mg and she feels thise dose helps a bit more.  Subjective:   1. Vitamin D deficiency Katherine Smith is stable on Vit D, and she denies nausea, vomiting, or muscle weakness.  2. Other depression, with emotional eating Deniesha's mood is stable on Lexapro. She is doing better controlling her emotional eating overall.  3. At risk for nausea Katherine Smith is at risk for nausea due to increased dose of Saxenda.  Assessment/Plan:   1. Vitamin D deficiency Low Vitamin D level contributes to fatigue and are associated with obesity, breast, and colon cancer. We will refill prescription Vitamin D for 1 month. Katherine Smith will follow-up for routine testing of Vitamin D, at least 2-3 times per year to avoid over-replacement.  - Vitamin D, Ergocalciferol, (DRISDOL) 1.25 MG (50000 UNIT) CAPS capsule; Take 1 capsule (50,000 Units total) by mouth every 7 (seven) days.  Dispense: 4 capsule; Refill: 0  2. Other depression, with emotional eating Behavior modification techniques were discussed today to help Katherine Smith deal with her emotional/non-hunger eating behaviors.  We will refill Lexapro for 1 month. Orders and follow up as documented in patient record.   - escitalopram (LEXAPRO) 10 MG tablet; Take 1 tablet (10 mg total) by mouth every morning.  Dispense: 30 tablet; Refill: 0  3. At risk for  nausea Katherine Smith was given approximately 15 minutes of nausea prevention counseling today. Katherine Smith is at risk for nausea due to her new or current medication. She was encouraged to titrate her medication slowly, make sure to stay hydrated, eat smaller portions throughout the day, and avoid high fat meals.   4. Class 2 severe obesity with serious comorbidity and body mass index (BMI) of 36.0 to 36.9 in adult, unspecified obesity type [facility]) Katherine Smith is currently in the action stage of change. As such, her goal is to continue with weight loss efforts. She has agreed to the Category 3 Plan.   We discussed various medication options to help Katherine Smith with her weight loss efforts and we both agreed to continue Saxenda at 3.0 mg daily and we will refill for 1 month.  - Liraglutide -Weight Management (SAXENDA) 18 MG/3ML SOPN; Inject 0.5 mLs (3 mg total) into the skin daily.  Dispense: 5 pen; Refill: 0  Behavioral modification strategies: meal planning and cooking strategies and emotional eating strategies.  Katherine Smith has agreed to follow-up with our clinic in 2 to 3 weeks. She was informed of the importance of frequent follow-up visits to maximize her success with intensive lifestyle modifications for her multiple health conditions.   Objective:   Blood pressure 116/76, pulse 84, temperature 98.1 F (36.7 C), temperature source Oral, height 5\' 3"  (1.6 m), weight 204 lb (92.5 kg), last menstrual period 04/14/2016, SpO2 98 %. Body mass index is 36.14 kg/m.  General: Cooperative, alert, well developed, in no acute distress. HEENT: Conjunctivae  and lids unremarkable. Cardiovascular: Regular rhythm.  Lungs: Normal work of breathing. Neurologic: No focal deficits.   Lab Results  Component Value Date   CREATININE 0.87 08/05/2019   BUN 23 08/05/2019   NA 139 08/05/2019   K 4.7 08/05/2019   CL 103 08/05/2019   CO2 22 08/05/2019   Lab Results  Component Value Date   ALT 19 08/05/2019   AST 20  08/05/2019   ALKPHOS 74 08/05/2019   BILITOT 0.6 08/05/2019   Lab Results  Component Value Date   HGBA1C 5.2 08/05/2019   HGBA1C 5.1 11/23/2016   HGBA1C 5.3 08/10/2016   Lab Results  Component Value Date   INSULIN 16.3 08/05/2019   INSULIN 25.8 (H) 05/08/2019   INSULIN 46.2 (H) 10/02/2018   INSULIN 27.7 (H) 11/23/2016   INSULIN 38.9 (H) 08/10/2016   Lab Results  Component Value Date   TSH 0.011 (L) 11/23/2016   Lab Results  Component Value Date   CHOL 187 11/23/2016   HDL 66 11/23/2016   LDLCALC 101 (H) 11/23/2016   TRIG 101 11/23/2016   Lab Results  Component Value Date   WBC 8.8 01/07/2019   HGB 12.8 01/07/2019   HCT 38.9 01/07/2019   MCV 85.7 01/07/2019   PLT 334 01/07/2019   No results found for: IRON, TIBC, FERRITIN  Attestation Statements:   Reviewed by clinician on day of visit: allergies, medications, problem list, medical history, surgical history, family history, social history, and previous encounter notes.   I, Trixie Dredge, am acting as transcriptionist for Dennard Nip, MD.  I have reviewed the above documentation for accuracy and completeness, and I agree with the above. -  Dennard Nip, MD

## 2020-02-10 DIAGNOSIS — I10 Essential (primary) hypertension: Secondary | ICD-10-CM | POA: Diagnosis not present

## 2020-02-10 DIAGNOSIS — G4733 Obstructive sleep apnea (adult) (pediatric): Secondary | ICD-10-CM | POA: Diagnosis not present

## 2020-02-25 ENCOUNTER — Other Ambulatory Visit: Payer: Self-pay

## 2020-02-25 ENCOUNTER — Ambulatory Visit (INDEPENDENT_AMBULATORY_CARE_PROVIDER_SITE_OTHER): Payer: 59 | Admitting: Family Medicine

## 2020-02-25 ENCOUNTER — Encounter (INDEPENDENT_AMBULATORY_CARE_PROVIDER_SITE_OTHER): Payer: Self-pay | Admitting: Family Medicine

## 2020-02-25 VITALS — BP 109/68 | HR 87 | Temp 98.0°F | Ht 63.0 in | Wt 202.0 lb

## 2020-02-25 DIAGNOSIS — E7849 Other hyperlipidemia: Secondary | ICD-10-CM

## 2020-02-25 DIAGNOSIS — Z9189 Other specified personal risk factors, not elsewhere classified: Secondary | ICD-10-CM

## 2020-02-25 DIAGNOSIS — Z6835 Body mass index (BMI) 35.0-35.9, adult: Secondary | ICD-10-CM

## 2020-02-25 DIAGNOSIS — F3289 Other specified depressive episodes: Secondary | ICD-10-CM | POA: Diagnosis not present

## 2020-02-25 DIAGNOSIS — E559 Vitamin D deficiency, unspecified: Secondary | ICD-10-CM | POA: Diagnosis not present

## 2020-02-25 MED ORDER — ESCITALOPRAM OXALATE 10 MG PO TABS: 10.0000 mg | ORAL_TABLET | ORAL | 0 refills | Status: DC

## 2020-02-25 MED ORDER — VITAMIN D (ERGOCALCIFEROL) 1.25 MG (50000 UNIT) PO CAPS: 50000.0000 [IU] | ORAL_CAPSULE | ORAL | 0 refills | Status: DC

## 2020-02-26 LAB — COMPREHENSIVE METABOLIC PANEL
ALT: 20 IU/L (ref 0–32)
AST: 23 IU/L (ref 0–40)
Albumin/Globulin Ratio: 1.5 (ref 1.2–2.2)
Albumin: 4.3 g/dL (ref 3.8–4.9)
Alkaline Phosphatase: 91 IU/L (ref 48–121)
BUN/Creatinine Ratio: 26 (ref 12–28)
BUN: 22 mg/dL (ref 8–27)
Bilirubin Total: 0.4 mg/dL (ref 0.0–1.2)
CO2: 21 mmol/L (ref 20–29)
Calcium: 9.7 mg/dL (ref 8.7–10.3)
Chloride: 103 mmol/L (ref 96–106)
Creatinine, Ser: 0.86 mg/dL (ref 0.57–1.00)
GFR calc Af Amer: 85 mL/min/{1.73_m2} (ref >59)
GFR calc non Af Amer: 74 mL/min/{1.73_m2} (ref >59)
Globulin, Total: 2.9 g/dL (ref 1.5–4.5)
Glucose: 91 mg/dL (ref 65–99)
Potassium: 4.2 mmol/L (ref 3.5–5.2)
Sodium: 140 mmol/L (ref 134–144)
Total Protein: 7.2 g/dL (ref 6.0–8.5)

## 2020-02-26 LAB — HEMOGLOBIN A1C
Est. average glucose Bld gHb Est-mCnc: 94 mg/dL
Hgb A1c MFr Bld: 4.9 % (ref 4.8–5.6)

## 2020-02-26 LAB — INSULIN, RANDOM: INSULIN: 19.3 u[IU]/mL (ref 2.6–24.9)

## 2020-02-26 LAB — VITAMIN D 25 HYDROXY (VIT D DEFICIENCY, FRACTURES): Vit D, 25-Hydroxy: 40.1 ng/mL (ref 30.0–100.0)

## 2020-02-26 MED ORDER — SAXENDA 18 MG/3ML ~~LOC~~ SOPN: 3.0000 mg | PEN_INJECTOR | Freq: Every day | SUBCUTANEOUS | 0 refills | Status: DC

## 2020-02-26 NOTE — Progress Notes (Signed)
Chief Complaint:   OBESITY Katherine Smith is here to discuss her progress with her obesity treatment plan along with follow-up of her obesity related diagnoses. Katherine Smith is on the Category 3 Plan and states she is following her eating plan approximately 90% of the time. Katherine Smith states she is walking for 30 minutes 2 times per week.  Today's visit was #: 91 Starting weight: 240 lbs Starting date: 08/10/2016 Today's weight: 202 lbs Today's date: 02/25/2020 Total lbs lost to date: 38 Total lbs lost since last in-office visit: 2  Interim History: Katherine Smith continues to do well with weight loss even while on vacation. Her hunger is controlled and when she deviates she gets right back on track.  Subjective:   1. Vitamin D deficiency Katherine Smith is stable on Vit D, and she is due for labs.  2. Other hyperlipidemia Katherine Smith is working on diet and exercise, and she is Zetia and she is due for labs.  3. Other depression with emotional eating Katherine Smith's mood is stable and she is doing well decreasing emotional eating.  4. At risk for heart disease Katherine Smith is at a higher than average risk for cardiovascular disease due to obesity.   Assessment/Plan:   1. Vitamin D deficiency Low Vitamin D level contributes to fatigue and are associated with obesity, breast, and colon cancer. We will check labs today, and we will refill prescription Vitamin D for 1 month. Katherine Smith will follow-up for routine testing of Vitamin D, at least 2-3 times per year to avoid over-replacement.  - VITAMIN D 25 Hydroxy (Vit-D Deficiency, Fractures) - Vitamin D, Ergocalciferol, (DRISDOL) 1.25 MG (50000 UNIT) CAPS capsule; Take 1 capsule (50,000 Units total) by mouth every 7 (seven) days.  Dispense: 4 capsule; Refill: 0  2. Other hyperlipidemia Cardiovascular risk and specific lipid/LDL goals reviewed. We discussed several lifestyle modifications today and Katherine Smith will continue to work on diet, exercise and weight loss efforts. We will check  labs today. Orders and follow up as documented in patient record.   - Comprehensive metabolic panel - Hemoglobin A1c - Insulin, random  3. Other depression with emotional eating Behavior modification techniques were discussed today to help Katherine Smith deal with her emotional/non-hunger eating behaviors. We will refill Lexapro for 1 month. Orders and follow up as documented in patient record.   - escitalopram (LEXAPRO) 10 MG tablet; Take 1 tablet (10 mg total) by mouth every morning.  Dispense: 30 tablet; Refill: 0  4. At risk for heart disease Katherine Smith was given approximately 15 minutes of coronary artery disease prevention counseling today. She is 59 y.o. female and has risk factors for heart disease including obesity. We discussed intensive lifestyle modifications today with an emphasis on specific weight loss instructions and strategies.   Repetitive spaced learning was employed today to elicit superior memory formation and behavioral change.  5. Class 2 severe obesity with serious comorbidity and body mass index (BMI) of 35.0 to 35.9 in adult, unspecified obesity type [facility]) Katherine Smith is currently in the action stage of change. As such, her goal is to continue with weight loss efforts. She has agreed to the Category 3 Plan.   We discussed various medication options to help Katherine Smith with her weight loss efforts and we both agreed to continue Saxenda 3 mg SubQ daily #5 pens and we will refill for 1 month.  Exercise goals: As is.  Behavioral modification strategies: increasing lean protein intake and decreasing simple carbohydrates.  Katherine Smith has agreed to follow-up with our clinic in 3  weeks. She was informed of the importance of frequent follow-up visits to maximize her success with intensive lifestyle modifications for her multiple health conditions.   Katherine Smith was informed we would discuss her lab results at her next visit unless there is a critical issue that needs to be addressed sooner. Katherine Smith  agreed to keep her next visit at the agreed upon time to discuss these results.  Objective:   Blood pressure 109/68, pulse 87, temperature 98 F (36.7 C), temperature source Oral, height 5\' 3"  (1.6 m), weight 202 lb (91.6 kg), last menstrual period 04/14/2016, SpO2 97 %. Body mass index is 35.78 kg/m.  General: Cooperative, alert, well developed, in no acute distress. HEENT: Conjunctivae and lids unremarkable. Cardiovascular: Regular rhythm.  Lungs: Normal work of breathing. Neurologic: No focal deficits.   Lab Results  Component Value Date   CREATININE 0.86 02/25/2020   BUN 22 02/25/2020   NA 140 02/25/2020   K 4.2 02/25/2020   CL 103 02/25/2020   CO2 21 02/25/2020   Lab Results  Component Value Date   ALT 20 02/25/2020   AST 23 02/25/2020   ALKPHOS 91 02/25/2020   BILITOT 0.4 02/25/2020   Lab Results  Component Value Date   HGBA1C 4.9 02/25/2020   HGBA1C 5.2 08/05/2019   HGBA1C 5.1 11/23/2016   HGBA1C 5.3 08/10/2016   Lab Results  Component Value Date   INSULIN 19.3 02/25/2020   INSULIN 16.3 08/05/2019   INSULIN 25.8 (H) 05/08/2019   INSULIN 46.2 (H) 10/02/2018   INSULIN 27.7 (H) 11/23/2016   Lab Results  Component Value Date   TSH 0.011 (L) 11/23/2016   Lab Results  Component Value Date   CHOL 187 11/23/2016   HDL 66 11/23/2016   LDLCALC 101 (H) 11/23/2016   TRIG 101 11/23/2016   Lab Results  Component Value Date   WBC 8.8 01/07/2019   HGB 12.8 01/07/2019   HCT 38.9 01/07/2019   MCV 85.7 01/07/2019   PLT 334 01/07/2019   No results found for: IRON, TIBC, FERRITIN  Attestation Statements:   Reviewed by clinician on day of visit: allergies, medications, problem list, medical history, surgical history, family history, social history, and previous encounter notes.   I, Trixie Dredge, am acting as transcriptionist for Dennard Nip, MD.  I have reviewed the above documentation for accuracy and completeness, and I agree with the above. -  Dennard Nip, MD

## 2020-03-02 DIAGNOSIS — E039 Hypothyroidism, unspecified: Secondary | ICD-10-CM | POA: Diagnosis not present

## 2020-03-02 DIAGNOSIS — R7989 Other specified abnormal findings of blood chemistry: Secondary | ICD-10-CM | POA: Diagnosis not present

## 2020-03-03 ENCOUNTER — Other Ambulatory Visit: Payer: Self-pay | Admitting: Infectious Diseases

## 2020-03-17 ENCOUNTER — Encounter (INDEPENDENT_AMBULATORY_CARE_PROVIDER_SITE_OTHER): Payer: Self-pay | Admitting: Family Medicine

## 2020-03-17 ENCOUNTER — Ambulatory Visit (INDEPENDENT_AMBULATORY_CARE_PROVIDER_SITE_OTHER): Payer: 59 | Admitting: Family Medicine

## 2020-03-17 ENCOUNTER — Other Ambulatory Visit: Payer: Self-pay

## 2020-03-17 VITALS — BP 118/80 | HR 77 | Temp 97.9°F | Ht 63.0 in | Wt 200.0 lb

## 2020-03-17 DIAGNOSIS — Z6835 Body mass index (BMI) 35.0-35.9, adult: Secondary | ICD-10-CM

## 2020-03-17 DIAGNOSIS — Z20822 Contact with and (suspected) exposure to covid-19: Secondary | ICD-10-CM | POA: Diagnosis not present

## 2020-03-17 DIAGNOSIS — Z1152 Encounter for screening for COVID-19: Secondary | ICD-10-CM | POA: Diagnosis not present

## 2020-03-17 DIAGNOSIS — E8881 Metabolic syndrome: Secondary | ICD-10-CM | POA: Diagnosis not present

## 2020-03-17 DIAGNOSIS — Z03818 Encounter for observation for suspected exposure to other biological agents ruled out: Secondary | ICD-10-CM | POA: Diagnosis not present

## 2020-03-18 NOTE — Progress Notes (Signed)
Chief Complaint:   OBESITY Katherine Smith is here to discuss her progress with her obesity treatment plan along with follow-up of her obesity related diagnoses. Katherine Smith is on the Category 3 Plan and states she is following her eating plan approximately 90% of the time. Katherine Smith states she is walking for 30 minutes 3 times per week.  Today's visit was #: 74 Starting weight: 240 lbs Starting date: 08/10/2016 Today's weight: 200 lbs Today's date: 03/17/2020 Total lbs lost to date: 40 Total lbs lost since last in-office visit: 2  Interim History: Freedom continues to do well with weight loss. She is stable on Saxenda at 3.0 mg and she denies nausea or vomiting. She is working on increasing protein and would like to change to a journaling plan.  Subjective:   1. Insulin resistance Katherine Smith's insulin resistance is well controlled with diet and weight loss. She is stable on her GLP-1 and she denies nausea or vomiting. I discussed labs with the patient today.  Assessment/Plan:   1. Insulin resistance Katherine Smith will continue to work on weight loss, exercise, and decreasing simple carbohydrates to help decrease the risk of diabetes. Katherine Smith agreed to follow-up with Katherine Smith as directed to closely monitor her progress.  2. Class 2 severe obesity with serious comorbidity and body mass index (BMI) of 35.0 to 35.9 in adult, unspecified obesity type [facility]) Brandis is currently in the action stage of change. As such, her goal is to continue with weight loss efforts. She has agreed to the Category 3 Plan or keeping a food journal and adhering to recommended goals of 1300-1500 calories and 80+ grams of protein daily.   Exercise goals: As is.  Behavioral modification strategies: increasing lean protein intake and increasing water intake.  Katherine Smith has agreed to follow-up with our clinic in 3 weeks. She was informed of the importance of frequent follow-up visits to maximize her success with intensive lifestyle modifications  for her multiple health conditions.   Objective:   Blood pressure 118/80, pulse 77, temperature 97.9 F (36.6 C), temperature source Oral, height 5\' 3"  (1.6 m), weight 200 lb (90.7 kg), last menstrual period 04/14/2016, SpO2 100 %. Body mass index is 35.43 kg/m.  General: Cooperative, alert, well developed, in no acute distress. HEENT: Conjunctivae and lids unremarkable. Cardiovascular: Regular rhythm.  Lungs: Normal work of breathing. Neurologic: No focal deficits.   Lab Results  Component Value Date   CREATININE 0.86 02/25/2020   BUN 22 02/25/2020   NA 140 02/25/2020   K 4.2 02/25/2020   CL 103 02/25/2020   CO2 21 02/25/2020   Lab Results  Component Value Date   ALT 20 02/25/2020   AST 23 02/25/2020   ALKPHOS 91 02/25/2020   BILITOT 0.4 02/25/2020   Lab Results  Component Value Date   HGBA1C 4.9 02/25/2020   HGBA1C 5.2 08/05/2019   HGBA1C 5.1 11/23/2016   HGBA1C 5.3 08/10/2016   Lab Results  Component Value Date   INSULIN 19.3 02/25/2020   INSULIN 16.3 08/05/2019   INSULIN 25.8 (H) 05/08/2019   INSULIN 46.2 (H) 10/02/2018   INSULIN 27.7 (H) 11/23/2016   Lab Results  Component Value Date   TSH 0.011 (L) 11/23/2016   Lab Results  Component Value Date   CHOL 187 11/23/2016   HDL 66 11/23/2016   LDLCALC 101 (H) 11/23/2016   TRIG 101 11/23/2016   Lab Results  Component Value Date   WBC 8.8 01/07/2019   HGB 12.8 01/07/2019   HCT 38.9  01/07/2019   MCV 85.7 01/07/2019   PLT 334 01/07/2019   No results found for: IRON, TIBC, FERRITIN  Attestation Statements:   Reviewed by clinician on day of visit: allergies, medications, problem list, medical history, surgical history, family history, social history, and previous encounter notes.  Time spent on visit including pre-visit chart review and post-visit care and charting was 30 minutes.    I, Trixie Dredge, am acting as transcriptionist for Dennard Nip, MD.  I have reviewed the above documentation for  accuracy and completeness, and I agree with the above. -  Dennard Nip, MD

## 2020-04-06 ENCOUNTER — Other Ambulatory Visit: Payer: Self-pay

## 2020-04-06 ENCOUNTER — Encounter (INDEPENDENT_AMBULATORY_CARE_PROVIDER_SITE_OTHER): Payer: Self-pay | Admitting: Family Medicine

## 2020-04-06 ENCOUNTER — Other Ambulatory Visit (INDEPENDENT_AMBULATORY_CARE_PROVIDER_SITE_OTHER): Payer: Self-pay | Admitting: Family Medicine

## 2020-04-06 ENCOUNTER — Ambulatory Visit (INDEPENDENT_AMBULATORY_CARE_PROVIDER_SITE_OTHER): Payer: 59 | Admitting: Family Medicine

## 2020-04-06 VITALS — BP 111/73 | HR 76 | Temp 97.9°F | Ht 63.0 in | Wt 199.0 lb

## 2020-04-06 DIAGNOSIS — E559 Vitamin D deficiency, unspecified: Secondary | ICD-10-CM

## 2020-04-06 DIAGNOSIS — Z9189 Other specified personal risk factors, not elsewhere classified: Secondary | ICD-10-CM

## 2020-04-06 DIAGNOSIS — F3289 Other specified depressive episodes: Secondary | ICD-10-CM | POA: Diagnosis not present

## 2020-04-06 DIAGNOSIS — Z6835 Body mass index (BMI) 35.0-35.9, adult: Secondary | ICD-10-CM | POA: Diagnosis not present

## 2020-04-06 MED ORDER — VITAMIN D (ERGOCALCIFEROL) 1.25 MG (50000 UNIT) PO CAPS: 50000.0000 [IU] | ORAL_CAPSULE | ORAL | 0 refills | Status: DC

## 2020-04-06 MED ORDER — SAXENDA 18 MG/3ML ~~LOC~~ SOPN: 3.0000 mg | PEN_INJECTOR | Freq: Every day | SUBCUTANEOUS | 0 refills | Status: DC

## 2020-04-06 MED ORDER — ESCITALOPRAM OXALATE 20 MG PO TABS: 20.0000 mg | ORAL_TABLET | ORAL | 0 refills | Status: DC

## 2020-04-06 NOTE — Progress Notes (Signed)
Chief Complaint:   OBESITY Katherine Smith is here to discuss her progress with her obesity treatment plan along with follow-up of her obesity related diagnoses. Katherine Smith is on the Category 3 Plan or keeping a food journal and adhering to recommended goals of 1300-1500 calories and 80+ grams of protein daily and states she is following her eating plan approximately 50% of the time. Shakea states she is walking for 30 minutes 3 times per week.  Today's visit was #: 59 Starting weight: 240 lbs Starting date: 08/10/2016 Today's weight: 199 lbs Today's date: 04/06/2020 Total lbs lost to date: 41 Total lbs lost since last in-office visit: 1  Interim History: Katherine Smith continues to do well with weight loss. She is mindful of her food choices and she is making better choices while journaling on and off. She is tolerating Saxenda well, and she denies nausea or vomiting.  Subjective:   1. Vitamin D deficiency Katherine Smith is stable on Vit D, and she denies nausea or vomiting. Her Vit D level is now at goal.  2. Other depression with emotional eating Katherine Smith notes some increased stress and comfort eating in the last month, with increased work stress and family health issues.  3. At risk for dehydration Katherine Smith is at risk for dehydration due to decreased water intake.  Assessment/Plan:   1. Vitamin D deficiency Low Vitamin D level contributes to fatigue and are associated with obesity, breast, and colon cancer. We will refill prescription Vitamin D for 1 month. Etheline will follow-up for routine testing of Vitamin D, at least 2-3 times per year to avoid over-replacement.  - Vitamin D, Ergocalciferol, (DRISDOL) 1.25 MG (50000 UNIT) CAPS capsule; Take 1 capsule (50,000 Units total) by mouth every 7 (seven) days.  Dispense: 4 capsule; Refill: 0  2. Other depression with emotional eating Behavior modification techniques were discussed today to help [name] deal with her emotional/non-hunger eating behaviors.  [name] agreed to increase Lexapro to 20 mg  q daily #30 with no refills. Orders and follow up as documented in patient record.   - escitalopram (LEXAPRO) 20 MG tablet; Take 1 tablet (20 mg total) by mouth every morning.  Dispense: 30 tablet; Refill: 0  3. At risk for dehydration Katherine Smith was given approximately 15 minutes dehydration prevention counseling today. Katherine Smith is at risk for dehydration due to weight loss and current medication(s). She was encouraged to hydrate and monitor fluid status to avoid dehydration as well as weight loss plateaus.   4. Class 2 severe obesity with serious comorbidity and body mass index (BMI) of 35.0 to 35.9 in adult, unspecified obesity type [facility]) Katherine Smith is currently in the action stage of change. As such, her goal is to continue with weight loss efforts. She has agreed to the Category 3 Plan or keeping a food journal and adhering to recommended goals of 1600 calories and 90 grams of protein daily.   We discussed various medication options to help Katherine Smith with her weight loss efforts and we both agreed to continue Saxenda 3 mg SubQ daily and we will refill for 1 month.  Exercise goals: As is.  Behavioral modification strategies: increasing lean protein intake and increasing water intake.  Katherine Smith has agreed to follow-up with our clinic in 4 weeks. She was informed of the importance of frequent follow-up visits to maximize her success with intensive lifestyle modifications for her multiple health conditions.   Objective:   Blood pressure 111/73, pulse 76, temperature 97.9 F (36.6 C), temperature source Oral, height  5\' 3"  (1.6 m), weight 199 lb (90.3 kg), last menstrual period 04/14/2016, SpO2 97 %. Body mass index is 35.25 kg/m.  General: Cooperative, alert, well developed, in no acute distress. HEENT: Conjunctivae and lids unremarkable. Cardiovascular: Regular rhythm.  Lungs: Normal work of breathing. Neurologic: No focal deficits.   Lab Results    Component Value Date   CREATININE 0.86 02/25/2020   BUN 22 02/25/2020   NA 140 02/25/2020   K 4.2 02/25/2020   CL 103 02/25/2020   CO2 21 02/25/2020   Lab Results  Component Value Date   ALT 20 02/25/2020   AST 23 02/25/2020   ALKPHOS 91 02/25/2020   BILITOT 0.4 02/25/2020   Lab Results  Component Value Date   HGBA1C 4.9 02/25/2020   HGBA1C 5.2 08/05/2019   HGBA1C 5.1 11/23/2016   HGBA1C 5.3 08/10/2016   Lab Results  Component Value Date   INSULIN 19.3 02/25/2020   INSULIN 16.3 08/05/2019   INSULIN 25.8 (H) 05/08/2019   INSULIN 46.2 (H) 10/02/2018   INSULIN 27.7 (H) 11/23/2016   Lab Results  Component Value Date   TSH 0.011 (L) 11/23/2016   Lab Results  Component Value Date   CHOL 187 11/23/2016   HDL 66 11/23/2016   LDLCALC 101 (H) 11/23/2016   TRIG 101 11/23/2016   Lab Results  Component Value Date   WBC 8.8 01/07/2019   HGB 12.8 01/07/2019   HCT 38.9 01/07/2019   MCV 85.7 01/07/2019   PLT 334 01/07/2019   No results found for: IRON, TIBC, FERRITIN  Attestation Statements:   Reviewed by clinician on day of visit: allergies, medications, problem list, medical history, surgical history, family history, social history, and previous encounter notes.   I, Trixie Dredge, am acting as transcriptionist for Dennard Nip, MD.  I have reviewed the above documentation for accuracy and completeness, and I agree with the above. -  Dennard Nip, MD

## 2020-04-08 DIAGNOSIS — E039 Hypothyroidism, unspecified: Secondary | ICD-10-CM | POA: Diagnosis not present

## 2020-04-08 DIAGNOSIS — I10 Essential (primary) hypertension: Secondary | ICD-10-CM | POA: Diagnosis not present

## 2020-04-08 DIAGNOSIS — R7989 Other specified abnormal findings of blood chemistry: Secondary | ICD-10-CM | POA: Diagnosis not present

## 2020-04-08 DIAGNOSIS — Z1159 Encounter for screening for other viral diseases: Secondary | ICD-10-CM | POA: Diagnosis not present

## 2020-04-08 DIAGNOSIS — E782 Mixed hyperlipidemia: Secondary | ICD-10-CM | POA: Diagnosis not present

## 2020-04-13 ENCOUNTER — Ambulatory Visit: Payer: 59 | Attending: Internal Medicine

## 2020-04-13 DIAGNOSIS — Z23 Encounter for immunization: Secondary | ICD-10-CM

## 2020-04-13 NOTE — Progress Notes (Signed)
   Covid-19 Vaccination Clinic  Name:  LILIAS LORENSEN    MRN: [id number] DOB: 06-05-1961  04/13/2020  Ms. Sebald was observed post Covid-19 immunization for 15 minutes without incident. She was provided with Vaccine Information Sheet and instruction to access the V-Safe system.   Ms. Menard was instructed to call 911 with any severe reactions post vaccine: Marland Kitchen Difficulty breathing  . Swelling of face and throat  . A fast heartbeat  . A bad rash all over body  . Dizziness and weakness

## 2020-04-27 DIAGNOSIS — E039 Hypothyroidism, unspecified: Secondary | ICD-10-CM | POA: Diagnosis not present

## 2020-04-27 DIAGNOSIS — E782 Mixed hyperlipidemia: Secondary | ICD-10-CM | POA: Diagnosis not present

## 2020-04-27 DIAGNOSIS — Z8679 Personal history of other diseases of the circulatory system: Secondary | ICD-10-CM | POA: Diagnosis not present

## 2020-05-04 ENCOUNTER — Ambulatory Visit (INDEPENDENT_AMBULATORY_CARE_PROVIDER_SITE_OTHER): Payer: 59 | Admitting: Family Medicine

## 2020-05-04 ENCOUNTER — Other Ambulatory Visit: Payer: Self-pay

## 2020-05-04 ENCOUNTER — Encounter (INDEPENDENT_AMBULATORY_CARE_PROVIDER_SITE_OTHER): Payer: Self-pay | Admitting: Family Medicine

## 2020-05-04 VITALS — BP 101/69 | HR 84 | Temp 97.8°F | Ht 63.0 in | Wt 195.0 lb

## 2020-05-04 DIAGNOSIS — Z9189 Other specified personal risk factors, not elsewhere classified: Secondary | ICD-10-CM | POA: Diagnosis not present

## 2020-05-04 DIAGNOSIS — F3289 Other specified depressive episodes: Secondary | ICD-10-CM | POA: Diagnosis not present

## 2020-05-04 DIAGNOSIS — E559 Vitamin D deficiency, unspecified: Secondary | ICD-10-CM

## 2020-05-04 DIAGNOSIS — E669 Obesity, unspecified: Secondary | ICD-10-CM | POA: Diagnosis not present

## 2020-05-04 DIAGNOSIS — Z6834 Body mass index (BMI) 34.0-34.9, adult: Secondary | ICD-10-CM | POA: Diagnosis not present

## 2020-05-04 MED ORDER — BD PEN NEEDLE NANO 2ND GEN 32G X 4 MM MISC: 0 refills | Status: DC

## 2020-05-04 MED ORDER — ESCITALOPRAM OXALATE 20 MG PO TABS: 20.0000 mg | ORAL_TABLET | ORAL | 0 refills | Status: DC

## 2020-05-04 MED ORDER — SAXENDA 18 MG/3ML ~~LOC~~ SOPN: 3.0000 mg | PEN_INJECTOR | Freq: Every day | SUBCUTANEOUS | 0 refills | Status: DC

## 2020-05-04 MED ORDER — VITAMIN D (ERGOCALCIFEROL) 1.25 MG (50000 UNIT) PO CAPS: 50000.0000 [IU] | ORAL_CAPSULE | ORAL | 0 refills | Status: DC

## 2020-05-05 NOTE — Progress Notes (Signed)
Chief Complaint:   OBESITY Katherine Smith is here to discuss her progress with her obesity treatment plan along with follow-up of her obesity related diagnoses. Katherine Smith is on the Category 3 Plan or keeping a food journal and adhering to recommended goals of 1600 calories and 90 grams of protein daily and states she is following her eating plan approximately 80% of the time. Katherine Smith states she is walking and golfing for 30 minutes 3 times per week.  Today's visit was #: 74 Starting weight: 240 lbs Starting date: [id number] Today's weight: 195 lbs Today's date: 05/04/2020 Total lbs lost to date: 45 Total lbs lost since last in-office visit: 4  Interim History: Katherine Smith continues to do well with weight loss on her plan. She isn't always getting the best support from her husband especially with dinner and planning and prepping.  Subjective:   1. Vitamin D deficiency Katherine Smith is stable on Vit D, and she denies nausea or vomiting. She requests a refill today.  2. Other depression with emotional eating Katherine Smith's mood is stable on Lexapro. She is doing well avoiding emotional eating behaviors.   3. At risk for osteoporosis Katherine Smith is at higher risk of osteopenia and osteoporosis due to Vitamin D deficiency.   Assessment/Plan:   1. Vitamin D deficiency Low Vitamin D level contributes to fatigue and are associated with obesity, breast, and colon cancer. We will refill prescription Vitamin D for 1 month. Katherine Smith will follow-up for routine testing of Vitamin D, at least 2-3 times per year to avoid over-replacement.  - Vitamin D, Ergocalciferol, (DRISDOL) 1.25 MG (50000 UNIT) CAPS capsule; Take 1 capsule (50,000 Units total) by mouth every 7 (seven) days.  Dispense: 4 capsule; Refill: 0  2. Other depression with emotional eating Behavior modification techniques were discussed today to help Katherine Smith deal with her emotional/non-hunger eating behaviors. We will refill Lexapro for 1 month. Orders and follow up as  documented in patient record.   - escitalopram (LEXAPRO) 20 MG tablet; Take 1 tablet (20 mg total) by mouth every morning.  Dispense: 30 tablet; Refill: 0  3. At risk for osteoporosis Katherine Smith was given approximately 15 minutes of osteoporosis prevention counseling today. Katherine Smith is at risk for osteopenia and osteoporosis due to her Vitamin D deficiency. She was encouraged to take her Vitamin D and follow her higher calcium diet and increase strengthening exercise to help strengthen her bones and decrease her risk of osteopenia and osteoporosis.  Repetitive spaced learning was employed today to elicit superior memory formation and behavioral change.  4. Class 1 obesity with serious comorbidity and body mass index (BMI) of 34.0 to 34.9 in adult, unspecified obesity type Katherine Smith is currently in the action stage of change. As such, her goal is to continue with weight loss efforts. She has agreed to keeping a food journal and adhering to recommended goals of 1300-1500 calories and 80+ grams of protein daily.   We discussed various medication options to help Katherine Smith with her weight loss efforts and we both agreed to continue Saxenda 3.0 mg and we will refill, and refill pen needles #100 with no refill.  - Insulin Pen Needle (BD PEN NEEDLE NANO 2ND GEN) 32G X 4 MM MISC; USE 2 TIMES DAILY.  Dispense: 100 each; Refill: 0 - Liraglutide -Weight Management (SAXENDA) 18 MG/3ML SOPN; Inject 3 mg into the skin daily.  Dispense: 15 mL; Refill: 0  Exercise goals: As is.  Behavioral modification strategies: increasing lean protein intake and meal planning and cooking  strategies.  Katherine Smith has agreed to follow-up with our clinic in 3 to 4 weeks. She was informed of the importance of frequent follow-up visits to maximize her success with intensive lifestyle modifications for her multiple health conditions.   Objective:   Blood pressure 101/69, pulse 84, temperature 97.8 F (36.6 C), height 5\' 3"  (1.6 m), weight 195  lb (88.5 kg), last menstrual period 04/14/2016, SpO2 98 %. Body mass index is 34.54 kg/m.  General: Cooperative, alert, well developed, in no acute distress. HEENT: Conjunctivae and lids unremarkable. Cardiovascular: Regular rhythm.  Lungs: Normal work of breathing. Neurologic: No focal deficits.   Lab Results  Component Value Date   CREATININE 0.86 02/25/2020   BUN 22 02/25/2020   NA 140 02/25/2020   K 4.2 02/25/2020   CL 103 02/25/2020   CO2 21 02/25/2020   Lab Results  Component Value Date   ALT 20 02/25/2020   AST 23 02/25/2020   ALKPHOS 91 02/25/2020   BILITOT 0.4 02/25/2020   Lab Results  Component Value Date   HGBA1C 4.9 02/25/2020   HGBA1C 5.2 08/05/2019   HGBA1C 5.1 11/23/2016   HGBA1C 5.3 08/10/2016   Lab Results  Component Value Date   INSULIN 19.3 02/25/2020   INSULIN 16.3 08/05/2019   INSULIN 25.8 (H) 05/08/2019   INSULIN 46.2 (H) 10/02/2018   INSULIN 27.7 (H) 11/23/2016   Lab Results  Component Value Date   TSH 0.011 (L) 11/23/2016   Lab Results  Component Value Date   CHOL 187 11/23/2016   HDL 66 11/23/2016   LDLCALC 101 (H) 11/23/2016   TRIG 101 11/23/2016   Lab Results  Component Value Date   WBC 8.8 01/07/2019   HGB 12.8 01/07/2019   HCT 38.9 01/07/2019   MCV 85.7 01/07/2019   PLT 334 01/07/2019   No results found for: IRON, TIBC, FERRITIN  Attestation Statements:   Reviewed by clinician on day of visit: allergies, medications, problem list, medical history, surgical history, family history, social history, and previous encounter notes.   I, Trixie Dredge, am acting as transcriptionist for Dennard Nip, MD.  I have reviewed the above documentation for accuracy and completeness, and I agree with the above. -  Dennard Nip, MD

## 2020-05-11 DIAGNOSIS — G4733 Obstructive sleep apnea (adult) (pediatric): Secondary | ICD-10-CM | POA: Diagnosis not present

## 2020-05-11 DIAGNOSIS — I10 Essential (primary) hypertension: Secondary | ICD-10-CM | POA: Diagnosis not present

## 2020-05-14 DIAGNOSIS — M67912 Unspecified disorder of synovium and tendon, left shoulder: Secondary | ICD-10-CM | POA: Diagnosis not present

## 2020-06-02 ENCOUNTER — Other Ambulatory Visit: Payer: Self-pay

## 2020-06-02 ENCOUNTER — Encounter (INDEPENDENT_AMBULATORY_CARE_PROVIDER_SITE_OTHER): Payer: Self-pay | Admitting: Family Medicine

## 2020-06-02 ENCOUNTER — Ambulatory Visit (INDEPENDENT_AMBULATORY_CARE_PROVIDER_SITE_OTHER): Payer: 59 | Admitting: Family Medicine

## 2020-06-02 ENCOUNTER — Other Ambulatory Visit (INDEPENDENT_AMBULATORY_CARE_PROVIDER_SITE_OTHER): Payer: Self-pay | Admitting: Family Medicine

## 2020-06-02 VITALS — BP 101/72 | HR 77 | Temp 97.9°F | Ht 63.0 in | Wt 192.0 lb

## 2020-06-02 DIAGNOSIS — K5909 Other constipation: Secondary | ICD-10-CM

## 2020-06-02 DIAGNOSIS — Z9189 Other specified personal risk factors, not elsewhere classified: Secondary | ICD-10-CM | POA: Diagnosis not present

## 2020-06-02 DIAGNOSIS — E669 Obesity, unspecified: Secondary | ICD-10-CM

## 2020-06-02 DIAGNOSIS — Z6834 Body mass index (BMI) 34.0-34.9, adult: Secondary | ICD-10-CM | POA: Diagnosis not present

## 2020-06-02 DIAGNOSIS — F3289 Other specified depressive episodes: Secondary | ICD-10-CM | POA: Diagnosis not present

## 2020-06-02 DIAGNOSIS — E559 Vitamin D deficiency, unspecified: Secondary | ICD-10-CM

## 2020-06-02 MED ORDER — ESCITALOPRAM OXALATE 20 MG PO TABS: 20.0000 mg | ORAL_TABLET | ORAL | 0 refills | Status: DC

## 2020-06-02 MED ORDER — VITAMIN D (ERGOCALCIFEROL) 1.25 MG (50000 UNIT) PO CAPS: 50000.0000 [IU] | ORAL_CAPSULE | ORAL | 0 refills | Status: DC

## 2020-06-03 ENCOUNTER — Other Ambulatory Visit (INDEPENDENT_AMBULATORY_CARE_PROVIDER_SITE_OTHER): Payer: Self-pay | Admitting: Family Medicine

## 2020-06-08 NOTE — Progress Notes (Signed)
Chief Complaint:   OBESITY Katherine Smith is here to discuss her progress with her obesity treatment plan along with follow-up of her obesity related diagnoses. Katherine Smith is on keeping a food journal and adhering to recommended goals of 1300-1500 calories and 80+ grams of protein daily and states she is following her eating plan approximately 80% of the time. Katherine Smith states she is walking for 20-30 minutes 3 times per week.  Today's visit was #: 58 Starting weight: 240 lbs Starting date: 08/10/2016 Today's weight: 192 lbs Today's date: 06/02/2020 Total lbs lost to date: 48 Total lbs lost since last in-office visit: 3  Interim History: Katherine Smith continues to do well with weight loss. She has had extra challenges. She is doing well with avoiding temptations and trying to increase her protein. She is stable on Saxenda, and denies nausea or vomiting.  Subjective:   1. Other depression with emotional eating Katherine Smith is tolerating Lexapro, and she is doing well decreasing emotional eating.  2. Vitamin D deficiency Katherine Smith is stable on Vit D, and she denies nausea, vomiting, or muscle weakness.  3. Other constipation Katherine Smith notes decreased BM frequency on Saxenda, with no improvement with colace. She has taken miralax as needed and a laxative at times.  4. At risk for nausea [name] is at risk for nausea due to Saxenda.  Assessment/Plan:   1. Other depression with emotional eating Behavior modification techniques were discussed today to help [name] deal with her emotional/non-hunger eating behaviors. We will refill Lexapro for 1 month, and will continue to follow closely. Orders and follow up as documented in patient record.   - escitalopram (LEXAPRO) 20 MG tablet; Take 1 tablet (20 mg total) by mouth every morning.  Dispense: 30 tablet; Refill: 0  2. Vitamin D deficiency Low Vitamin D level contributes to fatigue and are associated with obesity, breast, and colon cancer. We will refill  prescription Vitamin D for 1 month. Katherine Smith will follow-up for routine testing of Vitamin D, at least 2-3 times per year to avoid over-replacement.  - Vitamin D, Ergocalciferol, (DRISDOL) 1.25 MG (50000 UNIT) CAPS capsule; Take 1 capsule (50,000 Units total) by mouth every 7 (seven) days.  Dispense: 4 capsule; Refill: 0  3. Other constipation Katherine Smith was informed that a decrease in bowel movement frequency is normal while losing weight, but stools should not be hard or painful. Katherine Smith was encouraged to take miralax 1/2-1 dose every day instead of as needed, with water to help prevent exacerbations of constipation. Orders and follow up as documented in patient record.   4. At risk for nausea Katherine Smith was given approximately 15 minutes of nausea prevention counseling today. Katherine Smith is at risk for nausea due to her new or current medication. She was encouraged to titrate her medication slowly, make sure to stay hydrated, eat smaller portions throughout the day, and avoid high fat meals.   5. Class 1 obesity with serious comorbidity and body mass index (BMI) of 34.0 to 34.9 in adult, unspecified obesity type Katherine Smith is currently in the action stage of change. As such, her goal is to continue with weight loss efforts. She has agreed to keeping a food journal and adhering to recommended goals of 1300-1500 calories and 80+ grams of protein daily.   We discussed various medication options to help Katherine Smith with her weight loss efforts and we both agreed to continue Saxenda, and we will refill Saxenda at 3 mg #5 pens for 1 month.  Exercise goals: As is.  Behavioral modification strategies: emotional eating strategies.  [name] has agreed to follow-up with our clinic in 4 weeks. She was informed of the importance of frequent follow-up visits to maximize her success with intensive lifestyle modifications for her multiple health conditions.   Objective:   Blood pressure 101/72, pulse 77, temperature 97.9  F (36.6 C), height 5\' 3"  (1.6 m), weight 192 lb (87.1 kg), last menstrual period 04/14/2016, SpO2 97 %. Body mass index is 34.01 kg/m.  General: Cooperative, alert, well developed, in no acute distress. HEENT: Conjunctivae and lids unremarkable. Cardiovascular: Regular rhythm.  Lungs: Normal work of breathing. Neurologic: No focal deficits.   Lab Results  Component Value Date   CREATININE 0.86 02/25/2020   BUN 22 02/25/2020   NA 140 02/25/2020   K 4.2 02/25/2020   CL 103 02/25/2020   CO2 21 02/25/2020   Lab Results  Component Value Date   ALT 20 02/25/2020   AST 23 02/25/2020   ALKPHOS 91 02/25/2020   BILITOT 0.4 02/25/2020   Lab Results  Component Value Date   HGBA1C 4.9 02/25/2020   HGBA1C 5.2 08/05/2019   HGBA1C 5.1 11/23/2016   HGBA1C 5.3 08/10/2016   Lab Results  Component Value Date   INSULIN 19.3 02/25/2020   INSULIN 16.3 08/05/2019   INSULIN 25.8 (H) 05/08/2019   INSULIN 46.2 (H) 10/02/2018   INSULIN 27.7 (H) 11/23/2016   Lab Results  Component Value Date   TSH 0.011 (L) 11/23/2016   Lab Results  Component Value Date   CHOL 187 11/23/2016   HDL 66 11/23/2016   LDLCALC 101 (H) 11/23/2016   TRIG 101 11/23/2016   Lab Results  Component Value Date   WBC 8.8 01/07/2019   HGB 12.8 01/07/2019   HCT 38.9 01/07/2019   MCV 85.7 01/07/2019   PLT 334 01/07/2019   No results found for: IRON, TIBC, FERRITIN  Attestation Statements:   Reviewed by clinician on day of visit: allergies, medications, problem list, medical history, surgical history, family history, social history, and previous encounter notes.   I, Trixie Dredge, am acting as transcriptionist for Dennard Nip, MD.  I have reviewed the above documentation for accuracy and completeness, and I agree with the above. -  Dennard Nip, MD

## 2020-06-09 ENCOUNTER — Other Ambulatory Visit (INDEPENDENT_AMBULATORY_CARE_PROVIDER_SITE_OTHER): Payer: Self-pay | Admitting: Family Medicine

## 2020-06-09 DIAGNOSIS — M67912 Unspecified disorder of synovium and tendon, left shoulder: Secondary | ICD-10-CM | POA: Diagnosis not present

## 2020-06-09 MED ORDER — SAXENDA 18 MG/3ML ~~LOC~~ SOPN: 3.0000 mg | PEN_INJECTOR | Freq: Every day | SUBCUTANEOUS | 0 refills | Status: DC

## 2020-06-10 ENCOUNTER — Other Ambulatory Visit: Payer: Self-pay | Admitting: Orthopedic Surgery

## 2020-06-10 ENCOUNTER — Other Ambulatory Visit (HOSPITAL_COMMUNITY): Payer: Self-pay | Admitting: Orthopedic Surgery

## 2020-06-10 DIAGNOSIS — M25512 Pain in left shoulder: Secondary | ICD-10-CM

## 2020-06-13 ENCOUNTER — Other Ambulatory Visit: Payer: Self-pay

## 2020-06-13 ENCOUNTER — Ambulatory Visit
Admission: RE | Admit: 2020-06-13 | Discharge: 2020-06-13 | Disposition: A | Discharge status: Home / Self Care | Payer: 59 | Source: Ambulatory Visit | Attending: Orthopedic Surgery | Admitting: Orthopedic Surgery

## 2020-06-13 DIAGNOSIS — M25512 Pain in left shoulder: Secondary | ICD-10-CM | POA: Diagnosis not present

## 2020-06-13 DIAGNOSIS — S46012A Strain of muscle(s) and tendon(s) of the rotator cuff of left shoulder, initial encounter: Secondary | ICD-10-CM | POA: Diagnosis not present

## 2020-06-16 DIAGNOSIS — M75112 Incomplete rotator cuff tear or rupture of left shoulder, not specified as traumatic: Secondary | ICD-10-CM | POA: Diagnosis not present

## 2020-06-18 ENCOUNTER — Other Ambulatory Visit: Payer: Self-pay | Admitting: Orthopedic Surgery

## 2020-06-21 ENCOUNTER — Other Ambulatory Visit: Payer: Self-pay | Admitting: Dermatology

## 2020-06-22 ENCOUNTER — Other Ambulatory Visit: Payer: Self-pay | Admitting: Obstetrics and Gynecology

## 2020-06-22 DIAGNOSIS — Z01419 Encounter for gynecological examination (general) (routine) without abnormal findings: Secondary | ICD-10-CM | POA: Diagnosis not present

## 2020-06-22 DIAGNOSIS — N941 Unspecified dyspareunia: Secondary | ICD-10-CM | POA: Diagnosis not present

## 2020-06-22 DIAGNOSIS — L900 Lichen sclerosus et atrophicus: Secondary | ICD-10-CM | POA: Diagnosis not present

## 2020-06-22 DIAGNOSIS — Z1231 Encounter for screening mammogram for malignant neoplasm of breast: Secondary | ICD-10-CM | POA: Diagnosis not present

## 2020-06-29 ENCOUNTER — Other Ambulatory Visit: Payer: Self-pay

## 2020-06-29 ENCOUNTER — Encounter (HOSPITAL_BASED_OUTPATIENT_CLINIC_OR_DEPARTMENT_OTHER): Payer: Self-pay | Admitting: Orthopedic Surgery

## 2020-07-06 NOTE — Progress Notes (Signed)
Spoke with patient regarding need to come in 11/23 or 11/24 for EKG, Ensure, and Surgical soap. Patient states she plans to come tomorrow between 7A and 3P.

## 2020-07-07 ENCOUNTER — Ambulatory Visit (INDEPENDENT_AMBULATORY_CARE_PROVIDER_SITE_OTHER): Payer: 59 | Admitting: Family Medicine

## 2020-07-07 ENCOUNTER — Encounter (HOSPITAL_BASED_OUTPATIENT_CLINIC_OR_DEPARTMENT_OTHER)
Admission: RE | Admit: 2020-07-07 | Discharge: 2020-07-07 | Disposition: A | Discharge status: Home / Self Care | Payer: 59 | Source: Ambulatory Visit | Attending: Orthopedic Surgery | Admitting: Orthopedic Surgery

## 2020-07-07 ENCOUNTER — Other Ambulatory Visit: Payer: Self-pay

## 2020-07-07 ENCOUNTER — Encounter (INDEPENDENT_AMBULATORY_CARE_PROVIDER_SITE_OTHER): Payer: Self-pay | Admitting: Family Medicine

## 2020-07-07 ENCOUNTER — Other Ambulatory Visit (INDEPENDENT_AMBULATORY_CARE_PROVIDER_SITE_OTHER): Payer: Self-pay | Admitting: Family Medicine

## 2020-07-07 VITALS — BP 118/81 | HR 74 | Temp 98.0°F | Ht 63.0 in | Wt 194.0 lb

## 2020-07-07 DIAGNOSIS — Z6834 Body mass index (BMI) 34.0-34.9, adult: Secondary | ICD-10-CM | POA: Diagnosis not present

## 2020-07-07 DIAGNOSIS — E559 Vitamin D deficiency, unspecified: Secondary | ICD-10-CM

## 2020-07-07 DIAGNOSIS — F3289 Other specified depressive episodes: Secondary | ICD-10-CM | POA: Diagnosis not present

## 2020-07-07 DIAGNOSIS — Z888 Allergy status to other drugs, medicaments and biological substances status: Secondary | ICD-10-CM | POA: Diagnosis not present

## 2020-07-07 DIAGNOSIS — M25812 Other specified joint disorders, left shoulder: Secondary | ICD-10-CM | POA: Diagnosis not present

## 2020-07-07 DIAGNOSIS — Z79899 Other long term (current) drug therapy: Secondary | ICD-10-CM | POA: Diagnosis not present

## 2020-07-07 DIAGNOSIS — E669 Obesity, unspecified: Secondary | ICD-10-CM | POA: Diagnosis not present

## 2020-07-07 DIAGNOSIS — E063 Autoimmune thyroiditis: Secondary | ICD-10-CM | POA: Diagnosis not present

## 2020-07-07 DIAGNOSIS — E8881 Metabolic syndrome: Secondary | ICD-10-CM | POA: Diagnosis not present

## 2020-07-07 DIAGNOSIS — Z79890 Hormone replacement therapy (postmenopausal): Secondary | ICD-10-CM | POA: Diagnosis not present

## 2020-07-07 DIAGNOSIS — M75112 Incomplete rotator cuff tear or rupture of left shoulder, not specified as traumatic: Secondary | ICD-10-CM | POA: Diagnosis not present

## 2020-07-07 DIAGNOSIS — E038 Other specified hypothyroidism: Secondary | ICD-10-CM

## 2020-07-07 DIAGNOSIS — E7849 Other hyperlipidemia: Secondary | ICD-10-CM | POA: Diagnosis not present

## 2020-07-07 DIAGNOSIS — E538 Deficiency of other specified B group vitamins: Secondary | ICD-10-CM

## 2020-07-07 DIAGNOSIS — Z7982 Long term (current) use of aspirin: Secondary | ICD-10-CM | POA: Diagnosis not present

## 2020-07-07 DIAGNOSIS — Z9189 Other specified personal risk factors, not elsewhere classified: Secondary | ICD-10-CM

## 2020-07-07 MED ORDER — VITAMIN D (ERGOCALCIFEROL) 1.25 MG (50000 UNIT) PO CAPS: 50000.0000 [IU] | ORAL_CAPSULE | ORAL | 0 refills | Status: DC

## 2020-07-07 MED ORDER — ESCITALOPRAM OXALATE 20 MG PO TABS: 20.0000 mg | ORAL_TABLET | ORAL | 0 refills | Status: DC

## 2020-07-07 NOTE — Progress Notes (Signed)

## 2020-07-08 LAB — CBC WITH DIFFERENTIAL/PLATELET
Basophils Absolute: 0.1 10*3/uL (ref 0.0–0.2)
Basos: 1 %
EOS (ABSOLUTE): 0.2 10*3/uL (ref 0.0–0.4)
Eos: 3 %
Hematocrit: 41.1 % (ref 34.0–46.6)
Hemoglobin: 13.5 g/dL (ref 11.1–15.9)
Immature Grans (Abs): 0 10*3/uL (ref 0.0–0.1)
Immature Granulocytes: 0 %
Lymphocytes Absolute: 1.6 10*3/uL (ref 0.7–3.1)
Lymphs: 29 %
MCH: 28 pg (ref 26.6–33.0)
MCHC: 32.8 g/dL (ref 31.5–35.7)
MCV: 85 fL (ref 79–97)
Monocytes Absolute: 0.5 10*3/uL (ref 0.1–0.9)
Monocytes: 8 %
Neutrophils Absolute: 3.2 10*3/uL (ref 1.4–7.0)
Neutrophils: 59 %
Platelets: 297 10*3/uL (ref 150–450)
RBC: 4.83 x10E6/uL (ref 3.77–5.28)
RDW: 13.3 % (ref 11.7–15.4)
WBC: 5.5 10*3/uL (ref 3.4–10.8)

## 2020-07-08 LAB — COMPREHENSIVE METABOLIC PANEL
ALT: 16 IU/L (ref 0–32)
AST: 19 IU/L (ref 0–40)
Albumin/Globulin Ratio: 2 (ref 1.2–2.2)
Albumin: 4.5 g/dL (ref 3.8–4.9)
Alkaline Phosphatase: 88 IU/L (ref 44–121)
BUN/Creatinine Ratio: 18 (ref 12–28)
BUN: 17 mg/dL (ref 8–27)
Bilirubin Total: 0.5 mg/dL (ref 0.0–1.2)
CO2: 25 mmol/L (ref 20–29)
Calcium: 9.4 mg/dL (ref 8.7–10.3)
Chloride: 104 mmol/L (ref 96–106)
Creatinine, Ser: 0.93 mg/dL (ref 0.57–1.00)
GFR calc Af Amer: 77 mL/min/{1.73_m2} (ref >59)
GFR calc non Af Amer: 67 mL/min/{1.73_m2} (ref >59)
Globulin, Total: 2.3 g/dL (ref 1.5–4.5)
Glucose: 108 mg/dL — ABNORMAL HIGH (ref 65–99)
Potassium: 4.1 mmol/L (ref 3.5–5.2)
Sodium: 144 mmol/L (ref 134–144)
Total Protein: 6.8 g/dL (ref 6.0–8.5)

## 2020-07-08 LAB — LIPID PANEL WITH LDL/HDL RATIO
Cholesterol, Total: 254 mg/dL — ABNORMAL HIGH (ref 100–199)
HDL: 60 mg/dL (ref >39)
LDL Chol Calc (NIH): 174 mg/dL — ABNORMAL HIGH (ref 0–99)
LDL/HDL Ratio: 2.9 ratio (ref 0.0–3.2)
Triglycerides: 115 mg/dL (ref 0–149)
VLDL Cholesterol Cal: 20 mg/dL (ref 5–40)

## 2020-07-08 LAB — T4, FREE: Free T4: 1.46 ng/dL (ref 0.82–1.77)

## 2020-07-08 LAB — INSULIN, RANDOM: INSULIN: 13.9 u[IU]/mL (ref 2.6–24.9)

## 2020-07-08 LAB — VITAMIN B12: Vitamin B-12: 242 pg/mL (ref 232–1245)

## 2020-07-08 LAB — T3: T3, Total: 106 ng/dL (ref 71–180)

## 2020-07-08 LAB — HEMOGLOBIN A1C
Est. average glucose Bld gHb Est-mCnc: 103 mg/dL
Hgb A1c MFr Bld: 5.2 % (ref 4.8–5.6)

## 2020-07-08 LAB — VITAMIN D 25 HYDROXY (VIT D DEFICIENCY, FRACTURES): Vit D, 25-Hydroxy: 44 ng/mL (ref 30.0–100.0)

## 2020-07-08 LAB — TSH: TSH: 1.34 u[IU]/mL (ref 0.450–4.500)

## 2020-07-10 ENCOUNTER — Other Ambulatory Visit (HOSPITAL_COMMUNITY)
Admission: RE | Admit: 2020-07-10 | Discharge: 2020-07-10 | Disposition: A | Discharge status: Home / Self Care | Payer: 59 | Source: Ambulatory Visit | Attending: Orthopedic Surgery | Admitting: Orthopedic Surgery

## 2020-07-10 DIAGNOSIS — Z01812 Encounter for preprocedural laboratory examination: Secondary | ICD-10-CM | POA: Diagnosis not present

## 2020-07-10 DIAGNOSIS — Z20822 Contact with and (suspected) exposure to covid-19: Secondary | ICD-10-CM | POA: Diagnosis not present

## 2020-07-11 LAB — SARS CORONAVIRUS 2 (TAT 6-24 HRS): SARS Coronavirus 2: NEGATIVE

## 2020-07-12 ENCOUNTER — Ambulatory Visit (HOSPITAL_BASED_OUTPATIENT_CLINIC_OR_DEPARTMENT_OTHER)
Admission: RE | Admit: 2020-07-12 | Discharge: 2020-07-12 | Disposition: A | Discharge status: Home / Self Care | Payer: 59 | Attending: Orthopedic Surgery | Admitting: Orthopedic Surgery

## 2020-07-12 ENCOUNTER — Encounter (HOSPITAL_BASED_OUTPATIENT_CLINIC_OR_DEPARTMENT_OTHER)
Admission: RE | Disposition: A | Discharge status: Home / Self Care | Payer: Self-pay | Source: Home / Self Care | Attending: Orthopedic Surgery

## 2020-07-12 ENCOUNTER — Ambulatory Visit (HOSPITAL_BASED_OUTPATIENT_CLINIC_OR_DEPARTMENT_OTHER): Payer: 59 | Admitting: Anesthesiology

## 2020-07-12 ENCOUNTER — Encounter (HOSPITAL_BASED_OUTPATIENT_CLINIC_OR_DEPARTMENT_OTHER): Payer: Self-pay | Admitting: Orthopedic Surgery

## 2020-07-12 ENCOUNTER — Other Ambulatory Visit: Payer: Self-pay | Admitting: Surgical

## 2020-07-12 ENCOUNTER — Other Ambulatory Visit: Payer: Self-pay

## 2020-07-12 DIAGNOSIS — Z7982 Long term (current) use of aspirin: Secondary | ICD-10-CM | POA: Diagnosis not present

## 2020-07-12 DIAGNOSIS — G8918 Other acute postprocedural pain: Secondary | ICD-10-CM | POA: Diagnosis not present

## 2020-07-12 DIAGNOSIS — Z79890 Hormone replacement therapy (postmenopausal): Secondary | ICD-10-CM | POA: Insufficient documentation

## 2020-07-12 DIAGNOSIS — M75112 Incomplete rotator cuff tear or rupture of left shoulder, not specified as traumatic: Secondary | ICD-10-CM | POA: Diagnosis not present

## 2020-07-12 DIAGNOSIS — Z79899 Other long term (current) drug therapy: Secondary | ICD-10-CM | POA: Diagnosis not present

## 2020-07-12 DIAGNOSIS — Z888 Allergy status to other drugs, medicaments and biological substances status: Secondary | ICD-10-CM | POA: Diagnosis not present

## 2020-07-12 DIAGNOSIS — M75102 Unspecified rotator cuff tear or rupture of left shoulder, not specified as traumatic: Secondary | ICD-10-CM | POA: Diagnosis not present

## 2020-07-12 DIAGNOSIS — M7542 Impingement syndrome of left shoulder: Secondary | ICD-10-CM | POA: Diagnosis not present

## 2020-07-12 DIAGNOSIS — M25812 Other specified joint disorders, left shoulder: Secondary | ICD-10-CM | POA: Insufficient documentation

## 2020-07-12 DIAGNOSIS — I10 Essential (primary) hypertension: Secondary | ICD-10-CM | POA: Diagnosis not present

## 2020-07-12 HISTORY — PX: SHOULDER ARTHROSCOPY WITH ROTATOR CUFF REPAIR AND SUBACROMIAL DECOMPRESSION: SHX5686

## 2020-07-12 SURGERY — SHOULDER ARTHROSCOPY WITH ROTATOR CUFF REPAIR AND SUBACROMIAL DECOMPRESSION
Anesthesia: General | Site: Shoulder | Laterality: Left

## 2020-07-12 MED ORDER — FENTANYL CITRATE (PF) 100 MCG/2ML IJ SOLN
INTRAMUSCULAR | Status: DC | PRN
  Administered 2020-07-12 (×2): 50 ug via INTRAVENOUS

## 2020-07-12 MED ORDER — EPHEDRINE 5 MG/ML INJ
INTRAVENOUS | Status: AC
  Filled 2020-07-12: qty 10

## 2020-07-12 MED ORDER — MEPERIDINE HCL 25 MG/ML IJ SOLN: 6.2500 mg | INTRAMUSCULAR | Status: DC | PRN

## 2020-07-12 MED ORDER — BUPIVACAINE LIPOSOME 1.3 % IJ SUSP
INTRAMUSCULAR | Status: DC | PRN
  Administered 2020-07-12: 10 mL via PERINEURAL

## 2020-07-12 MED ORDER — MIDAZOLAM HCL 2 MG/2ML IJ SOLN
INTRAMUSCULAR | Status: AC
  Filled 2020-07-12: qty 2

## 2020-07-12 MED ORDER — ONDANSETRON HCL 4 MG/2ML IJ SOLN
INTRAMUSCULAR | Status: DC | PRN
  Administered 2020-07-12: 4 mg via INTRAVENOUS

## 2020-07-12 MED ORDER — LACTATED RINGERS IV SOLN: INTRAVENOUS | Status: DC

## 2020-07-12 MED ORDER — CEFAZOLIN SODIUM-DEXTROSE 2-4 GM/100ML-% IV SOLN
INTRAVENOUS | Status: AC
  Filled 2020-07-12: qty 100

## 2020-07-12 MED ORDER — FENTANYL CITRATE (PF) 100 MCG/2ML IJ SOLN
INTRAMUSCULAR | Status: AC
  Filled 2020-07-12: qty 2

## 2020-07-12 MED ORDER — LIDOCAINE 2% (20 MG/ML) 5 ML SYRINGE
INTRAMUSCULAR | Status: AC
  Filled 2020-07-12: qty 5

## 2020-07-12 MED ORDER — ROCURONIUM BROMIDE 100 MG/10ML IV SOLN
INTRAVENOUS | Status: DC | PRN
  Administered 2020-07-12: 90 mg via INTRAVENOUS

## 2020-07-12 MED ORDER — DEXAMETHASONE SODIUM PHOSPHATE 4 MG/ML IJ SOLN
INTRAMUSCULAR | Status: DC | PRN
  Administered 2020-07-12: 10 mg via INTRAVENOUS

## 2020-07-12 MED ORDER — EPHEDRINE SULFATE 50 MG/ML IJ SOLN
INTRAMUSCULAR | Status: DC | PRN
  Administered 2020-07-12: 10 mg via INTRAVENOUS
  Administered 2020-07-12: 15 mg via INTRAVENOUS

## 2020-07-12 MED ORDER — BUPIVACAINE HCL (PF) 0.5 % IJ SOLN
INTRAMUSCULAR | Status: DC | PRN
  Administered 2020-07-12: 20 mL via PERINEURAL

## 2020-07-12 MED ORDER — PROMETHAZINE HCL 25 MG/ML IJ SOLN: 6.2500 mg | INTRAMUSCULAR | Status: DC | PRN

## 2020-07-12 MED ORDER — ONDANSETRON HCL 4 MG/2ML IJ SOLN
INTRAMUSCULAR | Status: AC
  Filled 2020-07-12: qty 2

## 2020-07-12 MED ORDER — CEFAZOLIN SODIUM-DEXTROSE 2-4 GM/100ML-% IV SOLN
2.0000 g | INTRAVENOUS | Status: AC
  Administered 2020-07-12: 2 g via INTRAVENOUS

## 2020-07-12 MED ORDER — HYDROMORPHONE HCL 1 MG/ML IJ SOLN: 0.2500 mg | INTRAMUSCULAR | Status: DC | PRN

## 2020-07-12 MED ORDER — SODIUM CHLORIDE 0.9 % IV SOLN
INTRAVENOUS | Status: DC | PRN
  Administered 2020-07-12: 80 ug via INTRAVENOUS

## 2020-07-12 MED ORDER — SUGAMMADEX SODIUM 200 MG/2ML IV SOLN
INTRAVENOUS | Status: DC | PRN
  Administered 2020-07-12: 200 mg via INTRAVENOUS

## 2020-07-12 MED ORDER — OXYCODONE HCL 5 MG PO TABS: 5.0000 mg | ORAL_TABLET | Freq: Once | ORAL | Status: DC | PRN

## 2020-07-12 MED ORDER — SODIUM CHLORIDE 0.9 % IR SOLN
Status: DC | PRN
  Administered 2020-07-12: 4500 mL

## 2020-07-12 MED ORDER — AMISULPRIDE (ANTIEMETIC) 5 MG/2ML IV SOLN: 10.0000 mg | Freq: Once | INTRAVENOUS | Status: DC | PRN

## 2020-07-12 MED ORDER — PROPOFOL 10 MG/ML IV BOLUS
INTRAVENOUS | Status: AC
  Filled 2020-07-12: qty 20

## 2020-07-12 MED ORDER — DEXAMETHASONE SODIUM PHOSPHATE 10 MG/ML IJ SOLN
INTRAMUSCULAR | Status: AC
  Filled 2020-07-12: qty 1

## 2020-07-12 MED ORDER — LIDOCAINE HCL (CARDIAC) PF 100 MG/5ML IV SOSY
PREFILLED_SYRINGE | INTRAVENOUS | Status: DC | PRN
  Administered 2020-07-12: 60 mg via INTRAVENOUS

## 2020-07-12 MED ORDER — OXYCODONE HCL 5 MG/5ML PO SOLN: 5.0000 mg | Freq: Once | ORAL | Status: DC | PRN

## 2020-07-12 MED ORDER — PROPOFOL 10 MG/ML IV BOLUS
INTRAVENOUS | Status: DC | PRN
  Administered 2020-07-12: 150 mg via INTRAVENOUS

## 2020-07-12 MED ORDER — TIZANIDINE HCL 4 MG PO TABS: 4.0000 mg | ORAL_TABLET | Freq: Three times a day (TID) | ORAL | 1 refills | Status: DC | PRN

## 2020-07-12 MED ORDER — MIDAZOLAM HCL 2 MG/2ML IJ SOLN
2.0000 mg | Freq: Once | INTRAMUSCULAR | Status: AC
  Administered 2020-07-12: 2 mg via INTRAVENOUS

## 2020-07-12 MED ORDER — OXYCODONE-ACETAMINOPHEN 5-325 MG PO TABS
ORAL_TABLET | ORAL | 0 refills | Status: DC
Start: 2020-07-12 — End: 2020-10-27

## 2020-07-12 MED ORDER — FENTANYL CITRATE (PF) 100 MCG/2ML IJ SOLN
100.0000 ug | Freq: Once | INTRAMUSCULAR | Status: AC
  Administered 2020-07-12: 100 ug via INTRAVENOUS

## 2020-07-12 SURGICAL SUPPLY — 57 items
ANCHOR PEEK 4.75X19.1 SWLK C (Anchor) ×2 IMPLANT
BENZOIN TINCTURE PRP APPL 2/3 (GAUZE/BANDAGES/DRESSINGS) IMPLANT
BURR OVAL 8 FLU 4.0X13 (MISCELLANEOUS) ×2 IMPLANT
CANNULA 5.75X7 CRYSTAL CLEAR (CANNULA) ×2 IMPLANT
CANNULA TWIST IN 8.25X7CM (CANNULA) ×2 IMPLANT
CHLORAPREP W/TINT 26 (MISCELLANEOUS) ×2 IMPLANT
COVER WAND RF STERILE (DRAPES) IMPLANT
CUTTER BONE 4.0MM X 13CM (MISCELLANEOUS) ×2 IMPLANT
DECANTER SPIKE VIAL GLASS SM (MISCELLANEOUS) IMPLANT
DRAPE IMP U-DRAPE 54X76 (DRAPES) ×2 IMPLANT
DRAPE INCISE IOBAN 66X45 STRL (DRAPES) ×2 IMPLANT
DRAPE STERI 35X30 U-POUCH (DRAPES) ×2 IMPLANT
DRAPE SURG 17X23 STRL (DRAPES) ×2 IMPLANT
DRAPE U-SHAPE 47X51 STRL (DRAPES) ×2 IMPLANT
DRAPE U-SHAPE 76X120 STRL (DRAPES) ×4 IMPLANT
DRSG PAD ABDOMINAL 8X10 ST (GAUZE/BANDAGES/DRESSINGS) ×2 IMPLANT
ELECT REM PT RETURN 9FT ADLT (ELECTROSURGICAL)
ELECTRODE REM PT RTRN 9FT ADLT (ELECTROSURGICAL) IMPLANT
GAUZE 4X4 16PLY RFD (DISPOSABLE) IMPLANT
GAUZE SPONGE 4X4 12PLY STRL (GAUZE/BANDAGES/DRESSINGS) ×2 IMPLANT
GAUZE XEROFORM 1X8 LF (GAUZE/BANDAGES/DRESSINGS) ×2 IMPLANT
GLOVE BIO SURGEON STRL SZ7 (GLOVE) ×4 IMPLANT
GLOVE BIO SURGEON STRL SZ7.5 (GLOVE) ×2 IMPLANT
GLOVE BIOGEL PI IND STRL 7.0 (GLOVE) ×3 IMPLANT
GLOVE BIOGEL PI IND STRL 8 (GLOVE) ×1 IMPLANT
GLOVE BIOGEL PI INDICATOR 7.0 (GLOVE) ×3
GLOVE BIOGEL PI INDICATOR 8 (GLOVE) ×1
GLOVE ECLIPSE 6.5 STRL STRAW (GLOVE) ×2 IMPLANT
GOWN STRL REUS W/ TWL LRG LVL3 (GOWN DISPOSABLE) ×3 IMPLANT
GOWN STRL REUS W/ TWL XL LVL3 (GOWN DISPOSABLE) ×1 IMPLANT
GOWN STRL REUS W/TWL LRG LVL3 (GOWN DISPOSABLE) ×3
GOWN STRL REUS W/TWL XL LVL3 (GOWN DISPOSABLE) ×1
IV NS IRRIG 3000ML ARTHROMATIC (IV SOLUTION) ×4 IMPLANT
LASSO CRESCENT QUICKPASS (SUTURE) IMPLANT
MANIFOLD NEPTUNE II (INSTRUMENTS) ×2 IMPLANT
NEEDLE SCORPION MULTI FIRE (NEEDLE) ×2 IMPLANT
NS IRRIG 1000ML POUR BTL (IV SOLUTION) IMPLANT
PACK ARTHROSCOPY DSU (CUSTOM PROCEDURE TRAY) ×2 IMPLANT
PACK BASIN DAY SURGERY FS (CUSTOM PROCEDURE TRAY) ×2 IMPLANT
PROBE BIPOLAR ATHRO 135MM 90D (MISCELLANEOUS) ×2 IMPLANT
RESTRAINT HEAD UNIVERSAL NS (MISCELLANEOUS) ×2 IMPLANT
SLEEVE SCD COMPRESS KNEE MED (MISCELLANEOUS) ×2 IMPLANT
SLING ARM FOAM STRAP LRG (SOFTGOODS) ×2 IMPLANT
STRIP CLOSURE SKIN 1/2X4 (GAUZE/BANDAGES/DRESSINGS) IMPLANT
SUPPORT WRAP ARM LG (MISCELLANEOUS) ×2 IMPLANT
SUT ETHILON 3 0 PS 1 (SUTURE) ×2 IMPLANT
SUT FIBERWIRE #2 38 T-5 BLUE (SUTURE)
SUT PDS AB 0 CT 36 (SUTURE) IMPLANT
SUT TIGER TAPE 7 IN WHITE (SUTURE) IMPLANT
SUTURE FIBERWR #2 38 T-5 BLUE (SUTURE) IMPLANT
SUTURE TAPE 1.3 40 TPR END (SUTURE) ×2 IMPLANT
SUTURETAPE 1.3 40 TPR END (SUTURE) ×4
TAPE FIBER 2MM 7IN #2 BLUE (SUTURE) IMPLANT
TOWEL GREEN STERILE FF (TOWEL DISPOSABLE) ×2 IMPLANT
TUBE CONNECTING 20X1/4 (TUBING) ×2 IMPLANT
TUBING ARTHROSCOPY IRRIG 16FT (MISCELLANEOUS) ×2 IMPLANT
WATER STERILE IRR 1000ML POUR (IV SOLUTION) IMPLANT

## 2020-07-12 NOTE — H&P (Signed)
Katherine Smith is an 59 y.o. female.   Chief Complaint: L shoulder pain and dysfunction HPI: L shoulder pain with partial RCT, failed nonop tx.  Past Medical History:  Diagnosis Date  . Anxiety   . Back pain   . Cancer (HCC)    BASAL CELL SKIN CANCER  . Depression   . Fatty liver   . GERD (gastroesophageal reflux disease)   . Hashimoto's thyroiditis   . HTN (hypertension)   . Hyperlipidemia   . Hypothyroidism   . IBS (irritable bowel syndrome)   . Insomnia   . Joint pain   . Lactose intolerance   . Lichen sclerosus   . Obesity   . Psoriasis   . Sleep apnea     Past Surgical History:  Procedure Laterality Date  . ANTERIOR DISC ARTHROPLASTY     2004  . CATARACT EXTRACTION W/ INTRAOCULAR LENS IMPLANT Bilateral   . COLONOSCOPY  12/18/2011  . COLONOSCOPY WITH PROPOFOL N/A 04/04/2017   Procedure: COLONOSCOPY WITH PROPOFOL;  Surgeon: Manya Silvas, MD;  Location: [facility] ENDOSCOPY;  Service: Endoscopy;  Laterality: N/A;    Family History  Problem Relation Age of Onset  . Hypertension Mother   . Hyperlipidemia Mother   . Hypertension Father   . Breast cancer Neg Hx    Social History:  reports that she has never smoked. She has never used smokeless tobacco. She reports that she does not drink alcohol and does not use drugs.  Allergies:  Allergies  Allergen Reactions  . Atorvastatin     Other reaction(s): Muscle Pain  . Dexfenfluramine     Other reaction(s): Unknown    Medications Prior to Admission  Medication Sig Dispense Refill  . Aspirin-Acetaminophen-Caffeine (EXCEDRIN MIGRAINE PO) Take 1 tablet by mouth daily as needed.    . clobetasol (TEMOVATE) 0.05 % external solution Apply 1 application topically 2 (two) times daily.    Marland Kitchen escitalopram (LEXAPRO) 20 MG tablet Take 1 tablet (20 mg total) by mouth every morning. 30 tablet 0  . Insulin Pen Needle (BD PEN NEEDLE NANO 2ND GEN) 32G X 4 MM MISC USE 2 TIMES DAILY. 100 each 0  . levothyroxine (SYNTHROID) 112 MCG  tablet Take 112 mcg by mouth daily before breakfast.     . Liraglutide -Weight Management (SAXENDA) 18 MG/3ML SOPN Inject 3 mg into the skin daily. (Patient taking differently: Inject 3 mg into the skin daily. ) 15 mL 0  . Multiple Vitamins-Minerals (HAIR SKIN & NAILS ADVANCED PO) Take 1 tablet by mouth daily.    . naproxen sodium (ANAPROX) 220 MG tablet Take 220 mg by mouth daily as needed.    . pantoprazole (PROTONIX) 40 MG tablet TAKE ONE TABLET BY MOUTH ONCE DAILY    . Vitamin D, Ergocalciferol, (DRISDOL) 1.25 MG (50000 UNIT) CAPS capsule Take 1 capsule (50,000 Units total) by mouth every 7 (seven) days. 4 capsule 0    No results found for this or any previous visit (from the past 48 hour(s)). No results found.  Review of Systems  All other systems reviewed and are negative.   Blood pressure (!) 99/51, pulse 61, temperature 97.9 F (36.6 C), temperature source Oral, resp. rate (!) 24, height 5\' 3"  (1.6 m), weight 91.6 kg, last menstrual period 04/14/2016, SpO2 98 %. Physical Exam HENT:     Head: Atraumatic.  Eyes:     Extraocular Movements: Extraocular movements intact.  Cardiovascular:     Pulses: Normal pulses.  Pulmonary:  Effort: Pulmonary effort is normal.  Musculoskeletal:     Comments: L shoulder pain with RC testing.  Neurological:     Mental Status: She is alert.      Assessment/Plan L shoulder pain with partial RCT, failed nonop tx. Plan arth RCR vs RCD, SAD Risks / benefits of surgery discussed Consent on chart  NPO for OR Preop antibiotics   Katherine Stalling, MD 07/12/2020, 1:35 PM

## 2020-07-12 NOTE — Op Note (Signed)
Procedure(s): SHOULDER ARTHROSCOPY WITH ARTHROSCOPIC  ROTATOR CUFF REPAIR AND SUBACROMIAL DECOMPRESSION Procedure Note  Katherine Smith female 59 y.o. 07/12/2020   Preoperative diagnosis: Left shoulder partial-thickness rotator cuff tear #2 left shoulder impingement with unfavorable acromial anatomy  Postoperative diagnosis: Same  Procedure(s) and Anesthesia Type:    * SHOULDER ARTHROSCOPY WITH ARTHROSCOPIC  ROTATOR CUFF REPAIR AND SUBACROMIAL DECOMPRESSION - General  Surgeon(s) and Role:    Tania Ade, MD - Primary     Surgeon: Katherine Smith   Assistants: Jeanmarie Hubert PA-C [facility] was present and scrubbed throughout the procedure and was essential in positioning, assisting with the camera and instrumentation,, and closure)  Anesthesia: General endotracheal anesthesia with preoperative interscalene block given by the attending anesthesiologist    Procedure Detail  SHOULDER ARTHROSCOPY WITH ARTHROSCOPIC  ROTATOR CUFF REPAIR AND SUBACROMIAL DECOMPRESSION  Estimated Blood Loss: Min         Drains: none  Blood Given: none         Specimens: none        Complications:  * No complications entered in OR log *         Disposition: PACU - hemodynamically stable.         Condition: stable    Procedure:   INDICATIONS FOR SURGERY: The patient is 59 y.o. female who has had a long history of left shoulder pain.  She failed conservative management.  MRI showed partial-thickness tearing indicated for debridement versus repair to decrease pain and restore function.  OPERATIVE FINDINGS: Examination under anesthesia: No significant stiffness or instability.   DESCRIPTION OF PROCEDURE: The patient was identified in preoperative  holding area where I personally marked the operative site after  verifying site, side, and procedure with the patient. An interscalene block was given by the attending anesthesiologist the holding area.  The patient was taken back  to the operating room where general anesthesia was induced without complication and was placed in the beach-chair position with the back  elevated about 60 degrees and all extremities and head and neck carefully padded and  positioned.   The left upper extremity was then prepped and  draped in a standard sterile fashion. The appropriate time-out  procedure was carried out. The patient did receive IV antibiotics  within 30 minutes of incision.   A small posterior portal incision was made and the arthroscope was introduced into the joint. An anterior portal was then established above the subscapularis using needle localization. Small cannula was placed anteriorly. Diagnostic arthroscopy was then carried out   The subscapularis was noted to be completely intact.  Superior labrum and biceps tendon appeared healthy.  Glenohumeral joint surfaces were intact without significant chondromalacia.  The undersurface of the supraspinatus had partial tearing with about 15% of the inner surface.  This was debrided back to healthy tendon.  There did not appear to be any full-thickness penetration.  The arthroscope was then introduced into the subacromial space a standard lateral portal was established with needle localization. The shaver was used through the lateral portal to perform extensive bursectomy. Coracoacromial ligament was examined and found to be frayed indicating chronic impingement.  The bursal surface of the rotator cuff was carefully examined and probed.  Anteriorly in the region of concern by MRI there was significant thinning and bulbous appearing area consistent with significant intrasubstance tearing.  I felt that by palpation this was consistent with the area of concern by MRI.  Therefore using an 11 blade the superficial layer  of tendon was opened identifying the significant intrasubstance tear with a large area of exposed tuberosity.  At this point the area was debrided down to a bleeding bony  surface with the bur.  The repair was then carried out by first placing 2 suture tapes in an inverted mattress configuration through the tear and these 4 suture strands were then brought over to a 4.75 peek swivel lock anchor bringing the tendon nicely down over the prepared tuberosity.  The coracoacromial ligament was taken down off the anterior acromion with the ArthroCare exposing a moderate anterior acromial spur. A high-speed bur was then used through the lateral portal to take down the anterior acromial spur from lateral to medial in a standard acromioplasty.  The acromioplasty was also viewed from the lateral portal and the bur was used as necessary to ensure that the acromion was completely flat from posterior to anterior.  The arthroscopic equipment was removed from the joint and the portals were closed with 3-0 nylon in an interrupted fashion. Sterile dressings were then applied including Xeroform 4 x 4's ABDs and tape. The patient was then allowed to awaken from general anesthesia, placed in a sling, transferred to the stretcher and taken to the recovery room in stable condition.   POSTOPERATIVE PLAN: The patient will be discharged home today and will followup in one week for suture removal and wound check.

## 2020-07-12 NOTE — Anesthesia Procedure Notes (Signed)
Procedure Name: Intubation Date/Time: 07/12/2020 1:46 PM Performed by: Glory Buff, CRNA Pre-anesthesia Checklist: Patient identified, Emergency Drugs available, Suction available and Patient being monitored Patient Re-evaluated:Patient Re-evaluated prior to induction Oxygen Delivery Method: Circle system utilized Preoxygenation: Pre-oxygenation with 100% oxygen Induction Type: IV induction Ventilation: Mask ventilation without difficulty Laryngoscope Size: Miller and 3 Grade View: Grade II Tube type: Oral Tube size: 7.0 mm Number of attempts: 1 Airway Equipment and Method: Stylet and Oral airway Placement Confirmation: ETT inserted through vocal cords under direct vision,  positive ETCO2 and breath sounds checked- equal and bilateral Secured at: 21 cm Tube secured with: Tape Dental Injury: Teeth and Oropharynx as per pre-operative assessment

## 2020-07-12 NOTE — Transfer of Care (Signed)
Immediate Anesthesia Transfer of Care Note  Patient: Katherine Smith  Procedure(s) Performed: SHOULDER ARTHROSCOPY WITH ARTHROSCOPIC  ROTATOR CUFF REPAIR AND SUBACROMIAL DECOMPRESSION (Left Shoulder)  Patient Location: PACU  Anesthesia Type:General and Regional  Level of Consciousness: awake, alert  and oriented  Airway & Oxygen Therapy: Patient Spontanous Breathing and Patient connected to face mask oxygen  Post-op Assessment: Report given to RN and Post -op Vital signs reviewed and stable  Post vital signs: Reviewed and stable  Last Vitals:  Vitals Value Taken Time  BP    Temp    Pulse 77 07/12/20 1448  Resp 18 07/12/20 1448  SpO2 99 % 07/12/20 1448  Vitals shown include unvalidated device data.  Last Pain:  Vitals:   07/12/20 1101  TempSrc: Oral  PainSc: 0-No pain      Patients Stated Pain Goal: 4 (94/32/00 3794)  Complications: No complications documented.

## 2020-07-12 NOTE — Progress Notes (Signed)
Assisted Dr. Miller with left, ultrasound guided, interscalene  block. Side rails up, monitors on throughout procedure. See vital signs in flow sheet. Tolerated Procedure well.  

## 2020-07-12 NOTE — Anesthesia Procedure Notes (Signed)
Anesthesia Regional Block: Interscalene brachial plexus block   Pre-Anesthetic Checklist: ,, timeout performed, Correct Patient, Correct Site, Correct Laterality, Correct Procedure, Correct Position, site marked, Risks and benefits discussed,  Surgical consent,  Pre-op evaluation,  At surgeon's request and post-op pain management  Laterality: Left  Prep: chloraprep       Needles:  Injection technique: Single-shot  Needle Type: Stimiplex     Needle Length: 9cm  Needle Gauge: 21     Additional Needles:   Procedures:,,,, ultrasound used (permanent image in chart),,,,  Narrative:  Start time: 07/12/2020 12:01 PM End time: 07/12/2020 12:06 PM Injection made incrementally with aspirations every 5 mL.  Performed by: Personally  Anesthesiologist: Lynda Rainwater, MD

## 2020-07-12 NOTE — Discharge Instructions (Signed)
Discharge Instructions after Arthroscopic Shoulder Repair   A sling has been provided for you. Remain in your sling at all times. This includes sleeping in your sling.  Use ice on the shoulder intermittently over the first 48 hours after surgery.  Pain medicine has been prescribed for you.  Use your medicine liberally over the first 48 hours, and then you can begin to taper your use. You may take Extra Strength Tylenol or Tylenol only in place of the pain pills. DO NOT take ANY nonsteroidal anti-inflammatory pain medications: Advil, Motrin, Ibuprofen, Aleve, Naproxen, or Narprosyn.  You may remove your dressing after two days. If the incision sites are still moist, place a Band-Aid over the moist site(s). Change Band-Aids daily until dry.  You may shower 5 days after surgery. The incisions CANNOT get wet prior to 5 days. Simply allow the water to wash over the site and then pat dry. Do not rub the incisions. Make sure your axilla (armpit) is completely dry after showering.  Take one aspirin a day for 2 weeks after surgery, unless you have an aspirin sensitivity/ allergy or asthma.   Please call [phone] during normal business hours or [phone] after hours for any problems. Including the following:  - excessive redness of the incisions - drainage for more than 4 days - fever of more than 101.5 F  *Please note that pain medications will not be refilled after hours or on weekends.    Post Anesthesia Home Care Instructions  Activity: Get plenty of rest for the remainder of the day. A responsible individual must stay with you for 24 hours following the procedure.  For the next 24 hours, DO NOT: -Drive a car -Paediatric nurse -Drink alcoholic beverages -Take any medication unless instructed by your physician -Make any legal decisions or sign important papers.  Meals: Start with liquid foods such as gelatin or soup. Progress to regular foods as tolerated. Avoid greasy, spicy,  heavy foods. If nausea and/or vomiting occur, drink only clear liquids until the nausea and/or vomiting subsides. Call your physician if vomiting continues.  Special Instructions/Symptoms: Your throat may feel dry or sore from the anesthesia or the breathing tube placed in your throat during surgery. If this causes discomfort, gargle with warm salt water. The discomfort should disappear within 24 hours.  If you had a scopolamine patch placed behind your ear for the management of post- operative nausea and/or vomiting:  1. The medication in the patch is effective for 72 hours, after which it should be removed.  Wrap patch in a tissue and discard in the trash. Wash hands thoroughly with soap and water. 2. You may remove the patch earlier than 72 hours if you experience unpleasant side effects which may include dry mouth, dizziness or visual disturbances. 3. Avoid touching the patch. Wash your hands with soap and water after contact with the patch.

## 2020-07-12 NOTE — Anesthesia Preprocedure Evaluation (Addendum)
Anesthesia Evaluation  Patient identified by MRN, date of birth, ID band Patient awake    Reviewed: Allergy & Precautions, NPO status , Patient's Chart, lab work & pertinent test results  History of Anesthesia Complications Negative for: history of anesthetic complications  Airway Mallampati: I  TM Distance: >3 FB Neck ROM: Full    Dental no notable dental hx.    Pulmonary sleep apnea , neg COPD,    breath sounds clear to auscultation- rhonchi (-) wheezing      Cardiovascular hypertension, Pt. on medications (-) CAD, (-) Past MI and (-) Cardiac Stents  Rhythm:Regular Rate:Normal - Systolic murmurs and - Diastolic murmurs    Neuro/Psych PSYCHIATRIC DISORDERS Depression negative neurological ROS     GI/Hepatic Neg liver ROS, GERD  ,  Endo/Other  diabetesHypothyroidism   Renal/GU negative Renal ROS     Musculoskeletal negative musculoskeletal ROS (+)   Abdominal (+) + obese,   Peds  Hematology negative hematology ROS (+)   Anesthesia Other Findings Past Medical History: No date: Back pain No date: Cancer (HCC)     Comment:  BASAL CELL SKIN CANCER No date: Depression No date: Fatty liver No date: GERD (gastroesophageal reflux disease) No date: Hashimoto's thyroiditis No date: HTN (hypertension) No date: Hyperlipidemia No date: Hypothyroidism No date: IBS (irritable bowel syndrome) No date: Joint pain No date: Lactose intolerance No date: Lichen sclerosus No date: Obesity No date: Psoriasis No date: Sleep apnea   Reproductive/Obstetrics                             Anesthesia Physical  Anesthesia Plan  ASA: III  Anesthesia Plan: General   Post-op Pain Management:  Regional for Post-op pain   Induction: Intravenous  PONV Risk Score and Plan: 3 and Ondansetron, Dexamethasone, Midazolam and Treatment may vary due to age or medical condition  Airway Management Planned: Oral  ETT  Additional Equipment:   Intra-op Plan:   Post-operative Plan: Extubation in OR  Informed Consent: I have reviewed the patients History and Physical, chart, labs and discussed the procedure including the risks, benefits and alternatives for the proposed anesthesia with the patient or authorized representative who has indicated his/her understanding and acceptance.     Dental advisory given  Plan Discussed with: CRNA and Anesthesiologist  Anesthesia Plan Comments:         Anesthesia Quick Evaluation

## 2020-07-12 NOTE — Anesthesia Postprocedure Evaluation (Signed)
Anesthesia Post Note  Patient: Katherine Smith  Procedure(s) Performed: SHOULDER ARTHROSCOPY WITH ARTHROSCOPIC  ROTATOR CUFF REPAIR AND SUBACROMIAL DECOMPRESSION (Left Shoulder)     Patient location during evaluation: PACU Anesthesia Type: General Level of consciousness: awake and alert Pain management: pain level controlled Vital Signs Assessment: post-procedure vital signs reviewed and stable Respiratory status: spontaneous breathing, nonlabored ventilation and respiratory function stable Cardiovascular status: blood pressure returned to baseline and stable Postop Assessment: no apparent nausea or vomiting Anesthetic complications: no   No complications documented.  Last Vitals:  Vitals:   07/12/20 1515 07/12/20 1528  BP: (!) 117/56 127/65  Pulse: 78 82  Resp: 14 18  Temp:  36.6 C  SpO2: 93% 94%    Last Pain:  Vitals:   07/12/20 1515  TempSrc:   PainSc: 0-No pain                 Lynda Rainwater

## 2020-07-13 ENCOUNTER — Encounter (HOSPITAL_BASED_OUTPATIENT_CLINIC_OR_DEPARTMENT_OTHER): Payer: Self-pay | Admitting: Orthopedic Surgery

## 2020-07-15 NOTE — Progress Notes (Signed)
Chief Complaint:   OBESITY Katherine Smith is here to discuss her progress with her obesity treatment plan along with follow-up of her obesity related diagnoses. Katherine Smith is on keeping a food journal and adhering to recommended goals of 1300-1500 calories and 80+ grams of protein daily and states she is following her eating plan approximately 50% of the time. Katherine Smith states she is doing 0 minutes 0 times per week.  Today's visit was #: 75 Starting weight: 240 lbs Starting date: 08/10/2016 Today's weight: 194 lbs Today's date: 07/07/2020 Total lbs lost to date: 46 Total lbs lost since last in-office visit: 0  Interim History: Katherine Smith has been on vacation and she did more eating out. She was mindful of her food choices however. She is getting ready to have rotation cuff surgery.  Subjective:   1. Vitamin D deficiency Katherine Smith is stable on Vit D, and she is due to have labs checked.  2. Other hyperlipidemia Katherine Smith is working on diet and weight loss. She is due for labs.  3. Insulin resistance Katherine Smith is stable on Synthroid, and she is due to have labs checked.  4. B12 nutritional deficiency Katherine Smith is at risk for Vit B deficiency. She is due to have labs checked.  5. Other specified hypothyroidism Katherine Smith  Is stable on Synthroid, and she is due to have labs checked.  6. Other depression with emotional eating Katherine Smith is tolerating Lexapro well, and she is doing well minimizing emotional eating behaviors.   7. At risk for constipation Katherine Smith is at increased risk for constipation due to post-op pain medications.   Assessment/Plan:   1. Vitamin D deficiency Low Vitamin D level contributes to fatigue and are associated with obesity, breast, and colon cancer. We will check labs today, and we will refill prescription Vitamin D for 1 month. Vern will follow-up for routine testing of Vitamin D, at least 2-3 times per year to avoid over-replacement.  - VITAMIN D 25 Hydroxy (Vit-D Deficiency,  Fractures) - Vitamin D, Ergocalciferol, (DRISDOL) 1.25 MG (50000 UNIT) CAPS capsule; Take 1 capsule (50,000 Units total) by mouth every 7 (seven) days.  Dispense: 4 capsule; Refill: 0 - CBC with Differential/Platelet  2. Other hyperlipidemia Cardiovascular risk and specific lipid/LDL goals reviewed. We discussed several lifestyle modifications today and Aneeka will continue to work on diet, exercise and weight loss efforts. We will check labs today. Orders and follow up as documented in patient record.   - Lipid Panel With LDL/HDL Ratio  3. Insulin resistance Ivalee will continue to work on weight loss, exercise, and decreasing simple carbohydrates to help decrease the risk of diabetes. We will check labs today. Abrianna agreed to follow-up with [place] as directed to closely monitor her progress.  - Comprehensive metabolic panel - Hemoglobin A1c - Insulin, random  4. B12 nutritional deficiency The diagnosis was reviewed with the patient. We will check labs today. We will continue to monitor. Orders and follow up as documented in patient record.  - Vitamin B12  5. Other specified hypothyroidism Patient with long-standing hypothyroidism, on levothyroxine therapy. We will check labs today. Zyair appears euthyroid. Orders and follow up as documented in patient record.  Counseling . Good thyroid control is important for overall health. Supratherapeutic thyroid levels are dangerous and will not improve weight loss results. . The correct way to take levothyroxine is fasting, with water, separated by at least 30 minutes from breakfast, and separated by more than 4 hours from calcium, iron, multivitamins, acid reflux medications (PPIs).   -  T3 - T4, free - TSH  6. Other depression with emotional eating Behavior modification techniques were discussed today to help Katherine Smith deal with her emotional/non-hunger eating behaviors. We will refill Lexapro for 1 month. Orders and follow up as documented in  patient record.   - escitalopram (LEXAPRO) 20 MG tablet; Take 1 tablet (20 mg total) by mouth every morning.  Dispense: 30 tablet; Refill: 0  7. At risk for constipation Katherine Smith was given approximately 15 minutes of counseling today regarding prevention of constipation. She was recommended to start OTC miralax to prevent constipation, and to increase water.   8. Class 1 obesity with serious comorbidity and body mass index (BMI) of 34.0 to 34.9 in adult, unspecified obesity type Katherine Smith is currently in the action stage of change. As such, her goal is to continue with weight loss efforts. She has agreed to keeping a food journal and adhering to recommended goals of 1300-1500 calories and 80+ grams of protein daily.   Behavioral modification strategies: holiday eating strategies .  Katherine Smith has agreed to follow-up with our clinic in 4 weeks. She was informed of the importance of frequent follow-up visits to maximize her success with intensive lifestyle modifications for her multiple health conditions.   Katherine Smith was informed we would discuss her lab results at her next visit unless there is a critical issue that needs to be addressed sooner. Katherine Smith agreed to keep her next visit at the agreed upon time to discuss these results.  Objective:   Blood pressure 118/81, pulse 74, temperature 98 F (36.7 C), height 5\' 3"  (1.6 m), weight 194 lb (88 kg), last menstrual period 04/14/2016, SpO2 97 %. Body mass index is 34.37 kg/m.  General: Cooperative, alert, well developed, in no acute distress. HEENT: Conjunctivae and lids unremarkable. Cardiovascular: Regular rhythm.  Lungs: Normal work of breathing. Neurologic: No focal deficits.   Lab Results  Component Value Date   CREATININE 0.93 07/07/2020   BUN 17 07/07/2020   NA 144 07/07/2020   K 4.1 07/07/2020   CL 104 07/07/2020   CO2 25 07/07/2020   Lab Results  Component Value Date   ALT 16 07/07/2020   AST 19 07/07/2020   ALKPHOS 88 07/07/2020     BILITOT 0.5 07/07/2020   Lab Results  Component Value Date   HGBA1C 5.2 07/07/2020   HGBA1C 4.9 02/25/2020   HGBA1C 5.2 08/05/2019   HGBA1C 5.1 11/23/2016   HGBA1C 5.3 08/10/2016   Lab Results  Component Value Date   INSULIN 13.9 07/07/2020   INSULIN 19.3 02/25/2020   INSULIN 16.3 08/05/2019   INSULIN 25.8 (H) 05/08/2019   INSULIN 46.2 (H) 10/02/2018   Lab Results  Component Value Date   TSH 1.340 07/07/2020   Lab Results  Component Value Date   CHOL 254 (H) 07/07/2020   HDL 60 07/07/2020   LDLCALC 174 (H) 07/07/2020   TRIG 115 07/07/2020   Lab Results  Component Value Date   WBC 5.5 07/07/2020   HGB 13.5 07/07/2020   HCT 41.1 07/07/2020   MCV 85 07/07/2020   PLT 297 07/07/2020   No results found for: IRON, TIBC, FERRITIN  Attestation Statements:   Reviewed by clinician on day of visit: allergies, medications, problem list, medical history, surgical history, family history, social history, and previous encounter notes.   I, Trixie Dredge, am acting as transcriptionist for Dennard Nip, MD.  I have reviewed the above documentation for accuracy and completeness, and I agree with the above. -  Dennard Nip, MD

## 2020-07-19 DIAGNOSIS — Z9889 Other specified postprocedural states: Secondary | ICD-10-CM | POA: Diagnosis not present

## 2020-07-27 DIAGNOSIS — M25512 Pain in left shoulder: Secondary | ICD-10-CM | POA: Diagnosis not present

## 2020-08-02 ENCOUNTER — Ambulatory Visit (INDEPENDENT_AMBULATORY_CARE_PROVIDER_SITE_OTHER): Payer: 59 | Admitting: Family Medicine

## 2020-08-04 DIAGNOSIS — M25512 Pain in left shoulder: Secondary | ICD-10-CM | POA: Diagnosis not present

## 2020-08-09 ENCOUNTER — Other Ambulatory Visit (INDEPENDENT_AMBULATORY_CARE_PROVIDER_SITE_OTHER): Payer: Self-pay | Admitting: Family Medicine

## 2020-08-09 ENCOUNTER — Encounter (INDEPENDENT_AMBULATORY_CARE_PROVIDER_SITE_OTHER): Payer: Self-pay

## 2020-08-09 DIAGNOSIS — M25512 Pain in left shoulder: Secondary | ICD-10-CM | POA: Diagnosis not present

## 2020-08-09 DIAGNOSIS — F3289 Other specified depressive episodes: Secondary | ICD-10-CM

## 2020-08-09 NOTE — Telephone Encounter (Signed)
Patient will be out before next OV, please advise about refill

## 2020-08-09 NOTE — Telephone Encounter (Signed)
Message sent to pt.

## 2020-08-10 ENCOUNTER — Other Ambulatory Visit: Payer: Self-pay | Admitting: Family Medicine

## 2020-08-11 ENCOUNTER — Other Ambulatory Visit: Payer: Self-pay | Admitting: Family Medicine

## 2020-08-12 DIAGNOSIS — M25512 Pain in left shoulder: Secondary | ICD-10-CM | POA: Diagnosis not present

## 2020-08-16 DIAGNOSIS — M25512 Pain in left shoulder: Secondary | ICD-10-CM | POA: Diagnosis not present

## 2020-08-16 NOTE — Telephone Encounter (Signed)
This was sent to me by accident

## 2020-08-18 ENCOUNTER — Ambulatory Visit (INDEPENDENT_AMBULATORY_CARE_PROVIDER_SITE_OTHER): Payer: 59 | Admitting: Family Medicine

## 2020-08-18 ENCOUNTER — Other Ambulatory Visit (INDEPENDENT_AMBULATORY_CARE_PROVIDER_SITE_OTHER): Payer: Self-pay | Admitting: Family Medicine

## 2020-08-18 ENCOUNTER — Encounter (INDEPENDENT_AMBULATORY_CARE_PROVIDER_SITE_OTHER): Payer: Self-pay | Admitting: Family Medicine

## 2020-08-18 ENCOUNTER — Other Ambulatory Visit: Payer: Self-pay

## 2020-08-18 VITALS — BP 109/72 | HR 87 | Temp 97.8°F | Ht 63.0 in | Wt 199.0 lb

## 2020-08-18 DIAGNOSIS — E559 Vitamin D deficiency, unspecified: Secondary | ICD-10-CM | POA: Diagnosis not present

## 2020-08-18 DIAGNOSIS — Z6835 Body mass index (BMI) 35.0-35.9, adult: Secondary | ICD-10-CM | POA: Diagnosis not present

## 2020-08-18 DIAGNOSIS — Z9189 Other specified personal risk factors, not elsewhere classified: Secondary | ICD-10-CM

## 2020-08-18 DIAGNOSIS — F3289 Other specified depressive episodes: Secondary | ICD-10-CM | POA: Diagnosis not present

## 2020-08-18 MED ORDER — VITAMIN D (ERGOCALCIFEROL) 1.25 MG (50000 UNIT) PO CAPS: 50000.0000 [IU] | ORAL_CAPSULE | ORAL | 0 refills | Status: DC

## 2020-08-19 DIAGNOSIS — M25512 Pain in left shoulder: Secondary | ICD-10-CM | POA: Diagnosis not present

## 2020-08-19 NOTE — Progress Notes (Signed)
Chief Complaint:   OBESITY Katherine Smith is here to discuss her progress with her obesity treatment plan along with follow-up of her obesity related diagnoses. Katherine Smith is on keeping a food journal and adhering to recommended goals of 1300-1500 calories and 80+ grams of protein daily and states she is following her eating plan approximately 40% of the time. Katherine Smith states she is doing 0 minutes 0 times per week.  Today's visit was #: 43 Starting weight: 240 lbs Starting date: 08/10/2016 Today's weight: 199 lbs Today's date: 08/18/2020 Total lbs lost to date: 41 Total lbs lost since last in-office visit: 0  Interim History: Katherine Smith gained a bit of weight while recovering. She is ready to get back on track and would like to restart with her Category 3 plan.  Subjective:   1. Vitamin D deficiency Katherine Smith is stable on Vit D, and she denies nausea or vomiting. She requests a refill today.  2. Other depression with emotional eating Katherine Smith is stable on Lexapro even with holiday stress and stress from recovering from surgery. Her mood is appropriate.  3. At risk for impaired metabolic function Katherine Smith is at increased risk for impaired metabolic function due to decreased activity.  Assessment/Plan:   1. Vitamin D deficiency Low Vitamin D level contributes to fatigue and are associated with obesity, breast, and colon cancer. We will refill prescription Vitamin D for 1 month. Katherine Smith will follow-up for routine testing of Vitamin D, at least 2-3 times per year to avoid over-replacement.  - Vitamin D, Ergocalciferol, (DRISDOL) 1.25 MG (50000 UNIT) CAPS capsule; Take 1 capsule (50,000 Units total) by mouth every 7 (seven) days.  Dispense: 4 capsule; Refill: 0  2. Other depression with emotional eating Emotional eating strategies were discussed today to help Katherine Smith deal with her emotional/non-hunger eating behaviors.  Orders and follow up as documented in patient record.   3. At risk for impaired  metabolic function Katherine Smith was given approximately 15 minutes of impaired  metabolic function prevention counseling today. We discussed intensive lifestyle modifications today with an emphasis on specific nutrition and exercise instructions and strategies.   Repetitive spaced learning was employed today to elicit superior memory formation and behavioral change.  4. Class 2 severe obesity with serious comorbidity and body mass index (BMI) of 35.0 to 35.9 in adult, unspecified obesity type Katherine Smith) Katherine Smith is currently in the action stage of change. As such, her goal is to continue with weight loss efforts. She has agreed to get back to the Category 3 Plan.   Behavioral modification strategies: increasing lean protein intake.  Katherine Smith has agreed to follow-up with our clinic in 3 weeks. She was informed of the importance of frequent follow-up visits to maximize her success with intensive lifestyle modifications for her multiple health conditions.   Objective:   Blood pressure 109/72, pulse 87, temperature 97.8 F (36.6 C), height 5\' 3"  (1.6 m), weight 199 lb (90.3 kg), last menstrual period 04/14/2016, SpO2 96 %. Body mass index is 35.25 kg/m.  General: Cooperative, alert, well developed, in no acute distress. HEENT: Conjunctivae and lids unremarkable. Cardiovascular: Regular rhythm.  Lungs: Normal work of breathing. Neurologic: No focal deficits.   Lab Results  Component Value Date   CREATININE 0.93 07/07/2020   BUN 17 07/07/2020   NA 144 07/07/2020   K 4.1 07/07/2020   CL 104 07/07/2020   CO2 25 07/07/2020   Lab Results  Component Value Date   ALT 16 07/07/2020   AST 19 07/07/2020  ALKPHOS 88 07/07/2020   BILITOT 0.5 07/07/2020   Lab Results  Component Value Date   HGBA1C 5.2 07/07/2020   HGBA1C 4.9 02/25/2020   HGBA1C 5.2 08/05/2019   HGBA1C 5.1 11/23/2016   HGBA1C 5.3 08/10/2016   Lab Results  Component Value Date   INSULIN 13.9 07/07/2020   INSULIN 19.3 02/25/2020    INSULIN 16.3 08/05/2019   INSULIN 25.8 (H) 05/08/2019   INSULIN 46.2 (H) 10/02/2018   Lab Results  Component Value Date   TSH 1.340 07/07/2020   Lab Results  Component Value Date   CHOL 254 (H) 07/07/2020   HDL 60 07/07/2020   LDLCALC 174 (H) 07/07/2020   TRIG 115 07/07/2020   Lab Results  Component Value Date   WBC 5.5 07/07/2020   HGB 13.5 07/07/2020   HCT 41.1 07/07/2020   MCV 85 07/07/2020   PLT 297 07/07/2020   No results found for: IRON, TIBC, FERRITIN  Attestation Statements:   Reviewed by clinician on day of visit: allergies, medications, problem list, medical history, surgical history, family history, social history, and previous encounter notes.   I, Trixie Dredge, am acting as transcriptionist for Dennard Nip, MD.  I have reviewed the above documentation for accuracy and completeness, and I agree with the above. -  Dennard Nip, MD

## 2020-08-23 DIAGNOSIS — M25512 Pain in left shoulder: Secondary | ICD-10-CM | POA: Diagnosis not present

## 2020-08-25 DIAGNOSIS — M25512 Pain in left shoulder: Secondary | ICD-10-CM | POA: Diagnosis not present

## 2020-08-25 DIAGNOSIS — E782 Mixed hyperlipidemia: Secondary | ICD-10-CM | POA: Diagnosis not present

## 2020-08-25 DIAGNOSIS — E039 Hypothyroidism, unspecified: Secondary | ICD-10-CM | POA: Diagnosis not present

## 2020-09-01 ENCOUNTER — Other Ambulatory Visit: Payer: Self-pay | Admitting: Surgical

## 2020-09-01 DIAGNOSIS — E782 Mixed hyperlipidemia: Secondary | ICD-10-CM | POA: Diagnosis not present

## 2020-09-01 DIAGNOSIS — F411 Generalized anxiety disorder: Secondary | ICD-10-CM | POA: Insufficient documentation

## 2020-09-01 DIAGNOSIS — M25512 Pain in left shoulder: Secondary | ICD-10-CM | POA: Diagnosis not present

## 2020-09-01 DIAGNOSIS — Z8679 Personal history of other diseases of the circulatory system: Secondary | ICD-10-CM | POA: Diagnosis not present

## 2020-09-01 DIAGNOSIS — E039 Hypothyroidism, unspecified: Secondary | ICD-10-CM | POA: Diagnosis not present

## 2020-09-06 DIAGNOSIS — M25512 Pain in left shoulder: Secondary | ICD-10-CM | POA: Diagnosis not present

## 2020-09-08 ENCOUNTER — Other Ambulatory Visit: Payer: Self-pay

## 2020-09-08 ENCOUNTER — Ambulatory Visit (INDEPENDENT_AMBULATORY_CARE_PROVIDER_SITE_OTHER): Payer: 59 | Admitting: Family Medicine

## 2020-09-08 ENCOUNTER — Other Ambulatory Visit (INDEPENDENT_AMBULATORY_CARE_PROVIDER_SITE_OTHER): Payer: Self-pay | Admitting: Family Medicine

## 2020-09-08 VITALS — BP 101/63 | HR 71 | Temp 97.7°F | Ht 63.0 in | Wt 197.0 lb

## 2020-09-08 DIAGNOSIS — Z9189 Other specified personal risk factors, not elsewhere classified: Secondary | ICD-10-CM | POA: Diagnosis not present

## 2020-09-08 DIAGNOSIS — E559 Vitamin D deficiency, unspecified: Secondary | ICD-10-CM | POA: Diagnosis not present

## 2020-09-08 DIAGNOSIS — Z6834 Body mass index (BMI) 34.0-34.9, adult: Secondary | ICD-10-CM

## 2020-09-08 DIAGNOSIS — F3289 Other specified depressive episodes: Secondary | ICD-10-CM | POA: Diagnosis not present

## 2020-09-08 DIAGNOSIS — M25512 Pain in left shoulder: Secondary | ICD-10-CM | POA: Diagnosis not present

## 2020-09-08 DIAGNOSIS — E669 Obesity, unspecified: Secondary | ICD-10-CM

## 2020-09-08 MED ORDER — ESCITALOPRAM OXALATE 20 MG PO TABS: 20.0000 mg | ORAL_TABLET | ORAL | 0 refills | Status: DC

## 2020-09-08 MED ORDER — VITAMIN D (ERGOCALCIFEROL) 1.25 MG (50000 UNIT) PO CAPS: 50000.0000 [IU] | ORAL_CAPSULE | ORAL | 0 refills | Status: DC

## 2020-09-08 NOTE — Progress Notes (Signed)
Chief Complaint:   OBESITY Katherine Smith is here to discuss her progress with her obesity treatment plan along with follow-up of her obesity related diagnoses. Katherine Smith is on the Category 3 Plan and states she is following her eating plan approximately 85% of the time. Katherine Smith states she is doing 0 minutes 0 times per week.  Today's visit was #: 48 Starting weight: 240 lbs Starting date: 08/10/2016 Today's weight: 197 lbs Today's date: 09/08/2020 Total lbs lost to date: 43 Total lbs lost since last in-office visit: 2  Interim History: Katherine Smith continues to do well with weight loss since her last visit. She is on Saxenda at 2.4 mg, and she notes decreased hunger. She is doing physical therapy for exercise currently.  Subjective:   1. Vitamin D deficiency Katherine Smith is stable on Vit D, and she requests a refill today.  2. Other depression with emotional eating Katherine Smith has had  Increased stress at work with longer hours and COVID positive co-workers. She has had less time and motivation to exercise.  3. At risk for impaired metabolic function Katherine Smith is at increased risk for impaired metabolic function due to decreased protein and exercise.  Assessment/Plan:   1. Vitamin D deficiency Low Vitamin D level contributes to fatigue and are associated with obesity, breast, and colon cancer. We will refill prescription Vitamin D for 1 month. Katherine Smith will follow-up for routine testing of Vitamin D, at least 2-3 times per year to avoid over-replacement.  - Vitamin D, Ergocalciferol, (DRISDOL) 1.25 MG (50000 UNIT) CAPS capsule; Take 1 capsule (50,000 Units total) by mouth every 7 (seven) days.  Dispense: 4 capsule; Refill: 0  2. Other depression with emotional eating Behavior modification techniques were discussed today to help Katherine Smith deal with her emotional/non-hunger eating behaviors. We will refill Lexapro for 1 month, and she was offered support and encouragement. Orders and follow up as documented in  patient record.   - escitalopram (LEXAPRO) 20 MG tablet; Take 1 tablet (20 mg total) by mouth every morning.  Dispense: 30 tablet; Refill: 0  3. At risk for impaired metabolic function Katherine Smith was given approximately 15 minutes of impaired  metabolic function prevention counseling today. We discussed intensive lifestyle modifications today with an emphasis on specific nutrition and exercise instructions and strategies.   Repetitive spaced learning was employed today to elicit superior memory formation and behavioral change.  4. Class 1 obesity with serious comorbidity and body mass index (BMI) of 34.0 to 34.9 in adult, unspecified obesity type Katherine Smith is currently in the action stage of change. As such, her goal is to continue with weight loss efforts. She has agreed to the Category 3 Plan or keeping a food journal and adhering to recommended goals of 1500 calories and 85 grams of protein daily.   Exercise goals: As is.  Behavioral modification strategies: increasing lean protein intake and meal planning and cooking strategies.  Katherine Smith has agreed to follow-up with our clinic in 3 to 4 weeks. She was informed of the importance of frequent follow-up visits to maximize her success with intensive lifestyle modifications for her multiple health conditions.   Objective:   Blood pressure 101/63, pulse 71, temperature 97.7 F (36.5 C), height 5\' 3"  (1.6 m), weight 197 lb (89.4 kg), last menstrual period 04/14/2016, SpO2 97 %. Body mass index is 34.9 kg/m.  General: Cooperative, alert, well developed, in no acute distress. HEENT: Conjunctivae and lids unremarkable. Cardiovascular: Regular rhythm.  Lungs: Normal work of breathing. Neurologic: No focal deficits.  Lab Results  Component Value Date   CREATININE 0.93 07/07/2020   BUN 17 07/07/2020   NA 144 07/07/2020   K 4.1 07/07/2020   CL 104 07/07/2020   CO2 25 07/07/2020   Lab Results  Component Value Date   ALT 16 07/07/2020   AST 19  07/07/2020   ALKPHOS 88 07/07/2020   BILITOT 0.5 07/07/2020   Lab Results  Component Value Date   HGBA1C 5.2 07/07/2020   HGBA1C 4.9 02/25/2020   HGBA1C 5.2 08/05/2019   HGBA1C 5.1 11/23/2016   HGBA1C 5.3 08/10/2016   Lab Results  Component Value Date   INSULIN 13.9 07/07/2020   INSULIN 19.3 02/25/2020   INSULIN 16.3 08/05/2019   INSULIN 25.8 (H) 05/08/2019   INSULIN 46.2 (H) 10/02/2018   Lab Results  Component Value Date   TSH 1.340 07/07/2020   Lab Results  Component Value Date   CHOL 254 (H) 07/07/2020   HDL 60 07/07/2020   LDLCALC 174 (H) 07/07/2020   TRIG 115 07/07/2020   Lab Results  Component Value Date   WBC 5.5 07/07/2020   HGB 13.5 07/07/2020   HCT 41.1 07/07/2020   MCV 85 07/07/2020   PLT 297 07/07/2020   No results found for: IRON, TIBC, FERRITIN  Attestation Statements:   Reviewed by clinician on day of visit: allergies, medications, problem list, medical history, surgical history, family history, social history, and previous encounter notes.   I, Katherine Smith, am acting as transcriptionist for Katherine Nip, MD.  I have reviewed the above documentation for accuracy and completeness, and I agree with the above. -  Katherine Nip, MD

## 2020-09-13 DIAGNOSIS — M25512 Pain in left shoulder: Secondary | ICD-10-CM | POA: Diagnosis not present

## 2020-09-15 DIAGNOSIS — M25512 Pain in left shoulder: Secondary | ICD-10-CM | POA: Diagnosis not present

## 2020-09-22 DIAGNOSIS — M25512 Pain in left shoulder: Secondary | ICD-10-CM | POA: Diagnosis not present

## 2020-09-24 DIAGNOSIS — M25512 Pain in left shoulder: Secondary | ICD-10-CM | POA: Diagnosis not present

## 2020-09-27 DIAGNOSIS — M25512 Pain in left shoulder: Secondary | ICD-10-CM | POA: Diagnosis not present

## 2020-09-29 ENCOUNTER — Other Ambulatory Visit (INDEPENDENT_AMBULATORY_CARE_PROVIDER_SITE_OTHER): Payer: Self-pay | Admitting: Family Medicine

## 2020-09-29 ENCOUNTER — Other Ambulatory Visit: Payer: Self-pay

## 2020-09-29 ENCOUNTER — Ambulatory Visit (INDEPENDENT_AMBULATORY_CARE_PROVIDER_SITE_OTHER): Payer: 59 | Admitting: Family Medicine

## 2020-09-29 ENCOUNTER — Encounter (INDEPENDENT_AMBULATORY_CARE_PROVIDER_SITE_OTHER): Payer: Self-pay | Admitting: Family Medicine

## 2020-09-29 VITALS — BP 113/80 | HR 71 | Temp 97.8°F | Ht 63.0 in | Wt 194.0 lb

## 2020-09-29 DIAGNOSIS — F3289 Other specified depressive episodes: Secondary | ICD-10-CM

## 2020-09-29 DIAGNOSIS — M25512 Pain in left shoulder: Secondary | ICD-10-CM | POA: Diagnosis not present

## 2020-09-29 DIAGNOSIS — E669 Obesity, unspecified: Secondary | ICD-10-CM

## 2020-09-29 DIAGNOSIS — Z6834 Body mass index (BMI) 34.0-34.9, adult: Secondary | ICD-10-CM

## 2020-09-29 DIAGNOSIS — E559 Vitamin D deficiency, unspecified: Secondary | ICD-10-CM

## 2020-09-29 DIAGNOSIS — Z9189 Other specified personal risk factors, not elsewhere classified: Secondary | ICD-10-CM | POA: Diagnosis not present

## 2020-09-29 MED ORDER — VITAMIN D (ERGOCALCIFEROL) 1.25 MG (50000 UNIT) PO CAPS: 50000.0000 [IU] | ORAL_CAPSULE | ORAL | 0 refills | Status: DC

## 2020-09-29 MED ORDER — ESCITALOPRAM OXALATE 20 MG PO TABS: 20.0000 mg | ORAL_TABLET | ORAL | 0 refills | Status: DC

## 2020-09-29 MED ORDER — WEGOVY 1.7 MG/0.75ML ~~LOC~~ SOAJ: 1.7000 mg | SUBCUTANEOUS | Status: DC

## 2020-09-30 ENCOUNTER — Encounter (INDEPENDENT_AMBULATORY_CARE_PROVIDER_SITE_OTHER): Payer: Self-pay | Admitting: Family Medicine

## 2020-10-04 ENCOUNTER — Encounter (INDEPENDENT_AMBULATORY_CARE_PROVIDER_SITE_OTHER): Payer: Self-pay | Admitting: Family Medicine

## 2020-10-04 ENCOUNTER — Other Ambulatory Visit (INDEPENDENT_AMBULATORY_CARE_PROVIDER_SITE_OTHER): Payer: Self-pay

## 2020-10-04 ENCOUNTER — Other Ambulatory Visit (INDEPENDENT_AMBULATORY_CARE_PROVIDER_SITE_OTHER): Payer: Self-pay | Admitting: Family Medicine

## 2020-10-04 DIAGNOSIS — F3289 Other specified depressive episodes: Secondary | ICD-10-CM

## 2020-10-04 DIAGNOSIS — E669 Obesity, unspecified: Secondary | ICD-10-CM

## 2020-10-04 DIAGNOSIS — M25512 Pain in left shoulder: Secondary | ICD-10-CM | POA: Diagnosis not present

## 2020-10-04 DIAGNOSIS — Z6834 Body mass index (BMI) 34.0-34.9, adult: Secondary | ICD-10-CM

## 2020-10-04 MED ORDER — WEGOVY 1.7 MG/0.75ML ~~LOC~~ SOAJ: 1.7000 mg | SUBCUTANEOUS | Status: DC

## 2020-10-04 MED ORDER — ESCITALOPRAM OXALATE 20 MG PO TABS: 20.0000 mg | ORAL_TABLET | ORAL | 0 refills | Status: DC

## 2020-10-04 NOTE — Telephone Encounter (Signed)
Dr.Beasley 

## 2020-10-04 NOTE — Progress Notes (Signed)
Chief Complaint:   OBESITY Katherine Smith is here to discuss her progress with her obesity treatment plan along with follow-up of her obesity related diagnoses. Katherine Smith is on the Category 3 Plan or keeping a food journal and adhering to recommended goals of 1500 calories and 85 grams of protein daily and states she is following her eating plan approximately 90% of the time. Katherine Smith states she is doing physical therapy for 60 minutes 2 times per week.  Today's visit was #: 67 Starting weight: 240 lbs Starting date: 08/10/2016 Today's weight: 194 lbs Today's date: 09/29/2020 Total lbs lost to date: 46 Total lbs lost since last in-office visit: 3  Interim History: Katherine Smith continues to do well with weight los. She is journaling most days and trying to meet her protein goals. She is on Saxenda currently but she wonders of Mancel Parsons would help her more.  Subjective:   1. Vitamin D deficiency Casilda is stable on Vit D, and she denies nausea, vomiting, or muscle weakness. Her Vit D level is not yet at goal.  2. Other depression with emotional eating Katherine Smith continues to do well with minimizing emotional eating behaviors. She is busy at work but she isn't dealing with extraordinary stress.  3. At risk for diabetes mellitus Katherine Smith is at higher than average risk for developing diabetes due to obesity.   Assessment/Plan:   1. Vitamin D deficiency Low Vitamin D level contributes to fatigue and are associated with obesity, breast, and colon cancer. We will refill prescription Vitamin D for 1 month. Katherine Smith will follow-up for routine testing of Vitamin D, at least 2-3 times per year to avoid over-replacement.  - Vitamin D, Ergocalciferol, (DRISDOL) 1.25 MG (50000 UNIT) CAPS capsule; Take 1 capsule (50,000 Units total) by mouth every 7 (seven) days.  Dispense: 4 capsule; Refill: 0  2. Other depression with emotional eating Behavior modification techniques were discussed today to help Katherine Smith deal with her  emotional/non-hunger eating behaviors. We will refill Lexapro for 1 month. Orders and follow up as documented in patient record.   - escitalopram (LEXAPRO) 20 MG tablet; Take 1 tablet (20 mg total) by mouth every morning.  Dispense: 30 tablet; Refill: 0  3. At risk for diabetes mellitus Katherine Smith was given approximately 15 minutes of diabetes education and counseling today. We discussed intensive lifestyle modifications today with an emphasis on weight loss as well as increasing exercise and decreasing simple carbohydrates in her diet. We also reviewed medication options with an emphasis on risk versus benefit of those discussed.   Repetitive spaced learning was employed today to elicit superior memory formation and behavioral change.  4. Class 1 obesity with serious comorbidity and body mass index (BMI) of 34.0 to 34.9 in adult, unspecified obesity type Katherine Smith is currently in the action stage of change. As such, her goal is to continue with weight loss efforts. She has agreed to keeping a food journal and adhering to recommended goals of 1500 calories and 85+ grams of protein daily.   We discussed various medication options to help Katherine Smith with her weight loss efforts and we both agreed to discontinue Saxenda, and start Wegovy 1.7 mg q weekly with no refills.  - Semaglutide-Weight Management (WEGOVY) 1.7 MG/0.75ML SOAJ; Inject 1.7 mg into the skin once a week.  Dispense: 3 mL; Refill: 3.0  Exercise goals: As is.  Behavioral modification strategies: increasing lean protein intake and meal planning and cooking strategies.  Katherine Smith has agreed to follow-up with our clinic in 3  weeks. She was informed of the importance of frequent follow-up visits to maximize her success with intensive lifestyle modifications for her multiple health conditions.   Objective:   Blood pressure 113/80, pulse 71, temperature 97.8 F (36.6 C), height 5\' 3"  (1.6 m), weight 194 lb (88 kg), last menstrual period 04/14/2016,  SpO2 98 %. Body mass index is 34.37 kg/m.  General: Cooperative, alert, well developed, in no acute distress. HEENT: Conjunctivae and lids unremarkable. Cardiovascular: Regular rhythm.  Lungs: Normal work of breathing. Neurologic: No focal deficits.   Lab Results  Component Value Date   CREATININE 0.93 07/07/2020   BUN 17 07/07/2020   NA 144 07/07/2020   K 4.1 07/07/2020   CL 104 07/07/2020   CO2 25 07/07/2020   Lab Results  Component Value Date   ALT 16 07/07/2020   AST 19 07/07/2020   ALKPHOS 88 07/07/2020   BILITOT 0.5 07/07/2020   Lab Results  Component Value Date   HGBA1C 5.2 07/07/2020   HGBA1C 4.9 02/25/2020   HGBA1C 5.2 08/05/2019   HGBA1C 5.1 11/23/2016   HGBA1C 5.3 08/10/2016   Lab Results  Component Value Date   INSULIN 13.9 07/07/2020   INSULIN 19.3 02/25/2020   INSULIN 16.3 08/05/2019   INSULIN 25.8 (H) 05/08/2019   INSULIN 46.2 (H) 10/02/2018   Lab Results  Component Value Date   TSH 1.340 07/07/2020   Lab Results  Component Value Date   CHOL 254 (H) 07/07/2020   HDL 60 07/07/2020   LDLCALC 174 (H) 07/07/2020   TRIG 115 07/07/2020   Lab Results  Component Value Date   WBC 5.5 07/07/2020   HGB 13.5 07/07/2020   HCT 41.1 07/07/2020   MCV 85 07/07/2020   PLT 297 07/07/2020   No results found for: IRON, TIBC, FERRITIN  Attestation Statements:   Reviewed by clinician on day of visit: allergies, medications, problem list, medical history, surgical history, family history, social history, and previous encounter notes.   I, Trixie Dredge, am acting as transcriptionist for Dennard Nip, MD.  I have reviewed the above documentation for accuracy and completeness, and I agree with the above. -  Dennard Nip, MD

## 2020-10-05 ENCOUNTER — Telehealth (INDEPENDENT_AMBULATORY_CARE_PROVIDER_SITE_OTHER): Payer: Self-pay

## 2020-10-05 NOTE — Telephone Encounter (Signed)
PA has been initiated via [email or web address] for [employer].   Key: [id number] Rx #: [id number] 1.7MG /0.75ML auto-injectors   Form: Med[employer] ePA Form 2017 NCPDP Determination: Wait for Determination Please wait for Med[employer] 2017 to return a determination.

## 2020-10-06 ENCOUNTER — Ambulatory Visit
Admission: RE | Admit: 2020-10-06 | Discharge: 2020-10-06 | Disposition: A | Discharge status: Home / Self Care | Payer: 59 | Source: Ambulatory Visit | Attending: Obstetrics and Gynecology | Admitting: Obstetrics and Gynecology

## 2020-10-06 ENCOUNTER — Other Ambulatory Visit: Payer: Self-pay

## 2020-10-06 DIAGNOSIS — M25512 Pain in left shoulder: Secondary | ICD-10-CM | POA: Diagnosis not present

## 2020-10-06 DIAGNOSIS — Z1231 Encounter for screening mammogram for malignant neoplasm of breast: Secondary | ICD-10-CM | POA: Diagnosis not present

## 2020-10-11 DIAGNOSIS — M25512 Pain in left shoulder: Secondary | ICD-10-CM | POA: Diagnosis not present

## 2020-10-13 DIAGNOSIS — M25512 Pain in left shoulder: Secondary | ICD-10-CM | POA: Diagnosis not present

## 2020-10-18 DIAGNOSIS — G4733 Obstructive sleep apnea (adult) (pediatric): Secondary | ICD-10-CM | POA: Diagnosis not present

## 2020-10-18 DIAGNOSIS — I10 Essential (primary) hypertension: Secondary | ICD-10-CM | POA: Diagnosis not present

## 2020-10-18 DIAGNOSIS — M25512 Pain in left shoulder: Secondary | ICD-10-CM | POA: Diagnosis not present

## 2020-10-20 DIAGNOSIS — M25512 Pain in left shoulder: Secondary | ICD-10-CM | POA: Diagnosis not present

## 2020-10-27 ENCOUNTER — Encounter (INDEPENDENT_AMBULATORY_CARE_PROVIDER_SITE_OTHER): Payer: Self-pay | Admitting: Family Medicine

## 2020-10-27 ENCOUNTER — Ambulatory Visit (INDEPENDENT_AMBULATORY_CARE_PROVIDER_SITE_OTHER): Payer: 59 | Admitting: Family Medicine

## 2020-10-27 ENCOUNTER — Other Ambulatory Visit: Payer: Self-pay

## 2020-10-27 ENCOUNTER — Other Ambulatory Visit (INDEPENDENT_AMBULATORY_CARE_PROVIDER_SITE_OTHER): Payer: Self-pay | Admitting: Family Medicine

## 2020-10-27 VITALS — BP 110/70 | HR 77 | Temp 97.7°F | Ht 63.0 in | Wt 191.0 lb

## 2020-10-27 DIAGNOSIS — E559 Vitamin D deficiency, unspecified: Secondary | ICD-10-CM

## 2020-10-27 DIAGNOSIS — F3289 Other specified depressive episodes: Secondary | ICD-10-CM

## 2020-10-27 DIAGNOSIS — Z9189 Other specified personal risk factors, not elsewhere classified: Secondary | ICD-10-CM

## 2020-10-27 DIAGNOSIS — E669 Obesity, unspecified: Secondary | ICD-10-CM

## 2020-10-27 DIAGNOSIS — M25512 Pain in left shoulder: Secondary | ICD-10-CM | POA: Diagnosis not present

## 2020-10-27 DIAGNOSIS — Z6833 Body mass index (BMI) 33.0-33.9, adult: Secondary | ICD-10-CM | POA: Diagnosis not present

## 2020-10-27 MED ORDER — ESCITALOPRAM OXALATE 20 MG PO TABS: 20.0000 mg | ORAL_TABLET | ORAL | 0 refills | Status: DC

## 2020-10-27 MED ORDER — WEGOVY 1.7 MG/0.75ML ~~LOC~~ SOAJ: 1.7000 mg | SUBCUTANEOUS | Status: DC

## 2020-10-28 ENCOUNTER — Other Ambulatory Visit: Payer: Self-pay | Admitting: Family Medicine

## 2020-10-29 ENCOUNTER — Other Ambulatory Visit (INDEPENDENT_AMBULATORY_CARE_PROVIDER_SITE_OTHER): Payer: Self-pay | Admitting: Family Medicine

## 2020-10-29 DIAGNOSIS — H43813 Vitreous degeneration, bilateral: Secondary | ICD-10-CM | POA: Diagnosis not present

## 2020-10-29 DIAGNOSIS — M25512 Pain in left shoulder: Secondary | ICD-10-CM | POA: Diagnosis not present

## 2020-10-29 DIAGNOSIS — E559 Vitamin D deficiency, unspecified: Secondary | ICD-10-CM

## 2020-10-29 DIAGNOSIS — E669 Obesity, unspecified: Secondary | ICD-10-CM

## 2020-11-01 ENCOUNTER — Other Ambulatory Visit (INDEPENDENT_AMBULATORY_CARE_PROVIDER_SITE_OTHER): Payer: Self-pay | Admitting: Family Medicine

## 2020-11-01 DIAGNOSIS — M25512 Pain in left shoulder: Secondary | ICD-10-CM | POA: Diagnosis not present

## 2020-11-01 NOTE — Telephone Encounter (Signed)
Yes, send refill for 1 month, no refills

## 2020-11-01 NOTE — Telephone Encounter (Signed)
Would you like to refill pt's Vitamin D.

## 2020-11-01 NOTE — Telephone Encounter (Signed)
Please advise if refill is appropriate in Dr. Migdalia Dk absence.

## 2020-11-02 NOTE — Progress Notes (Unsigned)
Chief Complaint:   OBESITY Katherine Smith is here to discuss her progress with her obesity treatment plan along with follow-up of her obesity related diagnoses. Katherine Smith is on keeping a food journal and adhering to recommended goals of 1500 calories and 85+ grams of protein daily and states she is following her eating plan approximately 80% of the time. Katherine Smith states she is walking and doing physical therapy for 20-30 minutes 2 times per week.  Today's visit was #: 90 Starting weight: 240 lbs Starting date: 08/10/2016 Today's weight: 191 lbs Today's date: 10/27/2020 Total lbs lost to date: 49 Total lbs lost since last in-office visit: 3  Interim History: Katherine Smith continues to do well with weight loss. She is tolerating Wegovy well overall, but she notes increased nausea on the first 1-2 days after her injection. She would like to continue this for now, and see if this resolves with time.  Subjective:   1. Vitamin D deficiency Katherine Smith is stable on Vit D weekly prescription. She denies signs of over-replacement.  2. Other depression with emotional eating Katherine Smith's mood is stable on Lexapro. She notes sleep is not as good due to left shoulder and arm pain.  3. At risk for osteoporosis Katherine Smith is at higher risk of osteopenia and osteoporosis due to Vitamin D deficiency.   Assessment/Plan:   1. Vitamin D deficiency Low Vitamin D level contributes to fatigue and are associated with obesity, breast, and colon cancer. Katherine Smith agreed to continue taking prescription Vitamin D 50,000 IU every week as is, and will follow-up for routine testing of Vitamin D, at least 2-3 times per year to avoid over-replacement.  2. Other depression with emotional eating Behavior modification techniques were discussed today to help Katherine Smith deal with her emotional/non-hunger eating behaviors. We will refill Lexapro for 1 month. Orders and follow up as documented in patient record.   - escitalopram (LEXAPRO) 20 MG tablet;  Take 1 tablet (20 mg total) by mouth every morning.  Dispense: 30 tablet; Refill: 0  3. At risk for osteoporosis Katherine Smith was given approximately 15 minutes of osteoporosis prevention counseling today. Katherine Smith is at risk for osteopenia and osteoporosis due to her Vitamin D deficiency. She was encouraged to take her Vitamin D and follow her higher calcium diet and increase strengthening exercise to help strengthen her bones and decrease her risk of osteopenia and osteoporosis.  Repetitive spaced learning was employed today to elicit superior memory formation and behavioral change.  4. Class 1 obesity with serious comorbidity and body mass index (BMI) of 33.0 to 33.9 in adult, unspecified obesity type Katherine Smith is currently in the action stage of change. As such, her goal is to continue with weight loss efforts. She has agreed to keeping a food journal and adhering to recommended goals of 1300-1500 calories and 80+ grams of protein daily.   We discussed various medication options to help Katherine Smith with her weight loss efforts and we both agreed to continue Wegovy 1.7 mg q weekly, and we will refill for 1 month.  Exercise goals: As is.  Behavioral modification strategies: increasing lean protein intake and decreasing simple carbohydrates.  Katherine Smith has agreed to follow-up with our clinic in 4 weeks. She was informed of the importance of frequent follow-up visits to maximize her success with intensive lifestyle modifications for her multiple health conditions.   Objective:   Blood pressure 110/70, pulse 77, temperature 97.7 F (36.5 C), height 5\' 3"  (1.6 m), weight 191 lb (86.6 kg), last menstrual period 04/14/2016,  SpO2 98 %. Body mass index is 33.83 kg/m.  General: Cooperative, alert, well developed, in no acute distress. HEENT: Conjunctivae and lids unremarkable. Cardiovascular: Regular rhythm.  Lungs: Normal work of breathing. Neurologic: No focal deficits.   Lab Results  Component Value Date    CREATININE 0.93 07/07/2020   BUN 17 07/07/2020   NA 144 07/07/2020   K 4.1 07/07/2020   CL 104 07/07/2020   CO2 25 07/07/2020   Lab Results  Component Value Date   ALT 16 07/07/2020   AST 19 07/07/2020   ALKPHOS 88 07/07/2020   BILITOT 0.5 07/07/2020   Lab Results  Component Value Date   HGBA1C 5.2 07/07/2020   HGBA1C 4.9 02/25/2020   HGBA1C 5.2 08/05/2019   HGBA1C 5.1 11/23/2016   HGBA1C 5.3 08/10/2016   Lab Results  Component Value Date   INSULIN 13.9 07/07/2020   INSULIN 19.3 02/25/2020   INSULIN 16.3 08/05/2019   INSULIN 25.8 (H) 05/08/2019   INSULIN 46.2 (H) 10/02/2018   Lab Results  Component Value Date   TSH 1.340 07/07/2020   Lab Results  Component Value Date   CHOL 254 (H) 07/07/2020   HDL 60 07/07/2020   LDLCALC 174 (H) 07/07/2020   TRIG 115 07/07/2020   Lab Results  Component Value Date   WBC 5.5 07/07/2020   HGB 13.5 07/07/2020   HCT 41.1 07/07/2020   MCV 85 07/07/2020   PLT 297 07/07/2020   No results found for: IRON, TIBC, FERRITIN  Attestation Statements:   Reviewed by clinician on day of visit: allergies, medications, problem list, medical history, surgical history, family history, social history, and previous encounter notes.   I, Trixie Dredge, am acting as transcriptionist for Dennard Nip, MD.  I have reviewed the above documentation for accuracy and completeness, and I agree with the above. -  Dennard Nip, MD

## 2020-11-03 DIAGNOSIS — M25512 Pain in left shoulder: Secondary | ICD-10-CM | POA: Diagnosis not present

## 2020-11-10 DIAGNOSIS — M13849 Other specified arthritis, unspecified hand: Secondary | ICD-10-CM | POA: Diagnosis not present

## 2020-11-12 DIAGNOSIS — M25512 Pain in left shoulder: Secondary | ICD-10-CM | POA: Diagnosis not present

## 2020-11-15 DIAGNOSIS — M25512 Pain in left shoulder: Secondary | ICD-10-CM | POA: Diagnosis not present

## 2020-11-17 DIAGNOSIS — Z09 Encounter for follow-up examination after completed treatment for conditions other than malignant neoplasm: Secondary | ICD-10-CM | POA: Diagnosis not present

## 2020-11-22 ENCOUNTER — Other Ambulatory Visit: Payer: Self-pay

## 2020-11-22 ENCOUNTER — Ambulatory Visit: Payer: 59 | Attending: Internal Medicine

## 2020-11-22 DIAGNOSIS — M25512 Pain in left shoulder: Secondary | ICD-10-CM | POA: Diagnosis not present

## 2020-11-22 DIAGNOSIS — Z23 Encounter for immunization: Secondary | ICD-10-CM

## 2020-11-22 MED ORDER — PFIZER-BIONT COVID-19 VAC-TRIS 30 MCG/0.3ML IM SUSP
INTRAMUSCULAR | 0 refills | Status: DC
  Filled 2020-11-22: qty 0.3, 1d supply, fill #0

## 2020-11-24 DIAGNOSIS — M25512 Pain in left shoulder: Secondary | ICD-10-CM | POA: Diagnosis not present

## 2020-11-26 ENCOUNTER — Other Ambulatory Visit: Payer: Self-pay

## 2020-12-01 ENCOUNTER — Other Ambulatory Visit: Payer: Self-pay

## 2020-12-01 ENCOUNTER — Encounter (INDEPENDENT_AMBULATORY_CARE_PROVIDER_SITE_OTHER): Payer: Self-pay | Admitting: Family Medicine

## 2020-12-01 ENCOUNTER — Ambulatory Visit (INDEPENDENT_AMBULATORY_CARE_PROVIDER_SITE_OTHER): Payer: 59 | Admitting: Family Medicine

## 2020-12-01 VITALS — BP 105/72 | HR 79 | Temp 98.2°F | Ht 63.0 in | Wt 188.0 lb

## 2020-12-01 DIAGNOSIS — Z6841 Body Mass Index (BMI) 40.0 and over, adult: Secondary | ICD-10-CM

## 2020-12-01 DIAGNOSIS — F3289 Other specified depressive episodes: Secondary | ICD-10-CM | POA: Diagnosis not present

## 2020-12-01 DIAGNOSIS — E559 Vitamin D deficiency, unspecified: Secondary | ICD-10-CM

## 2020-12-01 DIAGNOSIS — Z9189 Other specified personal risk factors, not elsewhere classified: Secondary | ICD-10-CM | POA: Diagnosis not present

## 2020-12-01 MED ORDER — ESCITALOPRAM OXALATE 20 MG PO TABS
ORAL_TABLET | Freq: Every morning | ORAL | 0 refills | Status: DC
  Filled 2020-12-01: qty 30, 30d supply, fill #0

## 2020-12-01 MED ORDER — VITAMIN D (ERGOCALCIFEROL) 1.25 MG (50000 UNIT) PO CAPS
ORAL_CAPSULE | ORAL | 0 refills | Status: DC
  Filled 2020-12-01: qty 4, 28d supply, fill #0

## 2020-12-01 MED ORDER — SEMAGLUTIDE-WEIGHT MANAGEMENT 1.7 MG/0.75ML ~~LOC~~ SOAJ
SUBCUTANEOUS | 0 refills | Status: DC
Start: 2020-12-01 — End: 2020-12-29
  Filled 2020-12-01: qty 3, 28d supply, fill #0

## 2020-12-07 NOTE — Progress Notes (Signed)
Chief Complaint:   OBESITY Katherine Smith is here to discuss her progress with her obesity treatment plan along with follow-up of her obesity related diagnoses. Katherine Smith is on keeping a food journal and adhering to recommended goals of 1300-1500 calories and 80+ grams of protein daily and states she is following her eating plan approximately 75% of the time. Katherine Smith states she is walking and doing [employer] for 20-30 minutes 3 times per week.  Today's visit was #: 75 Starting weight: 240 lbs Starting date: 08/10/2016 Today's weight: 188 lbs Today's date: 12/01/2020 Total lbs lost to date: 52 Total lbs lost since last in-office visit: 3  Interim History: Katherine Smith continues to do well with weight loss. She has had extra challenges with increased work temptations, but she has been able to manage this well overall. She is tolerating her Wegovy with minimal side effects.  Subjective:   1. Vitamin D deficiency Katherine Smith is stable on Vit D, and she requests a refill today.  2. Other depression with emotional eating Katherine Smith's mood is stable on Lexapro. She has dealt with increased stress at work, buy she appears to be handling this stress well.  3. At risk for dehydration Katherine Smith is at risk for dehydration due to inadequate water.  Assessment/Plan:   1. Vitamin D deficiency Low Vitamin D level contributes to fatigue and are associated with obesity, breast, and colon cancer. She agrees to continue to take prescription Vitamin D @50 ,000 IU every week and will follow-up for routine testing of Vitamin D, at least 2-3 times per year to avoid over-replacement.  - Vitamin D, Ergocalciferol, (DRISDOL) 1.25 MG (50000 UNIT) CAPS capsule; TAKE 1 CAPSULE BY MOUTH EVERY 7 DAYS  Dispense: 4 capsule; Refill: 0  2. Other depression with emotional eating Behavior modification techniques were discussed today to help Delila deal with her emotional/non-hunger eating behaviors.  Orders and follow up as documented in patient  record.   - escitalopram (LEXAPRO) 20 MG tablet; TAKE 1 TABLET BY MOUTH EVERY MORNING  Dispense: 30 tablet; Refill: 0  3. At risk for dehydration Katherine Smith was given approximately 15 minutes dehydration prevention counseling today. Katherine Smith is at risk for dehydration due to weight loss and current medication(s). She was encouraged to hydrate and monitor fluid status to avoid dehydration as well as weight loss plateaus.   4. Obesity with current BMI of 33.3 Fletcher is currently in the action stage of change. As such, her goal is to continue with weight loss efforts. She has agreed to keeping a food journal and adhering to recommended goals of 1300-1500 calories and 80+ grams of protein daily.   We discussed various medication options to help Katherine Smith with her weight loss efforts and we both agreed to continue [place], and we will refill for 1 month.  - Semaglutide-Weight Management 1.7 MG/0.75ML SOAJ; INJECT 1.7MG  UNDER THE SKIN ONCE A WEEK  Dispense: 3 mL; Refill: 0  Exercise goals: As is.  Behavioral modification strategies: decreasing simple carbohydrates, increasing water intake and dealing with family or coworker sabotage.  Katherine Smith has agreed to follow-up with our clinic in 4 weeks. She was informed of the importance of frequent follow-up visits to maximize her success with intensive lifestyle modifications for her multiple health conditions.   Objective:   Blood pressure 105/72, pulse 79, temperature 98.2 F (36.8 C), height 5\' 3"  (1.6 m), weight 188 lb (85.3 kg), last menstrual period 04/14/2016, SpO2 98 %. Body mass index is 33.3 kg/m.  General: Cooperative, alert, well developed,  in no acute distress. HEENT: Conjunctivae and lids unremarkable. Cardiovascular: Regular rhythm.  Lungs: Normal work of breathing. Neurologic: No focal deficits.   Lab Results  Component Value Date   CREATININE 0.93 07/07/2020   BUN 17 07/07/2020   NA 144 07/07/2020   K 4.1 07/07/2020   CL 104 07/07/2020    CO2 25 07/07/2020   Lab Results  Component Value Date   ALT 16 07/07/2020   AST 19 07/07/2020   ALKPHOS 88 07/07/2020   BILITOT 0.5 07/07/2020   Lab Results  Component Value Date   HGBA1C 5.2 07/07/2020   HGBA1C 4.9 02/25/2020   HGBA1C 5.2 08/05/2019   HGBA1C 5.1 11/23/2016   HGBA1C 5.3 08/10/2016   Lab Results  Component Value Date   INSULIN 13.9 07/07/2020   INSULIN 19.3 02/25/2020   INSULIN 16.3 08/05/2019   INSULIN 25.8 (H) 05/08/2019   INSULIN 46.2 (H) 10/02/2018   Lab Results  Component Value Date   TSH 1.340 07/07/2020   Lab Results  Component Value Date   CHOL 254 (H) 07/07/2020   HDL 60 07/07/2020   LDLCALC 174 (H) 07/07/2020   TRIG 115 07/07/2020   Lab Results  Component Value Date   WBC 5.5 07/07/2020   HGB 13.5 07/07/2020   HCT 41.1 07/07/2020   MCV 85 07/07/2020   PLT 297 07/07/2020   No results found for: IRON, TIBC, FERRITIN   Attestation Statements:   Reviewed by clinician on day of visit: allergies, medications, problem list, medical history, surgical history, family history, social history, and previous encounter notes.   I, Trixie Dredge, am acting as transcriptionist for Dennard Nip, MD.  I have reviewed the above documentation for accuracy and completeness, and I agree with the above. -  Dennard Nip, MD

## 2020-12-08 DIAGNOSIS — M25512 Pain in left shoulder: Secondary | ICD-10-CM | POA: Diagnosis not present

## 2020-12-15 DIAGNOSIS — M25512 Pain in left shoulder: Secondary | ICD-10-CM | POA: Diagnosis not present

## 2020-12-20 DIAGNOSIS — M25512 Pain in left shoulder: Secondary | ICD-10-CM | POA: Diagnosis not present

## 2020-12-21 ENCOUNTER — Other Ambulatory Visit: Payer: Self-pay

## 2020-12-21 MED ORDER — CARESTART COVID-19 HOME TEST VI KIT
PACK | 0 refills | Status: DC
  Filled 2020-12-21: qty 4, 4d supply, fill #0

## 2020-12-21 MED ORDER — LEVOTHYROXINE SODIUM 112 MCG PO TABS
ORAL_TABLET | ORAL | 3 refills | Status: DC
  Filled 2020-12-21: qty 90, 90d supply, fill #0
  Filled 2021-03-18: qty 30, 30d supply, fill #1

## 2020-12-22 ENCOUNTER — Other Ambulatory Visit: Payer: Self-pay

## 2020-12-29 ENCOUNTER — Other Ambulatory Visit: Payer: Self-pay

## 2020-12-29 ENCOUNTER — Ambulatory Visit (INDEPENDENT_AMBULATORY_CARE_PROVIDER_SITE_OTHER): Payer: 59 | Admitting: Family Medicine

## 2020-12-29 ENCOUNTER — Encounter (INDEPENDENT_AMBULATORY_CARE_PROVIDER_SITE_OTHER): Payer: Self-pay | Admitting: Family Medicine

## 2020-12-29 VITALS — BP 112/72 | HR 85 | Temp 98.0°F | Ht 63.0 in | Wt 188.0 lb

## 2020-12-29 DIAGNOSIS — E559 Vitamin D deficiency, unspecified: Secondary | ICD-10-CM

## 2020-12-29 DIAGNOSIS — Z09 Encounter for follow-up examination after completed treatment for conditions other than malignant neoplasm: Secondary | ICD-10-CM | POA: Diagnosis not present

## 2020-12-29 DIAGNOSIS — F3289 Other specified depressive episodes: Secondary | ICD-10-CM | POA: Diagnosis not present

## 2020-12-29 DIAGNOSIS — Z6841 Body Mass Index (BMI) 40.0 and over, adult: Secondary | ICD-10-CM

## 2020-12-29 DIAGNOSIS — E66813 Obesity, class 3: Secondary | ICD-10-CM

## 2020-12-29 DIAGNOSIS — M25512 Pain in left shoulder: Secondary | ICD-10-CM | POA: Diagnosis not present

## 2020-12-29 DIAGNOSIS — Z9189 Other specified personal risk factors, not elsewhere classified: Secondary | ICD-10-CM | POA: Diagnosis not present

## 2020-12-29 MED ORDER — ESCITALOPRAM OXALATE 20 MG PO TABS
ORAL_TABLET | Freq: Every morning | ORAL | 0 refills | Status: DC
Start: 2020-12-29 — End: 2021-02-02
  Filled 2020-12-29: qty 30, 30d supply, fill #0

## 2020-12-29 MED ORDER — VITAMIN D (ERGOCALCIFEROL) 1.25 MG (50000 UNIT) PO CAPS
ORAL_CAPSULE | ORAL | 0 refills | Status: DC
  Filled 2020-12-29: qty 4, 28d supply, fill #0

## 2020-12-29 MED ORDER — SEMAGLUTIDE-WEIGHT MANAGEMENT 1.7 MG/0.75ML ~~LOC~~ SOAJ
SUBCUTANEOUS | 0 refills | Status: DC
  Filled 2020-12-29: qty 3, 28d supply, fill #0

## 2020-12-30 NOTE — Progress Notes (Signed)
Chief Complaint:   OBESITY Katherine Smith is here to discuss her progress with her obesity treatment plan along with follow-up of her obesity related diagnoses. Katherine Smith is on keeping a food journal and adhering to recommended goals of 1300-1500 calories and 80+ grams of protein daily and states she is following her eating plan approximately 60% of the time. Katherine Smith states she is walking and stretching for 30 minutes 3 times per week.  Today's visit was #: 43 Starting weight: 240 lbs Starting date: 08/10/2016 Today's weight: 188 lbs Today's date: 12/29/2020 Total lbs lost to date: 52 Total lbs lost since last in-office visit: 0  Interim History: Katherine Smith has done well with diet and weight loss. She is stable on Wegovy and her hunger is controlled. She has done well with portion control and smarter choices while on vacation. She is trying to stay active.  Subjective:   1. Vitamin D deficiency Katherine Smith doing well on Vit D, and she denies nausea, vomiting, or muscle weakness.  2. Other depression with emotional eating Katherine Smith is on Lexapro and she is doing well with portion control, and decreasing emotional eating behaviors. Her mood is stable overall.  3. At risk for impaired metabolic function Katherine Smith is at increased risk for impaired metabolic function if muscle mass decreases.  Assessment/Plan:   1. Vitamin D deficiency Low Vitamin D level contributes to fatigue and are associated with obesity, breast, and colon cancer. We will refill prescription Vitamin D for 1 month. Katherine Smith will follow-up for routine testing of Vitamin D, at least 2-3 times per year to avoid over-replacement.  - Vitamin D, Ergocalciferol, (DRISDOL) 1.25 MG (50000 UNIT) CAPS capsule; TAKE 1 CAPSULE BY MOUTH EVERY 7 DAYS  Dispense: 4 capsule; Refill: 0  2. Other depression with emotional eating Behavior modification techniques were discussed today to help Katherine Smith deal with her emotional/non-hunger eating behaviors. We will  refill Lexapro for 1 month. Orders and follow up as documented in patient record.   - escitalopram (LEXAPRO) 20 MG tablet; TAKE 1 TABLET BY MOUTH EVERY MORNING  Dispense: 30 tablet; Refill: 0  3. At risk for impaired metabolic function Katherine Smith was given approximately 15 minutes of impaired  metabolic function prevention counseling today. We discussed intensive lifestyle modifications today with an emphasis on specific nutrition and exercise instructions and strategies.   Repetitive spaced learning was employed today to elicit superior memory formation and behavioral change.  4. Obesity with current BMI of 33.4 Katherine Smith is currently in the action stage of change. As such, her goal is to continue with weight loss efforts. She has agreed to keeping a food journal and adhering to recommended goals of 1300-1500 calories and 80+ grams of protein daily.   We discussed various medication options to help Katherine Smith with her weight loss efforts and we both agreed to continue Wegovy at 1.7 mg, and we will refill for 1 month.  - Semaglutide-Weight Management 1.7 MG/0.75ML SOAJ; INJECT 1.7MG  UNDER THE SKIN ONCE A WEEK  Dispense: 3 mL; Refill: 0  Exercise goals: As is, strengthening options were discussed.  Behavioral modification strategies: increasing lean protein intake and travel eating strategies.  Katherine Smith has agreed to follow-up with our clinic in 4 weeks. She was informed of the importance of frequent follow-up visits to maximize her success with intensive lifestyle modifications for her multiple health conditions.   Objective:   Blood pressure 112/72, pulse 85, temperature 98 F (36.7 C), height 5\' 3"  (1.6 m), weight 188 lb (85.3 kg), last  menstrual period 04/14/2016, SpO2 100 %. Body mass index is 33.3 kg/m.  General: Cooperative, alert, well developed, in no acute distress. HEENT: Conjunctivae and lids unremarkable. Cardiovascular: Regular rhythm.  Lungs: Normal work of breathing. Neurologic: No  focal deficits.   Lab Results  Component Value Date   CREATININE 0.93 07/07/2020   BUN 17 07/07/2020   NA 144 07/07/2020   K 4.1 07/07/2020   CL 104 07/07/2020   CO2 25 07/07/2020   Lab Results  Component Value Date   ALT 16 07/07/2020   AST 19 07/07/2020   ALKPHOS 88 07/07/2020   BILITOT 0.5 07/07/2020   Lab Results  Component Value Date   HGBA1C 5.2 07/07/2020   HGBA1C 4.9 02/25/2020   HGBA1C 5.2 08/05/2019   HGBA1C 5.1 11/23/2016   HGBA1C 5.3 08/10/2016   Lab Results  Component Value Date   INSULIN 13.9 07/07/2020   INSULIN 19.3 02/25/2020   INSULIN 16.3 08/05/2019   INSULIN 25.8 (H) 05/08/2019   INSULIN 46.2 (H) 10/02/2018   Lab Results  Component Value Date   TSH 1.340 07/07/2020   Lab Results  Component Value Date   CHOL 254 (H) 07/07/2020   HDL 60 07/07/2020   LDLCALC 174 (H) 07/07/2020   TRIG 115 07/07/2020   Lab Results  Component Value Date   WBC 5.5 07/07/2020   HGB 13.5 07/07/2020   HCT 41.1 07/07/2020   MCV 85 07/07/2020   PLT 297 07/07/2020   No results found for: IRON, TIBC, FERRITIN  Attestation Statements:   Reviewed by clinician on day of visit: allergies, medications, problem list, medical history, surgical history, family history, social history, and previous encounter notes.   I, Trixie Dredge, am acting as transcriptionist for Dennard Nip, MD.  I have reviewed the above documentation for accuracy and completeness, and I agree with the above. -  Dennard Nip, MD

## 2020-12-31 DIAGNOSIS — M25512 Pain in left shoulder: Secondary | ICD-10-CM | POA: Diagnosis not present

## 2021-01-05 DIAGNOSIS — M25512 Pain in left shoulder: Secondary | ICD-10-CM | POA: Diagnosis not present

## 2021-01-19 DIAGNOSIS — Z85828 Personal history of other malignant neoplasm of skin: Secondary | ICD-10-CM | POA: Diagnosis not present

## 2021-01-19 DIAGNOSIS — D2272 Melanocytic nevi of left lower limb, including hip: Secondary | ICD-10-CM | POA: Diagnosis not present

## 2021-01-19 DIAGNOSIS — D225 Melanocytic nevi of trunk: Secondary | ICD-10-CM | POA: Diagnosis not present

## 2021-01-19 DIAGNOSIS — D2262 Melanocytic nevi of left upper limb, including shoulder: Secondary | ICD-10-CM | POA: Diagnosis not present

## 2021-01-19 DIAGNOSIS — B078 Other viral warts: Secondary | ICD-10-CM | POA: Diagnosis not present

## 2021-01-19 DIAGNOSIS — L900 Lichen sclerosus et atrophicus: Secondary | ICD-10-CM | POA: Diagnosis not present

## 2021-01-19 DIAGNOSIS — D2261 Melanocytic nevi of right upper limb, including shoulder: Secondary | ICD-10-CM | POA: Diagnosis not present

## 2021-01-21 DIAGNOSIS — M25512 Pain in left shoulder: Secondary | ICD-10-CM | POA: Diagnosis not present

## 2021-01-26 DIAGNOSIS — Z09 Encounter for follow-up examination after completed treatment for conditions other than malignant neoplasm: Secondary | ICD-10-CM | POA: Diagnosis not present

## 2021-02-02 ENCOUNTER — Ambulatory Visit (INDEPENDENT_AMBULATORY_CARE_PROVIDER_SITE_OTHER): Payer: 59 | Admitting: Family Medicine

## 2021-02-02 ENCOUNTER — Encounter (INDEPENDENT_AMBULATORY_CARE_PROVIDER_SITE_OTHER): Payer: Self-pay | Admitting: Family Medicine

## 2021-02-02 ENCOUNTER — Other Ambulatory Visit: Payer: Self-pay

## 2021-02-02 VITALS — BP 102/68 | HR 83 | Temp 98.0°F | Ht 63.0 in | Wt 186.0 lb

## 2021-02-02 DIAGNOSIS — Z9189 Other specified personal risk factors, not elsewhere classified: Secondary | ICD-10-CM | POA: Diagnosis not present

## 2021-02-02 DIAGNOSIS — Z6841 Body Mass Index (BMI) 40.0 and over, adult: Secondary | ICD-10-CM | POA: Diagnosis not present

## 2021-02-02 DIAGNOSIS — F3289 Other specified depressive episodes: Secondary | ICD-10-CM

## 2021-02-02 DIAGNOSIS — E559 Vitamin D deficiency, unspecified: Secondary | ICD-10-CM

## 2021-02-02 MED ORDER — SEMAGLUTIDE-WEIGHT MANAGEMENT 1.7 MG/0.75ML ~~LOC~~ SOAJ
SUBCUTANEOUS | 0 refills | Status: DC
  Filled 2021-02-02: qty 3, 28d supply, fill #0

## 2021-02-02 MED ORDER — ESCITALOPRAM OXALATE 20 MG PO TABS
ORAL_TABLET | Freq: Every morning | ORAL | 0 refills | Status: DC
  Filled 2021-02-02: qty 30, 30d supply, fill #0

## 2021-02-02 MED ORDER — VITAMIN D (ERGOCALCIFEROL) 1.25 MG (50000 UNIT) PO CAPS
ORAL_CAPSULE | ORAL | 0 refills | Status: DC
Start: 2021-02-02 — End: 2021-03-03
  Filled 2021-02-02: qty 4, 28d supply, fill #0

## 2021-02-03 ENCOUNTER — Other Ambulatory Visit: Payer: Self-pay

## 2021-02-08 ENCOUNTER — Other Ambulatory Visit: Payer: Self-pay

## 2021-02-08 MED ORDER — PANTOPRAZOLE SODIUM 40 MG PO TBEC
40.0000 mg | DELAYED_RELEASE_TABLET | Freq: Every day | ORAL | 1 refills | Status: DC
  Filled 2021-02-08: qty 90, 90d supply, fill #0
  Filled 2021-05-09: qty 90, 90d supply, fill #1

## 2021-02-08 MED FILL — Ezetimibe Tab 10 MG: ORAL | 90 days supply | Qty: 90 | Fill #0 | Status: AC

## 2021-02-21 NOTE — Progress Notes (Signed)
Chief Complaint:   OBESITY Katherine Smith is here to discuss her progress with her obesity treatment plan along with follow-up of her obesity related diagnoses. Katherine Smith is on keeping a food journal and adhering to recommended goals of 1300-1500 calories and 80+ grams of protein daily and states she is following her eating plan approximately 50% of the time. Katherine Smith states she is walking, and using stretching bands for 20 minutes 4 times per week.  Today's visit was #: 76 Starting weight: 240 lbs Starting date: 08/10/2016 Today's weight: 186 lbs Today's date: 02/02/2021 Total lbs lost to date: 54 Total lbs lost since last in-office visit: 2  Interim History: Katherine Smith continues to do well with weight loss. Her hunger is well controlled with minimal nausea on her Wegovy.  Subjective:   1. Vitamin D deficiency Katherine Smith is stable on Vit D. She denies signs of over-replacement.  2. Other depression with emotional eating Katherine Smith is stable on Lexapro, and she is working on decreasing on decreasing emotional eating behavior.  3. At risk for dehydration Katherine Smith is at risk for dehydration due to inadequate water intake.  Assessment/Plan:   1. Vitamin D deficiency Low Vitamin D level contributes to fatigue and are associated with obesity, breast, and colon cancer. We will refill prescription Vitamin D for 1 month. Katherine Smith will follow-up for routine testing of Vitamin D, at least 2-3 times per year to avoid over-replacement.  - Vitamin D, Ergocalciferol, (DRISDOL) 1.25 MG (50000 UNIT) CAPS capsule; TAKE 1 CAPSULE BY MOUTH EVERY 7 DAYS  Dispense: 4 capsule; Refill: 0  2. Other depression with emotional eating Behavior modification techniques were discussed today to help Katherine Smith deal with her emotional/non-hunger eating behaviors. We will refill Lexapro for 1 month. Orders and follow up as documented in patient record.   - escitalopram (LEXAPRO) 20 MG tablet; TAKE 1 TABLET BY MOUTH EVERY MORNING  Dispense:  30 tablet; Refill: 0  3. At risk for dehydration Katherine Smith was given approximately 15 minutes dehydration prevention counseling today. Katherine Smith is at risk for dehydration due to weight loss and current medication(s). She was encouraged to hydrate and monitor fluid status to avoid dehydration as well as weight loss plateaus.   4. Obesity with current BMI of 33.4 Katherine Smith is currently in the action stage of change. As such, her goal is to continue with weight loss efforts. She has agreed to keeping a food journal and adhering to recommended goals of 1300-1500 calories and 80+ grams of protein daily.   We discussed various medication options to help Katherine Smith with her weight loss efforts and we both agreed to continue Wegovy 1.7 mg, and we will refill for 1 month.  - Semaglutide-Weight Management 1.7 MG/0.75ML SOAJ; INJECT 1.7MG  UNDER THE SKIN ONCE A WEEK  Dispense: 3 mL; Refill: 0  Exercise goals: As is.  Behavioral modification strategies: increasing water intake and meal planning and cooking strategies.  Katherine Smith has agreed to follow-up with our clinic in 4 weeks. She was informed of the importance of frequent follow-up visits to maximize her success with intensive lifestyle modifications for her multiple health conditions.   Objective:   Blood pressure 102/68, pulse 83, temperature 98 F (36.7 C), height 5\' 3"  (1.6 m), weight 186 lb (84.4 kg), last menstrual period 04/14/2016, SpO2 97 %. Body mass index is 32.95 kg/m.  General: Cooperative, alert, well developed, in no acute distress. HEENT: Conjunctivae and lids unremarkable. Cardiovascular: Regular rhythm.  Lungs: Normal work of breathing. Neurologic: No focal deficits.  Lab Results  Component Value Date   CREATININE 0.93 07/07/2020   BUN 17 07/07/2020   NA 144 07/07/2020   K 4.1 07/07/2020   CL 104 07/07/2020   CO2 25 07/07/2020   Lab Results  Component Value Date   ALT 16 07/07/2020   AST 19 07/07/2020   ALKPHOS 88 07/07/2020    BILITOT 0.5 07/07/2020   Lab Results  Component Value Date   HGBA1C 5.2 07/07/2020   HGBA1C 4.9 02/25/2020   HGBA1C 5.2 08/05/2019   HGBA1C 5.1 11/23/2016   HGBA1C 5.3 08/10/2016   Lab Results  Component Value Date   INSULIN 13.9 07/07/2020   INSULIN 19.3 02/25/2020   INSULIN 16.3 08/05/2019   INSULIN 25.8 (H) 05/08/2019   INSULIN 46.2 (H) 10/02/2018   Lab Results  Component Value Date   TSH 1.340 07/07/2020   Lab Results  Component Value Date   CHOL 254 (H) 07/07/2020   HDL 60 07/07/2020   LDLCALC 174 (H) 07/07/2020   TRIG 115 07/07/2020   Lab Results  Component Value Date   VD25OH 44.0 07/07/2020   VD25OH 40.1 02/25/2020   VD25OH 37.6 08/05/2019   Lab Results  Component Value Date   WBC 5.5 07/07/2020   HGB 13.5 07/07/2020   HCT 41.1 07/07/2020   MCV 85 07/07/2020   PLT 297 07/07/2020   No results found for: IRON, TIBC, FERRITIN  Attestation Statements:   Reviewed by clinician on day of visit: allergies, medications, problem list, medical history, surgical history, family history, social history, and previous encounter notes.   I, Trixie Dredge, am acting as transcriptionist for Dennard Nip, MD.  I have reviewed the above documentation for accuracy and completeness, and I agree with the above. -  Dennard Nip, MD

## 2021-02-22 DIAGNOSIS — Z760 Encounter for issue of repeat prescription: Secondary | ICD-10-CM | POA: Diagnosis not present

## 2021-02-23 DIAGNOSIS — E782 Mixed hyperlipidemia: Secondary | ICD-10-CM | POA: Diagnosis not present

## 2021-02-23 DIAGNOSIS — Z131 Encounter for screening for diabetes mellitus: Secondary | ICD-10-CM | POA: Diagnosis not present

## 2021-02-28 ENCOUNTER — Other Ambulatory Visit: Payer: Self-pay

## 2021-02-28 MED FILL — Clobetasol Propionate Oint 0.05%: CUTANEOUS | 20 days supply | Qty: 30 | Fill #0 | Status: AC

## 2021-03-02 ENCOUNTER — Ambulatory Visit (INDEPENDENT_AMBULATORY_CARE_PROVIDER_SITE_OTHER): Payer: 59 | Admitting: Family Medicine

## 2021-03-02 ENCOUNTER — Other Ambulatory Visit: Payer: Self-pay

## 2021-03-02 DIAGNOSIS — E039 Hypothyroidism, unspecified: Secondary | ICD-10-CM | POA: Diagnosis not present

## 2021-03-02 DIAGNOSIS — E782 Mixed hyperlipidemia: Secondary | ICD-10-CM | POA: Diagnosis not present

## 2021-03-02 DIAGNOSIS — Z0000 Encounter for general adult medical examination without abnormal findings: Secondary | ICD-10-CM | POA: Diagnosis not present

## 2021-03-03 ENCOUNTER — Other Ambulatory Visit: Payer: Self-pay

## 2021-03-03 ENCOUNTER — Other Ambulatory Visit (INDEPENDENT_AMBULATORY_CARE_PROVIDER_SITE_OTHER): Payer: Self-pay | Admitting: Family Medicine

## 2021-03-03 DIAGNOSIS — E559 Vitamin D deficiency, unspecified: Secondary | ICD-10-CM

## 2021-03-03 DIAGNOSIS — Z6841 Body Mass Index (BMI) 40.0 and over, adult: Secondary | ICD-10-CM

## 2021-03-03 DIAGNOSIS — F3289 Other specified depressive episodes: Secondary | ICD-10-CM

## 2021-03-03 NOTE — Telephone Encounter (Signed)
Last OV with Dr. Beasley 

## 2021-03-03 NOTE — Telephone Encounter (Signed)
Will refill at 03/09/21 office visit

## 2021-03-09 ENCOUNTER — Encounter (INDEPENDENT_AMBULATORY_CARE_PROVIDER_SITE_OTHER): Payer: Self-pay | Admitting: Family Medicine

## 2021-03-09 ENCOUNTER — Other Ambulatory Visit: Payer: Self-pay

## 2021-03-09 ENCOUNTER — Ambulatory Visit (INDEPENDENT_AMBULATORY_CARE_PROVIDER_SITE_OTHER): Payer: 59 | Admitting: Family Medicine

## 2021-03-09 VITALS — BP 113/69 | HR 71 | Temp 97.6°F | Ht 63.0 in | Wt 186.0 lb

## 2021-03-09 DIAGNOSIS — Z6841 Body Mass Index (BMI) 40.0 and over, adult: Secondary | ICD-10-CM

## 2021-03-09 DIAGNOSIS — Z9189 Other specified personal risk factors, not elsewhere classified: Secondary | ICD-10-CM | POA: Diagnosis not present

## 2021-03-09 DIAGNOSIS — E559 Vitamin D deficiency, unspecified: Secondary | ICD-10-CM

## 2021-03-09 DIAGNOSIS — F3289 Other specified depressive episodes: Secondary | ICD-10-CM

## 2021-03-09 MED ORDER — VITAMIN D (ERGOCALCIFEROL) 1.25 MG (50000 UNIT) PO CAPS
ORAL_CAPSULE | ORAL | 0 refills | Status: DC
Start: 2021-03-09 — End: 2021-04-06
  Filled 2021-03-09: qty 4, 28d supply, fill #0

## 2021-03-09 MED ORDER — SEMAGLUTIDE-WEIGHT MANAGEMENT 1.7 MG/0.75ML ~~LOC~~ SOAJ
SUBCUTANEOUS | 0 refills | Status: DC
Start: 2021-03-09 — End: 2021-04-06
  Filled 2021-03-09: qty 3, 28d supply, fill #0

## 2021-03-09 MED ORDER — ESCITALOPRAM OXALATE 20 MG PO TABS
ORAL_TABLET | Freq: Every morning | ORAL | 0 refills | Status: DC
  Filled 2021-03-09: qty 30, 30d supply, fill #0

## 2021-03-14 ENCOUNTER — Other Ambulatory Visit: Payer: Self-pay

## 2021-03-14 MED ORDER — CARESTART COVID-19 HOME TEST VI KIT
PACK | 0 refills | Status: DC
  Filled 2021-03-14: qty 2, 4d supply, fill #0

## 2021-03-15 NOTE — Progress Notes (Signed)
Chief Complaint:   OBESITY Katherine Smith is here to discuss her progress with her obesity treatment plan along with follow-up of her obesity related diagnoses. Katherine Smith is on keeping a food journal and adhering to recommended goals of 1300-1500 calories and 80+ grams of protein daily and states she is following her eating plan approximately 60% of the time. Katherine Smith states she is walking for 30 minutes 3 times per week.  Today's visit was #: 40 Starting weight: 240 lbs Starting date: 08/10/2016 Today's weight: 186 lbs Today's date: 03/09/2021 Total lbs lost to date: 54 Total lbs lost since last in-office visit: 0  Interim History: Katherine Smith is working on diet and exercise, and she has done well with maintaining her weight loss. She ran out of [facility] for a week and she took her [place]. She had left over, and which should have been following.   Subjective:   1. Vitamin D deficiency Katherine Smith is stable on Vit D, and she denies nausea, vomiting, or muscle weakness.  2. Other depression with emotional eating Katherine Smith is stable on Lexapro, and her mood is good and she is minimizing emotional eating behaviors.  3. At risk for dehydration Katherine Smith is at risk for dehydration due to inadequate water intake.  Assessment/Plan:   1. Vitamin D deficiency Low Vitamin D level contributes to fatigue and are associated with obesity, breast, and colon cancer. We will refill prescription Vitamin D for 1 month. Katherine Smith will follow-up for routine testing of Vitamin D, at least 2-3 times per year to avoid over-replacement.  - Vitamin D, Ergocalciferol, (DRISDOL) 1.25 MG (50000 UNIT) CAPS capsule; TAKE 1 CAPSULE BY MOUTH EVERY 7 DAYS  Dispense: 4 capsule; Refill: 0  2. Other depression with emotional eating Behavior modification techniques were discussed today to help Katherine Smith deal with her emotional/non-hunger eating behaviors. We will refill Lexapro for 1 month. Orders and follow up as documented in patient record.    - escitalopram (LEXAPRO) 20 MG tablet; TAKE 1 TABLET BY MOUTH EVERY MORNING  Dispense: 30 tablet; Refill: 0  3. At risk for dehydration Katherine Smith was given approximately 15 minutes dehydration prevention counseling today. Katherine Smith is at risk for dehydration due to weight loss and current medication(s). She was encouraged to hydrate and monitor fluid status to avoid dehydration as well as weight loss plateaus.   4. Obesity with current BMI of 33.1 Katherine Smith is currently in the action stage of change. As such, her goal is to continue with weight loss efforts. She has agreed to keeping a food journal and adhering to recommended goals of 1300-1500 calories and 80+ grams of protein daily.   We discussed various medication options to help Katherine Smith with her weight loss efforts and we both agreed to continue Wegovy 1.7 mg and we will rfill for 1 month.  - Semaglutide-Weight Management 1.7 MG/0.75ML SOAJ; INJECT 1.'7MG'$  UNDER THE SKIN ONCE A WEEK  Dispense: 3 mL; Refill: 0  Exercise goals: As is.  Behavioral modification strategies: increasing lean protein intake and meal planning and cooking strategies.  Katherine Smith has agreed to follow-up with our clinic in 4 weeks. She was informed of the importance of frequent follow-up visits to maximize her success with intensive lifestyle modifications for her multiple health conditions.   Objective:   Blood pressure 113/69, pulse 71, temperature 97.6 F (36.4 C), height '5\' 3"'$  (1.6 m), weight 186 lb (84.4 kg), last menstrual period 04/14/2016, SpO2 98 %. Body mass index is 32.95 kg/m.  General: Cooperative, alert, well developed,  in no acute distress. HEENT: Conjunctivae and lids unremarkable. Cardiovascular: Regular rhythm.  Lungs: Normal work of breathing. Neurologic: No focal deficits.   Lab Results  Component Value Date   CREATININE 0.93 07/07/2020   BUN 17 07/07/2020   NA 144 07/07/2020   K 4.1 07/07/2020   CL 104 07/07/2020   CO2 25 07/07/2020   Lab  Results  Component Value Date   ALT 16 07/07/2020   AST 19 07/07/2020   ALKPHOS 88 07/07/2020   BILITOT 0.5 07/07/2020   Lab Results  Component Value Date   HGBA1C 5.2 07/07/2020   HGBA1C 4.9 02/25/2020   HGBA1C 5.2 08/05/2019   HGBA1C 5.1 11/23/2016   HGBA1C 5.3 08/10/2016   Lab Results  Component Value Date   INSULIN 13.9 07/07/2020   INSULIN 19.3 02/25/2020   INSULIN 16.3 08/05/2019   INSULIN 25.8 (H) 05/08/2019   INSULIN 46.2 (H) 10/02/2018   Lab Results  Component Value Date   TSH 1.340 07/07/2020   Lab Results  Component Value Date   CHOL 254 (H) 07/07/2020   HDL 60 07/07/2020   LDLCALC 174 (H) 07/07/2020   TRIG 115 07/07/2020   Lab Results  Component Value Date   VD25OH 44.0 07/07/2020   VD25OH 40.1 02/25/2020   VD25OH 37.6 08/05/2019   Lab Results  Component Value Date   WBC 5.5 07/07/2020   HGB 13.5 07/07/2020   HCT 41.1 07/07/2020   MCV 85 07/07/2020   PLT 297 07/07/2020   No results found for: IRON, TIBC, FERRITIN  Attestation Statements:   Reviewed by clinician on day of visit: allergies, medications, problem list, medical history, surgical history, family history, social history, and previous encounter notes.   I, Katherine Smith, am acting as transcriptionist for Dennard Nip, MD.  I have reviewed the above documentation for accuracy and completeness, and I agree with the above. -  Dennard Nip, MD

## 2021-03-18 ENCOUNTER — Other Ambulatory Visit: Payer: Self-pay

## 2021-03-22 ENCOUNTER — Other Ambulatory Visit: Payer: Self-pay

## 2021-03-29 ENCOUNTER — Other Ambulatory Visit: Payer: Self-pay

## 2021-03-29 MED ORDER — CARESTART COVID-19 HOME TEST VI KIT
PACK | 0 refills | Status: DC
  Filled 2021-03-29: qty 2, 4d supply, fill #0

## 2021-04-06 ENCOUNTER — Ambulatory Visit (INDEPENDENT_AMBULATORY_CARE_PROVIDER_SITE_OTHER): Payer: 59 | Admitting: Family Medicine

## 2021-04-06 ENCOUNTER — Encounter (INDEPENDENT_AMBULATORY_CARE_PROVIDER_SITE_OTHER): Payer: Self-pay | Admitting: Family Medicine

## 2021-04-06 ENCOUNTER — Other Ambulatory Visit: Payer: Self-pay

## 2021-04-06 VITALS — BP 104/71 | HR 80 | Temp 98.2°F | Ht 63.0 in | Wt 188.0 lb

## 2021-04-06 DIAGNOSIS — E559 Vitamin D deficiency, unspecified: Secondary | ICD-10-CM

## 2021-04-06 DIAGNOSIS — Z9189 Other specified personal risk factors, not elsewhere classified: Secondary | ICD-10-CM | POA: Diagnosis not present

## 2021-04-06 DIAGNOSIS — F3289 Other specified depressive episodes: Secondary | ICD-10-CM | POA: Diagnosis not present

## 2021-04-06 DIAGNOSIS — Z6841 Body Mass Index (BMI) 40.0 and over, adult: Secondary | ICD-10-CM

## 2021-04-06 MED ORDER — VITAMIN D (ERGOCALCIFEROL) 1.25 MG (50000 UNIT) PO CAPS
ORAL_CAPSULE | ORAL | 0 refills | Status: DC
Start: 2021-04-06 — End: 2021-05-03
  Filled 2021-04-06: qty 4, 28d supply, fill #0

## 2021-04-06 MED ORDER — SEMAGLUTIDE-WEIGHT MANAGEMENT 1.7 MG/0.75ML ~~LOC~~ SOAJ
SUBCUTANEOUS | 0 refills | Status: DC
  Filled 2021-04-06 – 2021-04-08 (×2): qty 3, 28d supply, fill #0

## 2021-04-06 MED ORDER — ESCITALOPRAM OXALATE 20 MG PO TABS
ORAL_TABLET | Freq: Every morning | ORAL | 0 refills | Status: DC
  Filled 2021-04-06: qty 30, 30d supply, fill #0

## 2021-04-07 ENCOUNTER — Encounter (INDEPENDENT_AMBULATORY_CARE_PROVIDER_SITE_OTHER): Payer: Self-pay

## 2021-04-07 NOTE — Progress Notes (Signed)
Chief Complaint:   OBESITY Katherine Smith is here to discuss her progress with her obesity treatment plan along with follow-up of her obesity related diagnoses. Katherine Smith is on keeping a food journal and adhering to recommended goals of 1300-1500 calories and 80+ grams of protein daily and states she is following her eating plan approximately 60% of the time. Katherine Smith states she is doing 0 minutes 0 times per week.  Today's visit was #: 61 Starting weight: 240 lbs Starting date: 08/10/2016 Today's weight: 188 lbs Today's date: 04/06/2021 Total lbs lost to date: 52 Total lbs lost since last in-office visit: 0  Interim History: Katherine Smith has had a lot of challenges or stressors in the last month, which made staying on track more difficult. It looks like her life is settling down and she will be able to get back on track with her weight loss.  Subjective:   1. Vitamin D deficiency Katherine Smith is stable on Vit D, and she denies signs of over-replacement.  2. Other depression with emotional eating Katherine Smith has had increased stress with family and she has done more emotional eating behavior. She is mindful of this and she is working on making strategies to decrease emotional eating behaviors.  3. At risk for impaired metabolic function Katherine Smith is at increased risk for impaired metabolic function if protein decreases.  Assessment/Plan:   1. Vitamin D deficiency Low Vitamin D level contributes to fatigue and are associated with obesity, breast, and colon cancer. We will refill prescription Vitamin D for 1 month. Katherine Smith will follow-up for routine testing of Vitamin D, at least 2-3 times per year to avoid over-replacement.  - Vitamin D, Ergocalciferol, (DRISDOL) 1.25 MG (50000 UNIT) CAPS capsule; TAKE 1 CAPSULE BY MOUTH EVERY 7 DAYS  Dispense: 4 capsule; Refill: 0  2. Other depression with emotional eating Behavior modification techniques were discussed today to help Katherine Smith deal with her emotional/non-hunger  eating behaviors. We will refill Lexapro for 1 month. Orders and follow up as documented in patient record.   - escitalopram (LEXAPRO) 20 MG tablet; TAKE 1 TABLET BY MOUTH EVERY MORNING  Dispense: 30 tablet; Refill: 0  3. At risk for impaired metabolic function Katherine Smith was given approximately 15 minutes of impaired  metabolic function prevention counseling today. We discussed intensive lifestyle modifications today with an emphasis on specific nutrition and exercise instructions and strategies.   Repetitive spaced learning was employed today to elicit superior memory formation and behavioral change.  4. Obesity with current BMI of 33.4 Katherine Smith is currently in the action stage of change. As such, her goal is to continue with weight loss efforts. She has agreed to change to the Category 2 Plan and keeping a food journal and adhering to recommended goals of 400-550 calories and 35+ grams of protein at supper daily.   We discussed various medication options to help Katherine Smith with her weight loss efforts and we both agreed to continue [place], and we will refill for 1 month.  - Semaglutide-Weight Management 1.7 MG/0.75ML SOAJ; INJECT 1.'7MG'$  UNDER THE SKIN ONCE A WEEK  Dispense: 3 mL; Refill: 0  Behavioral modification strategies: increasing lean protein intake.  Katherine Smith has agreed to follow-up with our clinic in 4 weeks. She was informed of the importance of frequent follow-up visits to maximize her success with intensive lifestyle modifications for her multiple health conditions.   Objective:   Blood pressure 104/71, pulse 80, temperature 98.2 F (36.8 C), height '5\' 3"'$  (1.6 m), weight 188 lb (85.3 kg),  last menstrual period 04/14/2016, SpO2 99 %. Body mass index is 33.3 kg/m.  General: Cooperative, alert, well developed, in no acute distress. HEENT: Conjunctivae and lids unremarkable. Cardiovascular: Regular rhythm.  Lungs: Normal work of breathing. Neurologic: No focal deficits.   Lab Results   Component Value Date   CREATININE 0.93 07/07/2020   BUN 17 07/07/2020   NA 144 07/07/2020   K 4.1 07/07/2020   CL 104 07/07/2020   CO2 25 07/07/2020   Lab Results  Component Value Date   ALT 16 07/07/2020   AST 19 07/07/2020   ALKPHOS 88 07/07/2020   BILITOT 0.5 07/07/2020   Lab Results  Component Value Date   HGBA1C 5.2 07/07/2020   HGBA1C 4.9 02/25/2020   HGBA1C 5.2 08/05/2019   HGBA1C 5.1 11/23/2016   HGBA1C 5.3 08/10/2016   Lab Results  Component Value Date   INSULIN 13.9 07/07/2020   INSULIN 19.3 02/25/2020   INSULIN 16.3 08/05/2019   INSULIN 25.8 (H) 05/08/2019   INSULIN 46.2 (H) 10/02/2018   Lab Results  Component Value Date   TSH 1.340 07/07/2020   Lab Results  Component Value Date   CHOL 254 (H) 07/07/2020   HDL 60 07/07/2020   LDLCALC 174 (H) 07/07/2020   TRIG 115 07/07/2020   Lab Results  Component Value Date   VD25OH 44.0 07/07/2020   VD25OH 40.1 02/25/2020   VD25OH 37.6 08/05/2019   Lab Results  Component Value Date   WBC 5.5 07/07/2020   HGB 13.5 07/07/2020   HCT 41.1 07/07/2020   MCV 85 07/07/2020   PLT 297 07/07/2020   No results found for: IRON, TIBC, FERRITIN  Attestation Statements:   Reviewed by clinician on day of visit: allergies, medications, problem list, medical history, surgical history, family history, social history, and previous encounter notes.   I, Trixie Dredge, am acting as transcriptionist for Dennard Nip, MD.  I have reviewed the above documentation for accuracy and completeness, and I agree with the above. -  Dennard Nip, MD

## 2021-04-08 ENCOUNTER — Other Ambulatory Visit: Payer: Self-pay

## 2021-04-25 ENCOUNTER — Other Ambulatory Visit: Payer: Self-pay

## 2021-04-26 ENCOUNTER — Other Ambulatory Visit: Payer: Self-pay

## 2021-04-27 ENCOUNTER — Other Ambulatory Visit: Payer: Self-pay

## 2021-04-27 MED ORDER — LEVOTHYROXINE SODIUM 112 MCG PO TABS
ORAL_TABLET | ORAL | 3 refills | Status: DC
  Filled 2021-04-27: qty 90, 90d supply, fill #0
  Filled 2021-07-22: qty 90, 90d supply, fill #1

## 2021-05-03 ENCOUNTER — Ambulatory Visit: Payer: 59 | Attending: Internal Medicine

## 2021-05-03 ENCOUNTER — Other Ambulatory Visit: Payer: Self-pay

## 2021-05-03 DIAGNOSIS — Z23 Encounter for immunization: Secondary | ICD-10-CM

## 2021-05-03 MED ORDER — CARESTART COVID-19 HOME TEST VI KIT
PACK | 0 refills | Status: DC
  Filled 2021-05-03: qty 2, 4d supply, fill #0

## 2021-05-03 MED ORDER — FLUARIX QUADRIVALENT 0.5 ML IM SUSY
PREFILLED_SYRINGE | INTRAMUSCULAR | 0 refills | Status: DC
  Filled 2021-05-03: qty 0.5, 1d supply, fill #0

## 2021-05-03 MED ORDER — PFIZER COVID-19 VAC BIVALENT 30 MCG/0.3ML IM SUSP
INTRAMUSCULAR | 0 refills | Status: DC
  Filled 2021-05-03: qty 0.3, 1d supply, fill #0

## 2021-05-03 NOTE — Progress Notes (Signed)
   Covid-19 Vaccination Clinic  Name:  HANH KERTESZ    MRN: [id number] DOB: 02-18-1961  05/03/2021  Ms. Detzel was observed post Covid-19 immunization for 15 minutes without incident. She was provided with Vaccine Information Sheet and instruction to access the V-Safe system.   Ms. Doepke was instructed to call 911 with any severe reactions post vaccine: Difficulty breathing  Swelling of face and throat  A fast heartbeat  A bad rash all over body  Dizziness and weakness   Lu Duffel, PharmD, MBA Clinical Acute Care Pharmacist

## 2021-05-04 ENCOUNTER — Other Ambulatory Visit: Payer: Self-pay

## 2021-05-04 ENCOUNTER — Ambulatory Visit (INDEPENDENT_AMBULATORY_CARE_PROVIDER_SITE_OTHER): Payer: 59 | Admitting: Family Medicine

## 2021-05-04 ENCOUNTER — Encounter (INDEPENDENT_AMBULATORY_CARE_PROVIDER_SITE_OTHER): Payer: Self-pay | Admitting: Family Medicine

## 2021-05-04 VITALS — BP 107/70 | HR 77 | Temp 97.5°F | Ht 63.0 in | Wt 183.0 lb

## 2021-05-04 DIAGNOSIS — E88819 Insulin resistance, unspecified: Secondary | ICD-10-CM

## 2021-05-04 DIAGNOSIS — E8881 Metabolic syndrome: Secondary | ICD-10-CM | POA: Diagnosis not present

## 2021-05-04 DIAGNOSIS — Z9189 Other specified personal risk factors, not elsewhere classified: Secondary | ICD-10-CM | POA: Diagnosis not present

## 2021-05-04 DIAGNOSIS — F3289 Other specified depressive episodes: Secondary | ICD-10-CM | POA: Diagnosis not present

## 2021-05-04 DIAGNOSIS — E66813 Obesity, class 3: Secondary | ICD-10-CM

## 2021-05-04 DIAGNOSIS — E559 Vitamin D deficiency, unspecified: Secondary | ICD-10-CM

## 2021-05-04 DIAGNOSIS — Z6841 Body Mass Index (BMI) 40.0 and over, adult: Secondary | ICD-10-CM | POA: Diagnosis not present

## 2021-05-04 MED ORDER — ESCITALOPRAM OXALATE 20 MG PO TABS
ORAL_TABLET | Freq: Every morning | ORAL | 0 refills | Status: DC
Start: 2021-05-04 — End: 2021-06-08
  Filled 2021-05-04: qty 30, 30d supply, fill #0

## 2021-05-04 MED ORDER — VITAMIN D (ERGOCALCIFEROL) 1.25 MG (50000 UNIT) PO CAPS
ORAL_CAPSULE | ORAL | 0 refills | Status: DC
Start: 2021-05-04 — End: 2021-06-08
  Filled 2021-05-04: qty 4, 28d supply, fill #0

## 2021-05-04 MED ORDER — SEMAGLUTIDE-WEIGHT MANAGEMENT 1.7 MG/0.75ML ~~LOC~~ SOAJ
SUBCUTANEOUS | 0 refills | Status: DC
  Filled 2021-05-04: qty 3, 28d supply, fill #0

## 2021-05-04 NOTE — Progress Notes (Signed)
Chief Complaint:   OBESITY Katherine Smith is here to discuss her progress with her obesity treatment plan along with follow-up of her obesity related diagnoses. Katherine Smith is on the Category 2 Plan and keeping a food journal and adhering to recommended goals of 400-550 calories and 35+ grams of protein at supper daily and states she is following her eating plan approximately 90% of the time. Katherine Smith states she is walking for 30 minutes 4 times per week.  Today's visit was #: 64 Starting weight: 240 lbs Starting date: 08/10/2016 Today's weight: 183 lbs Today's date: 05/04/2021 Total lbs lost to date: 63 Total lbs lost since last in-office visit: 5  Interim History: Katherine Smith continues to do very well with weight loss on her plan. She is following the Category 2 plan now and losing weight better, but her hunger is still controlled.  Subjective:   1. Insulin resistance Katherine Smith continues to do well on her GLP-1, and she notes decreased polyphagia. She denies signs of hypoglycemia. She is active and she is following her plan well.  2. Vitamin D deficiency Katherine Smith is stable on Vit D, and she denies nausea, vomiting, or muscle weakness.  3. Other depression with emotional eating Katherine Smith's mood is stable on Lexapro, and she is currently planning retirement but she is still happy with her job. She has decreased emotional eating behaviors.  4. At risk for impaired metabolic function Katherine Smith is at increased risk for impaired metabolic function if calories or protein drops too low.  Assessment/Plan:   1. Insulin resistance Katherine Smith will continue with her eating plan, weight loss, and decreasing simple carbohydrates to help decrease the risk of diabetes. Katherine Smith agreed to follow-up with [place] as directed to closely monitor her progress.  2. Vitamin D deficiency Low Vitamin D level contributes to fatigue and are associated with obesity, breast, and colon cancer. We will refill prescription Vitamin D for 1 month.  Katherine Smith will follow-up for routine testing of Vitamin D, at least 2-3 times per year to avoid over-replacement.  - Vitamin D, Ergocalciferol, (DRISDOL) 1.25 MG (50000 UNIT) CAPS capsule; TAKE 1 CAPSULE BY MOUTH EVERY 7 DAYS  Dispense: 4 capsule; Refill: 0  3. Other depression with emotional eating Behavior modification techniques were discussed today to help Katherine Smith deal with her emotional/non-hunger eating behaviors. We will refill Lexapro for 1 month. Orders and follow up as documented in patient record.   - escitalopram (LEXAPRO) 20 MG tablet; TAKE 1 TABLET BY MOUTH EVERY MORNING  Dispense: 30 tablet; Refill: 0  4. At risk for impaired metabolic function Katherine Smith was given approximately 15 minutes of impaired  metabolic function prevention counseling today. We discussed intensive lifestyle modifications today with an emphasis on specific nutrition and exercise instructions and strategies.   Repetitive spaced learning was employed today to elicit superior memory formation and behavioral change.  5. Obesity with current BMI of 32.5 Katherine Smith is currently in the action stage of change. As such, her goal is to continue with weight loss efforts. She has agreed to the Category 2 Plan.   We discussed various medication options to help Katherine Smith with her weight loss efforts and we both agreed to continue Wegovy 1.7 mg and we will refill for 1 month.  - Semaglutide-Weight Management 1.7 MG/0.75ML SOAJ; INJECT 1.7MG  UNDER THE SKIN ONCE A WEEK  Dispense: 3 mL; Refill: 0  Exercise goals: As is.  Behavioral modification strategies: meal planning and cooking strategies.  Katherine Smith has agreed to follow-up with our clinic in  4 weeks. She was informed of the importance of frequent follow-up visits to maximize her success with intensive lifestyle modifications for her multiple health conditions.   Objective:   Blood pressure 107/70, pulse 77, temperature (!) 97.5 F (36.4 C), height 5\' 3"  (1.6 m), weight 183 lb  (83 kg), last menstrual period 04/14/2016, SpO2 98 %. Body mass index is 32.42 kg/m.  General: Cooperative, alert, well developed, in no acute distress. HEENT: Conjunctivae and lids unremarkable. Cardiovascular: Regular rhythm.  Lungs: Normal work of breathing. Neurologic: No focal deficits.   Lab Results  Component Value Date   CREATININE 0.93 07/07/2020   BUN 17 07/07/2020   NA 144 07/07/2020   K 4.1 07/07/2020   CL 104 07/07/2020   CO2 25 07/07/2020   Lab Results  Component Value Date   ALT 16 07/07/2020   AST 19 07/07/2020   ALKPHOS 88 07/07/2020   BILITOT 0.5 07/07/2020   Lab Results  Component Value Date   HGBA1C 5.2 07/07/2020   HGBA1C 4.9 02/25/2020   HGBA1C 5.2 08/05/2019   HGBA1C 5.1 11/23/2016   HGBA1C 5.3 08/10/2016   Lab Results  Component Value Date   INSULIN 13.9 07/07/2020   INSULIN 19.3 02/25/2020   INSULIN 16.3 08/05/2019   INSULIN 25.8 (H) 05/08/2019   INSULIN 46.2 (H) 10/02/2018   Lab Results  Component Value Date   TSH 1.340 07/07/2020   Lab Results  Component Value Date   CHOL 254 (H) 07/07/2020   HDL 60 07/07/2020   LDLCALC 174 (H) 07/07/2020   TRIG 115 07/07/2020   Lab Results  Component Value Date   VD25OH 44.0 07/07/2020   VD25OH 40.1 02/25/2020   VD25OH 37.6 08/05/2019   Lab Results  Component Value Date   WBC 5.5 07/07/2020   HGB 13.5 07/07/2020   HCT 41.1 07/07/2020   MCV 85 07/07/2020   PLT 297 07/07/2020   No results found for: IRON, TIBC, FERRITIN  Attestation Statements:   Reviewed by clinician on day of visit: allergies, medications, problem list, medical history, surgical history, family history, social history, and previous encounter notes.   I, Trixie Dredge, am acting as transcriptionist for Dennard Nip, MD.  I have reviewed the above documentation for accuracy and completeness, and I agree with the above. -  Dennard Nip, MD

## 2021-05-09 ENCOUNTER — Other Ambulatory Visit: Payer: Self-pay

## 2021-05-10 ENCOUNTER — Other Ambulatory Visit: Payer: Self-pay

## 2021-05-10 MED ORDER — EZETIMIBE 10 MG PO TABS
10.0000 mg | ORAL_TABLET | Freq: Every day | ORAL | 1 refills | Status: DC
  Filled 2021-05-10: qty 90, 90d supply, fill #0
  Filled 2021-08-05: qty 90, 90d supply, fill #1

## 2021-06-08 ENCOUNTER — Encounter (INDEPENDENT_AMBULATORY_CARE_PROVIDER_SITE_OTHER): Payer: Self-pay | Admitting: Family Medicine

## 2021-06-08 ENCOUNTER — Ambulatory Visit (INDEPENDENT_AMBULATORY_CARE_PROVIDER_SITE_OTHER): Payer: 59 | Admitting: Family Medicine

## 2021-06-08 ENCOUNTER — Other Ambulatory Visit: Payer: Self-pay

## 2021-06-08 VITALS — BP 100/66 | HR 68 | Temp 97.8°F | Ht 63.0 in | Wt 184.0 lb

## 2021-06-08 DIAGNOSIS — Z9189 Other specified personal risk factors, not elsewhere classified: Secondary | ICD-10-CM

## 2021-06-08 DIAGNOSIS — Z6841 Body Mass Index (BMI) 40.0 and over, adult: Secondary | ICD-10-CM | POA: Diagnosis not present

## 2021-06-08 DIAGNOSIS — E559 Vitamin D deficiency, unspecified: Secondary | ICD-10-CM | POA: Diagnosis not present

## 2021-06-08 DIAGNOSIS — F3289 Other specified depressive episodes: Secondary | ICD-10-CM | POA: Diagnosis not present

## 2021-06-08 MED ORDER — VITAMIN D (ERGOCALCIFEROL) 1.25 MG (50000 UNIT) PO CAPS
ORAL_CAPSULE | ORAL | 0 refills | Status: DC
Start: 2021-06-08 — End: 2021-07-13
  Filled 2021-06-08: qty 4, 28d supply, fill #0

## 2021-06-08 MED ORDER — ESCITALOPRAM OXALATE 20 MG PO TABS
ORAL_TABLET | Freq: Every morning | ORAL | 0 refills | Status: DC
  Filled 2021-06-08: qty 30, 30d supply, fill #0

## 2021-06-08 NOTE — Progress Notes (Signed)
Chief Complaint:   OBESITY Katherine Smith is here to discuss her progress with her obesity treatment plan along with follow-up of her obesity related diagnoses. Katherine Smith is on the Category 2 Plan and states she is following her eating plan approximately 75% of the time. Katherine Smith states she is walking for 30 minutes 4 times per week.  Today's visit was #: 77 Starting weight: 240 lbs Starting date: 08/10/2016 Today's weight: 184 lbs Today's date: 06/08/2021 Total lbs lost to date: 56 Total lbs lost since last in-office visit: 0  Interim History: Katherine Smith is retaining some fluid today. She has eaten out more but she is mindful of her choices. She notes some increased hunger at times and she wonders if increasing her dose would help.  Subjective:   1. Vitamin D deficiency Katherine Smith is stable on Vit D, and she denies nausea, vomiting, or muscle weakness.  2. Other depression with emotional eating Katherine Smith is on Lexapro, and she notes her mood is stable. She has decreased irritability and anxiety, and decreased emotional eating behaviors as well.  3. At risk for impaired metabolic function Katherine Smith is at increased risk for impaired metabolic function if calories or protein decreases.  Assessment/Plan:   1. Vitamin D deficiency Low Vitamin D level contributes to fatigue and are associated with obesity, breast, and colon cancer. We will refill prescription Vitamin D 50,000 IU every week #4 for 1 month. Katherine Smith will follow-up for routine testing of Vitamin D, at least 2-3 times per year to avoid over-replacement.  2. Other depression with emotional eating Behavior modification techniques were discussed today to help Katherine Smith deal with her emotional/non-hunger eating behaviors. We will refill Lexapro 20 mg q AM #30 for 1 month. Orders and follow up as documented in patient record.   3. At risk for impaired metabolic function Katherine Smith was given approximately 15 minutes of impaired  metabolic function  prevention counseling today. We discussed intensive lifestyle modifications today with an emphasis on specific nutrition and exercise instructions and strategies.   Repetitive spaced learning was employed today to elicit superior memory formation and behavioral change.  4. Obesity with current BMI of 32.7 Katherine Smith is currently in the action stage of change. As such, her goal is to continue with weight loss efforts. She has agreed to the Category 2 Plan.   We discussed various medication options to help Katherine Smith with her weight loss efforts and we both agreed to increase Wegovy to 2.4 mg.  Exercise goals: As is.  Behavioral modification strategies: decreasing eating out and no skipping meals.  Katherine Smith has agreed to follow-up with our clinic in 3 to 4 weeks. She was informed of the importance of frequent follow-up visits to maximize her success with intensive lifestyle modifications for her multiple health conditions.   Objective:   Blood pressure 100/66, pulse 68, temperature 97.8 F (36.6 C), height 5\' 3"  (1.6 m), weight 184 lb (83.5 kg), last menstrual period 04/14/2016, SpO2 97 %. Body mass index is 32.59 kg/m.  General: Cooperative, alert, well developed, in no acute distress. HEENT: Conjunctivae and lids unremarkable. Cardiovascular: Regular rhythm.  Lungs: Normal work of breathing. Neurologic: No focal deficits.   Lab Results  Component Value Date   CREATININE 0.93 07/07/2020   BUN 17 07/07/2020   NA 144 07/07/2020   K 4.1 07/07/2020   CL 104 07/07/2020   CO2 25 07/07/2020   Lab Results  Component Value Date   ALT 16 07/07/2020   AST 19 07/07/2020  ALKPHOS 88 07/07/2020   BILITOT 0.5 07/07/2020   Lab Results  Component Value Date   HGBA1C 5.2 07/07/2020   HGBA1C 4.9 02/25/2020   HGBA1C 5.2 08/05/2019   HGBA1C 5.1 11/23/2016   HGBA1C 5.3 08/10/2016   Lab Results  Component Value Date   INSULIN 13.9 07/07/2020   INSULIN 19.3 02/25/2020   INSULIN 16.3 08/05/2019    INSULIN 25.8 (H) 05/08/2019   INSULIN 46.2 (H) 10/02/2018   Lab Results  Component Value Date   TSH 1.340 07/07/2020   Lab Results  Component Value Date   CHOL 254 (H) 07/07/2020   HDL 60 07/07/2020   LDLCALC 174 (H) 07/07/2020   TRIG 115 07/07/2020   Lab Results  Component Value Date   VD25OH 44.0 07/07/2020   VD25OH 40.1 02/25/2020   VD25OH 37.6 08/05/2019   Lab Results  Component Value Date   WBC 5.5 07/07/2020   HGB 13.5 07/07/2020   HCT 41.1 07/07/2020   MCV 85 07/07/2020   PLT 297 07/07/2020   No results found for: IRON, TIBC, FERRITIN  Attestation Statements:   Reviewed by clinician on day of visit: allergies, medications, problem list, medical history, surgical history, family history, social history, and previous encounter notes.   I, Trixie Dredge, am acting as transcriptionist for Dennard Nip, MD.  I have reviewed the above documentation for accuracy and completeness, and I agree with the above. -  Dennard Nip, MD

## 2021-06-09 ENCOUNTER — Other Ambulatory Visit: Payer: Self-pay

## 2021-06-09 ENCOUNTER — Other Ambulatory Visit (INDEPENDENT_AMBULATORY_CARE_PROVIDER_SITE_OTHER): Payer: Self-pay

## 2021-06-09 ENCOUNTER — Encounter (INDEPENDENT_AMBULATORY_CARE_PROVIDER_SITE_OTHER): Payer: Self-pay | Admitting: Family Medicine

## 2021-06-09 MED ORDER — SEMAGLUTIDE-WEIGHT MANAGEMENT 1.7 MG/0.75ML ~~LOC~~ SOAJ
SUBCUTANEOUS | 0 refills | Status: DC
Start: 2021-06-09 — End: 2021-06-14
  Filled 2021-06-09: qty 3, 28d supply, fill #0

## 2021-06-09 NOTE — Telephone Encounter (Signed)
LAST APPOINTMENT DATE: 06/08/21  NEXT APPOINTMENT DATE: 07/13/21   [facility] [facility] [place] [address] Phone: [phone] Fax: [phone]  [id number] [place], [place] [address] [place] [address] Phone: [phone] Fax: [phone]  Patient is requesting a refill of the following medications: Requested Prescriptions   Pending Prescriptions Disp Refills   Semaglutide-Weight Management 1.7 MG/0.75ML SOAJ 3 mL 0    Sig: INJECT 1.7MG  UNDER THE SKIN ONCE A WEEK    Date last filled: 05/04/21 Previously prescribed by Dr Leafy Ro  Lab Results  Component Value Date   HGBA1C 5.2 07/07/2020   HGBA1C 4.9 02/25/2020   HGBA1C 5.2 08/05/2019   Lab Results  Component Value Date   LDLCALC 174 (H) 07/07/2020   CREATININE 0.93 07/07/2020   Lab Results  Component Value Date   VD25OH 44.0 07/07/2020   VD25OH 40.1 02/25/2020   VD25OH 37.6 08/05/2019    BP Readings from Last 3 Encounters:  06/08/21 100/66  05/04/21 107/70  04/06/21 104/71

## 2021-06-10 ENCOUNTER — Other Ambulatory Visit: Payer: Self-pay

## 2021-06-14 ENCOUNTER — Other Ambulatory Visit: Payer: Self-pay

## 2021-06-14 ENCOUNTER — Encounter (INDEPENDENT_AMBULATORY_CARE_PROVIDER_SITE_OTHER): Payer: Self-pay | Admitting: Family Medicine

## 2021-06-14 MED ORDER — SEMAGLUTIDE-WEIGHT MANAGEMENT 2.4 MG/0.75ML ~~LOC~~ SOAJ
2.4000 mg | SUBCUTANEOUS | 0 refills | Status: DC
  Filled 2021-06-14 – 2021-06-16 (×2): qty 3, 28d supply, fill #0

## 2021-06-14 NOTE — Telephone Encounter (Signed)
Pt last seen by Dr. Beasley.  

## 2021-06-14 NOTE — Telephone Encounter (Signed)
Per Dr. Leafy Ro, We discussed various medication options to help Katherine Smith with her weight loss efforts and we both agreed to increase Wegovy to 2.4 mg. Ok to send?

## 2021-06-14 NOTE — Telephone Encounter (Signed)
Please send it in

## 2021-06-14 NOTE — Telephone Encounter (Signed)
Medication is pinned for [facility] 2.4.

## 2021-06-15 ENCOUNTER — Encounter (INDEPENDENT_AMBULATORY_CARE_PROVIDER_SITE_OTHER): Payer: Self-pay

## 2021-06-16 ENCOUNTER — Other Ambulatory Visit: Payer: Self-pay

## 2021-06-29 DIAGNOSIS — Z01419 Encounter for gynecological examination (general) (routine) without abnormal findings: Secondary | ICD-10-CM | POA: Diagnosis not present

## 2021-06-29 DIAGNOSIS — Z1331 Encounter for screening for depression: Secondary | ICD-10-CM | POA: Diagnosis not present
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
## 2021-07-13 ENCOUNTER — Encounter (INDEPENDENT_AMBULATORY_CARE_PROVIDER_SITE_OTHER): Payer: Self-pay | Admitting: Family Medicine

## 2021-07-13 ENCOUNTER — Other Ambulatory Visit: Payer: Self-pay

## 2021-07-13 ENCOUNTER — Ambulatory Visit (INDEPENDENT_AMBULATORY_CARE_PROVIDER_SITE_OTHER): Payer: 59 | Admitting: Family Medicine

## 2021-07-13 VITALS — BP 107/71 | HR 68 | Temp 97.5°F | Ht 63.0 in | Wt 182.0 lb

## 2021-07-13 DIAGNOSIS — Z9189 Other specified personal risk factors, not elsewhere classified: Secondary | ICD-10-CM

## 2021-07-13 DIAGNOSIS — Z6841 Body Mass Index (BMI) 40.0 and over, adult: Secondary | ICD-10-CM

## 2021-07-13 DIAGNOSIS — E559 Vitamin D deficiency, unspecified: Secondary | ICD-10-CM

## 2021-07-13 DIAGNOSIS — F3289 Other specified depressive episodes: Secondary | ICD-10-CM

## 2021-07-13 MED ORDER — SEMAGLUTIDE-WEIGHT MANAGEMENT 2.4 MG/0.75ML ~~LOC~~ SOAJ
2.4000 mg | SUBCUTANEOUS | 0 refills | Status: DC
  Filled 2021-07-13: qty 3, 28d supply, fill #0

## 2021-07-13 MED ORDER — ESCITALOPRAM OXALATE 20 MG PO TABS
ORAL_TABLET | Freq: Every morning | ORAL | 0 refills | Status: DC
  Filled 2021-07-13: qty 30, 30d supply, fill #0

## 2021-07-13 MED ORDER — VITAMIN D (ERGOCALCIFEROL) 1.25 MG (50000 UNIT) PO CAPS
ORAL_CAPSULE | ORAL | 0 refills | Status: DC
  Filled 2021-07-13: qty 4, 28d supply, fill #0

## 2021-07-13 NOTE — Progress Notes (Signed)
Chief Complaint:   OBESITY Katherine Smith is here to discuss her progress with her obesity treatment plan along with follow-up of her obesity related diagnoses. Katherine Smith is on the Category 2 Plan and states she is following her eating plan approximately 70% of the time. Katherine Smith states she is doing 0 minutes 0 times per week.  Today's visit was #: 36 Starting weight: 240 lbs Starting date: 08/10/2016 Today's weight: 182 lbs Today's date: 07/13/2021 Total lbs lost to date: 82 Total lbs lost since last in-office visit: 2  Interim History: Katherine Smith continues to do well with weight loss despite extra challenge with her husbands health and being of her normal routine.  Subjective:   1. Vitamin D deficiency Katherine Smith on Vit D with no side effects noted.  2. Other depression with emotional eating Katherine Smith has had some increased stress recently, but she appears to be manage it well. She is mindful of her emotional eating behaviors.  3. At risk for heart disease Katherine Smith is at a higher than average risk for cardiovascular disease due to obesity.   Assessment/Plan:   1. Vitamin D deficiency Low Vitamin D level contributes to fatigue and are associated with obesity, breast, and colon cancer. We will refill prescription Vitamin D for 1 month. Katherine Smith will follow-up for routine testing of Vitamin D, at least 2-3 times per year to avoid over-replacement.  - Vitamin D, Ergocalciferol, (DRISDOL) 1.25 MG (50000 UNIT) CAPS capsule; TAKE 1 CAPSULE BY MOUTH EVERY 7 DAYS  Dispense: 4 capsule; Refill: 0  2. Other depression with emotional eating Behavior modification techniques were discussed today to help Katherine Smith deal with her emotional/non-hunger eating behaviors. We will refill Lexapro for 1 month. Orders and follow up as documented in patient record.   - escitalopram (LEXAPRO) 20 MG tablet; TAKE 1 TABLET BY MOUTH EVERY MORNING  Dispense: 30 tablet; Refill: 0  3. At risk for heart disease Katherine Smith was given  approximately 15 minutes of coronary artery disease prevention counseling today. She is 59 y.o. female and has risk factors for heart disease including obesity. We discussed intensive lifestyle modifications today with an emphasis on specific weight loss instructions and strategies.   Repetitive spaced learning was employed today to elicit superior memory formation and behavioral change.  4. Obesity with current BMI of 32.5 Katherine Smith is currently in the action stage of change. As such, her goal is to continue with weight loss efforts. She has agreed to the Category 2 Plan.   We discussed various medication options to help Katherine Smith with her weight loss efforts and we both agreed to continue Katherine Smith, and we will refill for 1 month.  - Semaglutide-Weight Management 2.4 MG/0.75ML SOAJ; Inject 2.4 mg into the skin once a week.  Dispense: 3 mL; Refill: 0  Behavioral modification strategies: increasing lean protein intake and meal planning and cooking strategies.  Katherine Smith has agreed to follow-up with our clinic in 3 weeks. She was informed of the importance of frequent follow-up visits to maximize her success with intensive lifestyle modifications for her multiple health conditions.   Objective:   Blood pressure 107/71, pulse 68, temperature (!) 97.5 F (36.4 C), height 5\' 3"  (1.6 m), weight 182 lb (82.6 kg), last menstrual period 04/14/2016, SpO2 98 %. Body mass index is 32.24 kg/m.  General: Cooperative, alert, well developed, in no acute distress. HEENT: Conjunctivae and lids unremarkable. Cardiovascular: Regular rhythm.  Lungs: Normal work of breathing. Neurologic: No focal deficits.   Lab Results  Component Value Date  CREATININE 0.93 07/07/2020   BUN 17 07/07/2020   NA 144 07/07/2020   K 4.1 07/07/2020   CL 104 07/07/2020   CO2 25 07/07/2020   Lab Results  Component Value Date   ALT 16 07/07/2020   AST 19 07/07/2020   ALKPHOS 88 07/07/2020   BILITOT 0.5 07/07/2020   Lab Results   Component Value Date   HGBA1C 5.2 07/07/2020   HGBA1C 4.9 02/25/2020   HGBA1C 5.2 08/05/2019   HGBA1C 5.1 11/23/2016   HGBA1C 5.3 08/10/2016   Lab Results  Component Value Date   INSULIN 13.9 07/07/2020   INSULIN 19.3 02/25/2020   INSULIN 16.3 08/05/2019   INSULIN 25.8 (H) 05/08/2019   INSULIN 46.2 (H) 10/02/2018   Lab Results  Component Value Date   TSH 1.340 07/07/2020   Lab Results  Component Value Date   CHOL 254 (H) 07/07/2020   HDL 60 07/07/2020   LDLCALC 174 (H) 07/07/2020   TRIG 115 07/07/2020   Lab Results  Component Value Date   VD25OH 44.0 07/07/2020   VD25OH 40.1 02/25/2020   VD25OH 37.6 08/05/2019   Lab Results  Component Value Date   WBC 5.5 07/07/2020   HGB 13.5 07/07/2020   HCT 41.1 07/07/2020   MCV 85 07/07/2020   PLT 297 07/07/2020   No results found for: IRON, TIBC, FERRITIN  Attestation Statements:   Reviewed by clinician on day of visit: allergies, medications, problem list, medical history, surgical history, family history, social history, and previous encounter notes.   I, Trixie Dredge, am acting as transcriptionist for Dennard Nip, MD.  I have reviewed the above documentation for accuracy and completeness, and I agree with the above. -  Dennard Nip, MD

## 2021-07-22 ENCOUNTER — Other Ambulatory Visit: Payer: Self-pay

## 2021-07-22 MED ORDER — LEVOTHYROXINE SODIUM 112 MCG PO TABS
ORAL_TABLET | ORAL | 1 refills | Status: DC
  Filled 2021-07-22: qty 90, 90d supply, fill #0
  Filled 2021-10-27: qty 90, 90d supply, fill #1

## 2021-08-03 ENCOUNTER — Other Ambulatory Visit: Payer: Self-pay

## 2021-08-03 ENCOUNTER — Ambulatory Visit (INDEPENDENT_AMBULATORY_CARE_PROVIDER_SITE_OTHER): Payer: 59 | Admitting: Family Medicine

## 2021-08-03 ENCOUNTER — Encounter (INDEPENDENT_AMBULATORY_CARE_PROVIDER_SITE_OTHER): Payer: Self-pay | Admitting: Family Medicine

## 2021-08-03 VITALS — BP 95/64 | HR 74 | Temp 97.6°F | Ht 63.0 in | Wt 183.0 lb

## 2021-08-03 DIAGNOSIS — Z9189 Other specified personal risk factors, not elsewhere classified: Secondary | ICD-10-CM | POA: Diagnosis not present

## 2021-08-03 DIAGNOSIS — E559 Vitamin D deficiency, unspecified: Secondary | ICD-10-CM

## 2021-08-03 DIAGNOSIS — Z6841 Body Mass Index (BMI) 40.0 and over, adult: Secondary | ICD-10-CM | POA: Diagnosis not present

## 2021-08-03 DIAGNOSIS — E038 Other specified hypothyroidism: Secondary | ICD-10-CM | POA: Diagnosis not present

## 2021-08-03 MED ORDER — SEMAGLUTIDE-WEIGHT MANAGEMENT 2.4 MG/0.75ML ~~LOC~~ SOAJ
2.4000 mg | SUBCUTANEOUS | 0 refills | Status: DC
  Filled 2021-08-03: qty 3, 28d supply, fill #0
  Filled 2021-08-11: qty 9, 84d supply, fill #0
  Filled 2021-08-30: qty 6, 56d supply, fill #1

## 2021-08-03 MED ORDER — VITAMIN D (ERGOCALCIFEROL) 1.25 MG (50000 UNIT) PO CAPS
ORAL_CAPSULE | ORAL | 0 refills | Status: DC
  Filled 2021-08-03: qty 4, 28d supply, fill #0

## 2021-08-03 MED ORDER — LEVOTHYROXINE SODIUM 112 MCG PO TABS
ORAL_TABLET | ORAL | 0 refills | Status: DC
  Filled 2021-08-03: qty 30, 30d supply, fill #0

## 2021-08-03 NOTE — Progress Notes (Signed)
Chief Complaint:   OBESITY Katherine Smith is here to discuss her progress with her obesity treatment plan along with follow-up of her obesity related diagnoses. Katherine Smith is on the Category 2 Plan and states she is following her eating plan approximately 50% of the time. Katherine Smith states she is walking for 30 minutes 4 times per week.  Today's visit was #: 35 Starting weight: 240 lbs Starting date: 08/10/2016 Today's weight: 183 lbs Today's date: 08/03/2021 Total lbs lost to date: 78 Total lbs lost since last in-office visit: 0  Interim History: Katherine Smith is doing well with diet and exercise. She is mindful of her food choices and she is doing well with portion control.  Subjective:   1. Other specified hypothyroidism Katherine Smith is stable on levothyroxine with no side effects noted. She denies tachycardia or tremors.  2. Vitamin D deficiency Katherine Smith is on Vit D with no side effects noted. Her level is stable.  3. At risk for heart disease Katherine Smith is at a higher than average risk for cardiovascular disease due to obesity.   Assessment/Plan:   1. Other specified hypothyroidism Katherine Smith will continue levothyroxine, and we will refill for 1 month. Orders and follow up as documented in patient record.  Counseling Good thyroid control is important for overall health. Supratherapeutic thyroid levels are dangerous and will not improve weight loss results. Counseling: The correct way to take levothyroxine is fasting, with water, separated by at least 30 minutes from breakfast, and separated by more than 4 hours from calcium, iron, multivitamins, acid reflux medications (PPIs).   - levothyroxine (SYNTHROID) 112 MCG tablet; TAKE 1 TABLET (112 MCG TOTAL) BY MOUTH ONCE DAILY TAKE ON AN EMPTY STOMACH WITH A GLASS OF WATER AT LEAST 30-60 MINUTES BEFORE BREAKFAST.  Dispense: 30 tablet; Refill: 0  2. Vitamin D deficiency Low Vitamin D level contributes to fatigue and are associated with obesity, breast, and  colon cancer. We will refill prescription Vitamin D for 1 month. Katherine Smith will follow-up for routine testing of Vitamin D, at least 2-3 times per year to avoid over-replacement.  - Vitamin D, Ergocalciferol, (DRISDOL) 1.25 MG (50000 UNIT) CAPS capsule; TAKE 1 CAPSULE BY MOUTH EVERY 7 DAYS  Dispense: 4 capsule; Refill: 0  3. At risk for heart disease Katherine Smith was given approximately 15 minutes of coronary artery disease prevention counseling today. She is 60 y.o. female and has risk factors for heart disease including obesity. We discussed intensive lifestyle modifications today with an emphasis on specific weight loss instructions and strategies.   Repetitive spaced learning was employed today to elicit superior memory formation and behavioral change.  4. Obesity with current BMI of 32.5 Katherine Smith is currently in the action stage of change. As such, her goal is to continue with weight loss efforts. She has agreed to the Category 2 Plan.   We discussed various medication options to help Katherine Smith with her weight loss efforts and we both agreed to continue [place], and we will for 90 days with no refills.  - Semaglutide-Weight Management 2.4 MG/0.75ML SOAJ; Inject 2.4 mg into the skin once a week.  Dispense: 9 mL; Refill: 0  Exercise goals: As is.  Behavioral modification strategies: increasing lean protein intake and meal planning and cooking strategies.  Katherine Smith has agreed to follow-up with our clinic in 4 weeks. She was informed of the importance of frequent follow-up visits to maximize her success with intensive lifestyle modifications for her multiple health conditions.   Objective:   Blood pressure 95/64,  pulse 74, temperature 97.6 F (36.4 C), height 5\' 3"  (1.6 m), weight 183 lb (83 kg), last menstrual period 04/14/2016, SpO2 99 %. Body mass index is 32.42 kg/m.  General: Cooperative, alert, well developed, in no acute distress. HEENT: Conjunctivae and lids unremarkable. Cardiovascular:  Regular rhythm.  Lungs: Normal work of breathing. Neurologic: No focal deficits.   Lab Results  Component Value Date   CREATININE 0.93 07/07/2020   BUN 17 07/07/2020   NA 144 07/07/2020   K 4.1 07/07/2020   CL 104 07/07/2020   CO2 25 07/07/2020   Lab Results  Component Value Date   ALT 16 07/07/2020   AST 19 07/07/2020   ALKPHOS 88 07/07/2020   BILITOT 0.5 07/07/2020   Lab Results  Component Value Date   HGBA1C 5.2 07/07/2020   HGBA1C 4.9 02/25/2020   HGBA1C 5.2 08/05/2019   HGBA1C 5.1 11/23/2016   HGBA1C 5.3 08/10/2016   Lab Results  Component Value Date   INSULIN 13.9 07/07/2020   INSULIN 19.3 02/25/2020   INSULIN 16.3 08/05/2019   INSULIN 25.8 (H) 05/08/2019   INSULIN 46.2 (H) 10/02/2018   Lab Results  Component Value Date   TSH 1.340 07/07/2020   Lab Results  Component Value Date   CHOL 254 (H) 07/07/2020   HDL 60 07/07/2020   LDLCALC 174 (H) 07/07/2020   TRIG 115 07/07/2020   Lab Results  Component Value Date   VD25OH 44.0 07/07/2020   VD25OH 40.1 02/25/2020   VD25OH 37.6 08/05/2019   Lab Results  Component Value Date   WBC 5.5 07/07/2020   HGB 13.5 07/07/2020   HCT 41.1 07/07/2020   MCV 85 07/07/2020   PLT 297 07/07/2020   No results found for: IRON, TIBC, FERRITIN  Attestation Statements:   Reviewed by clinician on day of visit: allergies, medications, problem list, medical history, surgical history, family history, social history, and previous encounter notes.   I, Trixie Dredge, am acting as transcriptionist for Dennard Nip, MD.  I have reviewed the above documentation for accuracy and completeness, and I agree with the above. -  Dennard Nip, MD

## 2021-08-04 ENCOUNTER — Other Ambulatory Visit: Payer: Self-pay

## 2021-08-04 ENCOUNTER — Other Ambulatory Visit (INDEPENDENT_AMBULATORY_CARE_PROVIDER_SITE_OTHER): Payer: Self-pay | Admitting: Family Medicine

## 2021-08-04 DIAGNOSIS — F3289 Other specified depressive episodes: Secondary | ICD-10-CM

## 2021-08-04 MED ORDER — ESCITALOPRAM OXALATE 20 MG PO TABS
ORAL_TABLET | Freq: Every morning | ORAL | 0 refills | Status: DC
  Filled 2021-08-04 – 2021-08-09 (×2): qty 30, 30d supply, fill #0

## 2021-08-04 NOTE — Telephone Encounter (Signed)
Pt last seen by Dr. Beasley.  

## 2021-08-04 NOTE — Telephone Encounter (Signed)
LAST APPOINTMENT DATE: 08/03/21 NEXT APPOINTMENT DATE: 08/24/21   [place] [facility] [place] [address] Phone: [phone] Fax: [phone]  [id number] [place], [place] [address] [place] [address] Phone: [phone] Fax: [phone]  Patient is requesting a refill of the following medications: Requested Prescriptions   Pending Prescriptions Disp Refills   escitalopram (LEXAPRO) 20 MG tablet 30 tablet 0    Sig: TAKE 1 TABLET BY MOUTH EVERY MORNING    Date last filled: 07/13/21 Previously prescribed by Dr. Leafy Ro  Lab Results  Component Value Date   HGBA1C 5.2 07/07/2020   HGBA1C 4.9 02/25/2020   HGBA1C 5.2 08/05/2019   Lab Results  Component Value Date   LDLCALC 174 (H) 07/07/2020   CREATININE 0.93 07/07/2020   Lab Results  Component Value Date   VD25OH 44.0 07/07/2020   VD25OH 40.1 02/25/2020   VD25OH 37.6 08/05/2019    BP Readings from Last 3 Encounters:  08/03/21 95/64  07/13/21 107/71  06/08/21 100/66

## 2021-08-05 ENCOUNTER — Other Ambulatory Visit: Payer: Self-pay

## 2021-08-09 ENCOUNTER — Other Ambulatory Visit: Payer: Self-pay

## 2021-08-09 MED ORDER — PANTOPRAZOLE SODIUM 40 MG PO TBEC
40.0000 mg | DELAYED_RELEASE_TABLET | Freq: Every day | ORAL | 1 refills | Status: DC
  Filled 2021-08-09: qty 90, 90d supply, fill #0
  Filled 2021-11-13: qty 90, 90d supply, fill #1

## 2021-08-11 ENCOUNTER — Other Ambulatory Visit: Payer: Self-pay

## 2021-08-15 ENCOUNTER — Other Ambulatory Visit: Payer: Self-pay

## 2021-08-15 MED ORDER — CARESTART COVID-19 HOME TEST VI KIT
PACK | 0 refills | Status: DC
  Filled 2021-08-15: qty 2, 4d supply, fill #0

## 2021-08-24 ENCOUNTER — Encounter (INDEPENDENT_AMBULATORY_CARE_PROVIDER_SITE_OTHER): Payer: Self-pay | Admitting: Family Medicine

## 2021-08-24 ENCOUNTER — Other Ambulatory Visit: Payer: Self-pay

## 2021-08-24 ENCOUNTER — Ambulatory Visit (INDEPENDENT_AMBULATORY_CARE_PROVIDER_SITE_OTHER): Payer: 59 | Admitting: Family Medicine

## 2021-08-24 VITALS — BP 105/70 | HR 72 | Temp 97.8°F | Ht 63.0 in | Wt 181.0 lb

## 2021-08-24 DIAGNOSIS — Z6833 Body mass index (BMI) 33.0-33.9, adult: Secondary | ICD-10-CM

## 2021-08-24 DIAGNOSIS — Z9189 Other specified personal risk factors, not elsewhere classified: Secondary | ICD-10-CM | POA: Diagnosis not present

## 2021-08-24 DIAGNOSIS — F3289 Other specified depressive episodes: Secondary | ICD-10-CM | POA: Diagnosis not present

## 2021-08-24 DIAGNOSIS — E559 Vitamin D deficiency, unspecified: Secondary | ICD-10-CM

## 2021-08-24 MED ORDER — VITAMIN D (ERGOCALCIFEROL) 1.25 MG (50000 UNIT) PO CAPS
ORAL_CAPSULE | ORAL | 0 refills | Status: DC
  Filled 2021-08-24: qty 4, 28d supply, fill #0

## 2021-08-24 MED ORDER — ESCITALOPRAM OXALATE 20 MG PO TABS
ORAL_TABLET | Freq: Every morning | ORAL | 0 refills | Status: DC
  Filled 2021-08-24 – 2021-09-02 (×2): qty 30, 30d supply, fill #0

## 2021-08-25 NOTE — Progress Notes (Signed)
Chief Complaint:   OBESITY Katherine Smith is here to discuss her progress with her obesity treatment plan along with follow-up of her obesity related diagnoses. Katherine Smith is on the Category 2 Plan and states she is following her eating plan approximately 70% of the time. Katherine Smith states she is walking for 30 minutes 4 times per week.  Today's visit was #: 44 Starting weight: 240 lbs Starting date: 08/10/2016 Today's weight: 181 lbs Today's date: 08/24/2021 Total lbs lost to date: 59 Total lbs lost since last in-office visit: 2  Interim History: Katherine Smith continues to do very well over the holidays. She feels she gained a little, but she has already gotten back on track.  Subjective:   1. Vitamin D deficiency Katherine Smith is stable on Vit D with no side effects noted.  2. Other depression with emotional eating Katherine Smith has done well on Lexapro, and her mood is stable and she has done better with minimizing emotional eating behaviors.  3. At risk for heart disease Katherine Smith is at a higher than average risk for cardiovascular disease due to obesity.   Assessment/Plan:   1. Vitamin D deficiency Low Vitamin D level contributes to fatigue and are associated with obesity, breast, and colon cancer. We will refill prescription Vitamin D for 1 month. Katherine Smith will follow-up for routine testing of Vitamin D, at least 2-3 times per year to avoid over-replacement.  - Vitamin D, Ergocalciferol, (DRISDOL) 1.25 MG (50000 UNIT) CAPS capsule; TAKE 1 CAPSULE BY MOUTH EVERY 7 DAYS  Dispense: 4 capsule; Refill: 0  2. Other depression with emotional eating Behavior modification techniques were discussed today to help Katherine Smith deal with her emotional/non-hunger eating behaviors. We will refill Lexapro for 1 month. Orders and follow up as documented in patient record.   - escitalopram (LEXAPRO) 20 MG tablet; TAKE 1 TABLET BY MOUTH EVERY MORNING  Dispense: 30 tablet; Refill: 0  3. At risk for heart disease Katherine Smith was given  approximately 15 minutes of coronary artery disease prevention counseling today. She is 61 y.o. female and has risk factors for heart disease including obesity. We discussed intensive lifestyle modifications today with an emphasis on specific weight loss instructions and strategies.   Repetitive spaced learning was employed today to elicit superior memory formation and behavioral change.  4. Obesity with current BMI of 32.2 Katherine Smith is currently in the action stage of change. As such, her goal is to continue with weight loss efforts. She has agreed to the Category 2 Plan.   Exercise goals: As is.  Behavioral modification strategies: increasing lean protein intake and meal planning and cooking strategies.  Katherine Smith has agreed to follow-up with our clinic in 3 to 4 weeks. She was informed of the importance of frequent follow-up visits to maximize her success with intensive lifestyle modifications for her multiple health conditions.   Objective:   Blood pressure 105/70, pulse 72, temperature 97.8 F (36.6 C), temperature source Oral, height 5\' 3"  (1.6 m), weight 181 lb (82.1 kg), last menstrual period 04/14/2016, SpO2 98 %. Body mass index is 32.06 kg/m.  General: Cooperative, alert, well developed, in no acute distress. HEENT: Conjunctivae and lids unremarkable. Cardiovascular: Regular rhythm.  Lungs: Normal work of breathing. Neurologic: No focal deficits.   Lab Results  Component Value Date   CREATININE 0.93 07/07/2020   BUN 17 07/07/2020   NA 144 07/07/2020   K 4.1 07/07/2020   CL 104 07/07/2020   CO2 25 07/07/2020   Lab Results  Component Value Date  ALT 16 07/07/2020   AST 19 07/07/2020   ALKPHOS 88 07/07/2020   BILITOT 0.5 07/07/2020   Lab Results  Component Value Date   HGBA1C 5.2 07/07/2020   HGBA1C 4.9 02/25/2020   HGBA1C 5.2 08/05/2019   HGBA1C 5.1 11/23/2016   HGBA1C 5.3 08/10/2016   Lab Results  Component Value Date   INSULIN 13.9 07/07/2020   INSULIN 19.3  02/25/2020   INSULIN 16.3 08/05/2019   INSULIN 25.8 (H) 05/08/2019   INSULIN 46.2 (H) 10/02/2018   Lab Results  Component Value Date   TSH 1.340 07/07/2020   Lab Results  Component Value Date   CHOL 254 (H) 07/07/2020   HDL 60 07/07/2020   LDLCALC 174 (H) 07/07/2020   TRIG 115 07/07/2020   Lab Results  Component Value Date   VD25OH 44.0 07/07/2020   VD25OH 40.1 02/25/2020   VD25OH 37.6 08/05/2019   Lab Results  Component Value Date   WBC 5.5 07/07/2020   HGB 13.5 07/07/2020   HCT 41.1 07/07/2020   MCV 85 07/07/2020   PLT 297 07/07/2020   No results found for: IRON, TIBC, FERRITIN  Attestation Statements:   Reviewed by clinician on day of visit: allergies, medications, problem list, medical history, surgical history, family history, social history, and previous encounter notes.   I, Trixie Dredge, am acting as transcriptionist for Dennard Nip, MD.  I have reviewed the above documentation for accuracy and completeness, and I agree with the above. -  Dennard Nip, MD

## 2021-08-26 ENCOUNTER — Other Ambulatory Visit: Payer: Self-pay

## 2021-08-30 ENCOUNTER — Other Ambulatory Visit: Payer: Self-pay

## 2021-08-31 ENCOUNTER — Other Ambulatory Visit: Payer: Self-pay

## 2021-08-31 DIAGNOSIS — E039 Hypothyroidism, unspecified: Secondary | ICD-10-CM | POA: Diagnosis not present

## 2021-08-31 DIAGNOSIS — E782 Mixed hyperlipidemia: Secondary | ICD-10-CM | POA: Diagnosis not present

## 2021-09-01 ENCOUNTER — Other Ambulatory Visit: Payer: Self-pay

## 2021-09-02 ENCOUNTER — Other Ambulatory Visit: Payer: Self-pay

## 2021-09-13 ENCOUNTER — Other Ambulatory Visit: Payer: Self-pay

## 2021-09-14 ENCOUNTER — Ambulatory Visit (INDEPENDENT_AMBULATORY_CARE_PROVIDER_SITE_OTHER): Payer: 59 | Admitting: Family Medicine

## 2021-09-14 DIAGNOSIS — K219 Gastro-esophageal reflux disease without esophagitis: Secondary | ICD-10-CM | POA: Diagnosis not present

## 2021-09-14 DIAGNOSIS — E039 Hypothyroidism, unspecified: Secondary | ICD-10-CM | POA: Diagnosis not present

## 2021-09-14 DIAGNOSIS — E782 Mixed hyperlipidemia: Secondary | ICD-10-CM | POA: Diagnosis not present

## 2021-09-28 ENCOUNTER — Other Ambulatory Visit: Payer: Self-pay | Admitting: Obstetrics and Gynecology

## 2021-09-28 DIAGNOSIS — Z1231 Encounter for screening mammogram for malignant neoplasm of breast: Secondary | ICD-10-CM

## 2021-10-11 ENCOUNTER — Other Ambulatory Visit: Payer: Self-pay

## 2021-10-11 ENCOUNTER — Ambulatory Visit (INDEPENDENT_AMBULATORY_CARE_PROVIDER_SITE_OTHER): Payer: 59 | Admitting: Family Medicine

## 2021-10-11 ENCOUNTER — Encounter (INDEPENDENT_AMBULATORY_CARE_PROVIDER_SITE_OTHER): Payer: Self-pay | Admitting: Family Medicine

## 2021-10-11 VITALS — BP 104/68 | HR 86 | Temp 97.9°F | Ht 63.0 in | Wt 178.0 lb

## 2021-10-11 DIAGNOSIS — F3289 Other specified depressive episodes: Secondary | ICD-10-CM

## 2021-10-11 DIAGNOSIS — E669 Obesity, unspecified: Secondary | ICD-10-CM

## 2021-10-11 DIAGNOSIS — Z9189 Other specified personal risk factors, not elsewhere classified: Secondary | ICD-10-CM | POA: Diagnosis not present

## 2021-10-11 DIAGNOSIS — E559 Vitamin D deficiency, unspecified: Secondary | ICD-10-CM

## 2021-10-11 DIAGNOSIS — Z6831 Body mass index (BMI) 31.0-31.9, adult: Secondary | ICD-10-CM | POA: Diagnosis not present

## 2021-10-11 DIAGNOSIS — Z6841 Body Mass Index (BMI) 40.0 and over, adult: Secondary | ICD-10-CM

## 2021-10-11 MED ORDER — VITAMIN D (ERGOCALCIFEROL) 1.25 MG (50000 UNIT) PO CAPS
ORAL_CAPSULE | ORAL | 0 refills | Status: DC
  Filled 2021-10-11: qty 4, 28d supply, fill #0

## 2021-10-11 MED ORDER — SEMAGLUTIDE-WEIGHT MANAGEMENT 2.4 MG/0.75ML ~~LOC~~ SOAJ
2.4000 mg | SUBCUTANEOUS | 0 refills | Status: DC
  Filled 2021-10-11: qty 3, 28d supply, fill #0
  Filled 2021-11-10 – 2021-12-06 (×2): qty 3, 28d supply, fill #1

## 2021-10-11 MED ORDER — ESCITALOPRAM OXALATE 20 MG PO TABS
ORAL_TABLET | Freq: Every morning | ORAL | 0 refills | Status: DC
  Filled 2021-10-11: qty 30, 30d supply, fill #0

## 2021-10-11 NOTE — Progress Notes (Signed)
Chief Complaint:   OBESITY Katherine Smith is here to discuss her progress with her obesity treatment plan along with follow-up of her obesity related diagnoses. Katherine Smith is on the Category 2 Plan and states she is following her eating plan approximately 75% of the time. Matthew states she is walking for 30 minutes and weight training for 20 minutes 6 times per week.  Today's visit was #: 56 Starting weight: 240 lbs Starting date: 08/10/2016 Today's weight: 178 lbs Today's date: 10/11/2021 Total lbs lost to date: 62 Total lbs lost since last in-office visit: 3  Interim History: Katherine Smith continues to do very well with weight loss. She is tole[rating [id number] well with no side effects noted.  Subjective:   1. Vitamin D deficiency Katherine Smith is on Vitamin D, and she is stable with no side effects noted.  2. Other depression with emotional eating Katherine Smith's mood is stable on Lexapro. She is doing well with minimizing emotional eating behaviors. No side effects noted.  3. At risk for heart disease Katherine Smith is at higher than average risk for cardiovascular disease due to obesity.  Assessment/Plan:   1. Vitamin D deficiency Katherine Smith will continue prescription Vitamin D 50,000 IU every week, and we will refill for 1 month. She will follow-up for routine testing of Vitamin D, at least 2-3 times per year to avoid over-replacement.  - Vitamin D, Ergocalciferol, (DRISDOL) 1.25 MG (50000 UNIT) CAPS capsule; TAKE 1 CAPSULE BY MOUTH EVERY 7 DAYS  Dispense: 4 capsule; Refill: 0  2. Other depression with emotional eating Behavior modification techniques were discussed today to help Katherine Smith deal with her emotional/non-hunger eating behaviors. We will refill Lexapro for 1 month. Orders and follow up as documented in patient record.   - escitalopram (LEXAPRO) 20 MG tablet; TAKE 1 TABLET BY MOUTH EVERY MORNING  Dispense: 30 tablet; Refill: 0  3. At risk for heart disease Katherine Smith was given approximately 15 minutes of  coronary artery disease prevention counseling today. She is 61 y.o. female and has risk factors for heart disease including obesity. We discussed intensive lifestyle modifications today with an emphasis on specific weight loss instructions and strategies.  Repetitive spaced learning was employed today to elicit superior memory formation and behavioral change.   4. Obesity with current BMI of 31.6 Katherine Smith is currently in the action stage of change. As such, her goal is to continue with weight loss efforts. She has agreed to the Category 2 Plan.   We discussed various medication options to help Katherine Smith with her weight loss efforts and we both agreed to continue [place], and we will refill for 1 month.  - Semaglutide-Weight Management 2.4 MG/0.75ML SOAJ; Inject 2.4 mg into the skin once a week.  Dispense: 9 mL; Refill: 0  Exercise goals: As is.  Behavioral modification strategies: meal planning and cooking strategies.  Katherine Smith has agreed to follow-up with our clinic in 4 weeks. She was informed of the importance of frequent follow-up visits to maximize her success with intensive lifestyle modifications for her multiple health conditions.   Objective:   Blood pressure 104/68, pulse 86, temperature 97.9 F (36.6 C), height 5\' 3"  (1.6 m), weight 178 lb (80.7 kg), last menstrual period 04/14/2016, SpO2 98 %. Body mass index is 31.53 kg/m.  General: Cooperative, alert, well developed, in no acute distress. HEENT: Conjunctivae and lids unremarkable. Cardiovascular: Regular rhythm.  Lungs: Normal work of breathing. Neurologic: No focal deficits.   Lab Results  Component Value Date   CREATININE 0.93 07/07/2020  BUN 17 07/07/2020   NA 144 07/07/2020   K 4.1 07/07/2020   CL 104 07/07/2020   CO2 25 07/07/2020   Lab Results  Component Value Date   ALT 16 07/07/2020   AST 19 07/07/2020   ALKPHOS 88 07/07/2020   BILITOT 0.5 07/07/2020   Lab Results  Component Value Date   HGBA1C 5.2  07/07/2020   HGBA1C 4.9 02/25/2020   HGBA1C 5.2 08/05/2019   HGBA1C 5.1 11/23/2016   HGBA1C 5.3 08/10/2016   Lab Results  Component Value Date   INSULIN 13.9 07/07/2020   INSULIN 19.3 02/25/2020   INSULIN 16.3 08/05/2019   INSULIN 25.8 (H) 05/08/2019   INSULIN 46.2 (H) 10/02/2018   Lab Results  Component Value Date   TSH 1.340 07/07/2020   Lab Results  Component Value Date   CHOL 254 (H) 07/07/2020   HDL 60 07/07/2020   LDLCALC 174 (H) 07/07/2020   TRIG 115 07/07/2020   Lab Results  Component Value Date   VD25OH 44.0 07/07/2020   VD25OH 40.1 02/25/2020   VD25OH 37.6 08/05/2019   Lab Results  Component Value Date   WBC 5.5 07/07/2020   HGB 13.5 07/07/2020   HCT 41.1 07/07/2020   MCV 85 07/07/2020   PLT 297 07/07/2020   No results found for: IRON, TIBC, FERRITIN  Attestation Statements:   Reviewed by clinician on day of visit: allergies, medications, problem list, medical history, surgical history, family history, social history, and previous encounter notes.   I, Trixie Dredge, am acting as transcriptionist for Dennard Nip, MD.  I have reviewed the above documentation for accuracy and completeness, and I agree with the above. -  Dennard Nip, MD

## 2021-10-13 ENCOUNTER — Other Ambulatory Visit: Payer: Self-pay

## 2021-10-18 ENCOUNTER — Other Ambulatory Visit: Payer: Self-pay

## 2021-10-27 ENCOUNTER — Other Ambulatory Visit: Payer: Self-pay

## 2021-11-02 DIAGNOSIS — H43813 Vitreous degeneration, bilateral: Secondary | ICD-10-CM | POA: Diagnosis not present

## 2021-11-09 ENCOUNTER — Other Ambulatory Visit: Payer: Self-pay

## 2021-11-09 ENCOUNTER — Ambulatory Visit (INDEPENDENT_AMBULATORY_CARE_PROVIDER_SITE_OTHER): Payer: 59 | Admitting: Family Medicine

## 2021-11-09 ENCOUNTER — Encounter (INDEPENDENT_AMBULATORY_CARE_PROVIDER_SITE_OTHER): Payer: Self-pay | Admitting: Family Medicine

## 2021-11-09 ENCOUNTER — Ambulatory Visit
Admission: RE | Admit: 2021-11-09 | Discharge: 2021-11-09 | Disposition: A | Discharge status: Home / Self Care | Payer: 59 | Source: Ambulatory Visit | Attending: Obstetrics and Gynecology | Admitting: Obstetrics and Gynecology

## 2021-11-09 VITALS — BP 97/67 | HR 79 | Temp 97.8°F | Ht 63.0 in | Wt 178.0 lb

## 2021-11-09 DIAGNOSIS — F3289 Other specified depressive episodes: Secondary | ICD-10-CM

## 2021-11-09 DIAGNOSIS — E559 Vitamin D deficiency, unspecified: Secondary | ICD-10-CM

## 2021-11-09 DIAGNOSIS — Z1231 Encounter for screening mammogram for malignant neoplasm of breast: Secondary | ICD-10-CM | POA: Diagnosis not present

## 2021-11-09 DIAGNOSIS — E669 Obesity, unspecified: Secondary | ICD-10-CM | POA: Diagnosis not present

## 2021-11-09 DIAGNOSIS — Z6831 Body mass index (BMI) 31.0-31.9, adult: Secondary | ICD-10-CM

## 2021-11-09 MED ORDER — ESCITALOPRAM OXALATE 20 MG PO TABS
ORAL_TABLET | Freq: Every morning | ORAL | 0 refills | Status: DC
  Filled 2021-11-09: qty 30, 30d supply, fill #0

## 2021-11-09 MED ORDER — VITAMIN D (ERGOCALCIFEROL) 1.25 MG (50000 UNIT) PO CAPS
ORAL_CAPSULE | ORAL | 0 refills | Status: DC
  Filled 2021-11-09: qty 4, 28d supply, fill #0

## 2021-11-10 ENCOUNTER — Other Ambulatory Visit: Payer: Self-pay

## 2021-11-11 ENCOUNTER — Other Ambulatory Visit: Payer: Self-pay

## 2021-11-13 ENCOUNTER — Other Ambulatory Visit: Payer: Self-pay

## 2021-11-14 ENCOUNTER — Other Ambulatory Visit: Payer: Self-pay

## 2021-11-14 MED ORDER — EZETIMIBE 10 MG PO TABS
10.0000 mg | ORAL_TABLET | Freq: Every day | ORAL | 1 refills | Status: DC
  Filled 2021-11-14: qty 90, 90d supply, fill #0
  Filled 2022-02-07: qty 90, 90d supply, fill #1

## 2021-11-14 NOTE — Progress Notes (Signed)
? ? ? ?Chief Complaint:  ? ?OBESITY ?Katherine Smith is here to discuss her progress with her obesity treatment plan along with follow-up of her obesity related diagnoses. Katherine Smith is on the Category 2 Plan and states she is following her eating plan approximately 50% of the time. Katherine Smith states she is walking and lifting weights for 30 minutes 4-5 times per week. ? ?Today's visit was #: 27 ?Starting weight: 240 lbs ?Starting date: 08/10/2016 ?Today's weight: 178 lbs ?Today's date: 11/09/2021 ?Total lbs lost to date: 87 ?Total lbs lost since last in-office visit: 0 ? ?Interim History: Katherine Smith has had extra challenges and temptations but she has still been able to stay on track and avoid weight gain.  ? ?Subjective:  ? ?1. Vitamin D deficiency ?Katherine Smith is stable on Vitamin D, and she has no signs of over-replacement.  ? ?2. Other depression with emotional eating ?Katherine Smith is stable on her medications. She is still dealing with increased temptations, but she is able to be mindful.  ? ?Assessment/Plan:  ? ?1. Vitamin D deficiency ?We will refill prescription Vitamin D for 1 month. Katherine Smith will follow-up for routine testing of Vitamin D, at least 2-3 times per year to avoid over-replacement. ? ?- Vitamin D, Ergocalciferol, (DRISDOL) 1.25 MG (50000 UNIT) CAPS capsule; TAKE 1 CAPSULE BY MOUTH EVERY 7 DAYS  Dispense: 4 capsule; Refill: 0 ? ?2. Other depression with emotional eating ?We will refill Lexapro for 1 month. Behavior modification techniques were discussed today to help Katherine Smith deal with her emotional/non-hunger eating behaviors. Orders and follow up as documented in patient record.  ? ?- escitalopram (LEXAPRO) 20 MG tablet; TAKE 1 TABLET BY MOUTH EVERY MORNING  Dispense: 30 tablet; Refill: 0 ? ?3. Obesity with current BMI of 31.6 ?Katherine Smith is currently in the action stage of change. As such, her goal is to continue with weight loss efforts. She has agreed to the Category 2 Plan.  ? ?Exercise goals: As is. ? ?Behavioral modification  strategies: increasing lean protein intake. ? ?Katherine Smith has agreed to follow-up with our clinic in 4 weeks. She was informed of the importance of frequent follow-up visits to maximize her success with intensive lifestyle modifications for her multiple health conditions.  ? ?Objective:  ? ?Blood pressure 97/67, pulse 79, temperature 97.8 ?F (36.6 ?C), height '5\' 3"'$  (1.6 m), weight 178 lb (80.7 kg), last menstrual period 04/14/2016, SpO2 97 %. ?Body mass index is 31.53 kg/m?. ? ?General: Cooperative, alert, well developed, in no acute distress. ?HEENT: Conjunctivae and lids unremarkable. ?Cardiovascular: Regular rhythm.  ?Lungs: Normal work of breathing. ?Neurologic: No focal deficits.  ? ?Lab Results  ?Component Value Date  ? CREATININE 0.93 07/07/2020  ? BUN 17 07/07/2020  ? NA 144 07/07/2020  ? K 4.1 07/07/2020  ? CL 104 07/07/2020  ? CO2 25 07/07/2020  ? ?Lab Results  ?Component Value Date  ? ALT 16 07/07/2020  ? AST 19 07/07/2020  ? ALKPHOS 88 07/07/2020  ? BILITOT 0.5 07/07/2020  ? ?Lab Results  ?Component Value Date  ? HGBA1C 5.2 07/07/2020  ? HGBA1C 4.9 02/25/2020  ? HGBA1C 5.2 08/05/2019  ? HGBA1C 5.1 11/23/2016  ? HGBA1C 5.3 08/10/2016  ? ?Lab Results  ?Component Value Date  ? INSULIN 13.9 07/07/2020  ? INSULIN 19.3 02/25/2020  ? INSULIN 16.3 08/05/2019  ? INSULIN 25.8 (H) 05/08/2019  ? INSULIN 46.2 (H) 10/02/2018  ? ?Lab Results  ?Component Value Date  ? TSH 1.340 07/07/2020  ? ?Lab Results  ?Component Value Date  ?  CHOL 254 (H) 07/07/2020  ? HDL 60 07/07/2020  ? LDLCALC 174 (H) 07/07/2020  ? TRIG 115 07/07/2020  ? ?Lab Results  ?Component Value Date  ? VD25OH 44.0 07/07/2020  ? VD25OH 40.1 02/25/2020  ? VD25OH 37.6 08/05/2019  ? ?Lab Results  ?Component Value Date  ? WBC 5.5 07/07/2020  ? HGB 13.5 07/07/2020  ? HCT 41.1 07/07/2020  ? MCV 85 07/07/2020  ? PLT 297 07/07/2020  ? ?No results found for: IRON, TIBC, FERRITIN ? ?Attestation Statements:  ? ?Reviewed by clinician on day of visit: allergies,  medications, problem list, medical history, surgical history, family history, social history, and previous encounter notes. ? ? ?I, Trixie Dredge, am acting as transcriptionist for Dennard Nip, MD. ? ?I have reviewed the above documentation for accuracy and completeness, and I agree with the above. -  Dennard Nip, MD ?  ?

## 2021-11-29 ENCOUNTER — Other Ambulatory Visit: Payer: Self-pay

## 2021-12-06 ENCOUNTER — Other Ambulatory Visit: Payer: Self-pay

## 2021-12-14 ENCOUNTER — Ambulatory Visit (INDEPENDENT_AMBULATORY_CARE_PROVIDER_SITE_OTHER): Payer: 59 | Admitting: Family Medicine

## 2021-12-21 ENCOUNTER — Other Ambulatory Visit: Payer: Self-pay

## 2021-12-21 ENCOUNTER — Ambulatory Visit (INDEPENDENT_AMBULATORY_CARE_PROVIDER_SITE_OTHER): Payer: 59 | Admitting: Family Medicine

## 2021-12-21 ENCOUNTER — Encounter (INDEPENDENT_AMBULATORY_CARE_PROVIDER_SITE_OTHER): Payer: Self-pay | Admitting: Family Medicine

## 2021-12-21 VITALS — BP 92/60 | HR 73 | Temp 97.6°F | Ht 63.0 in | Wt 180.0 lb

## 2021-12-21 DIAGNOSIS — F3289 Other specified depressive episodes: Secondary | ICD-10-CM | POA: Diagnosis not present

## 2021-12-21 DIAGNOSIS — E559 Vitamin D deficiency, unspecified: Secondary | ICD-10-CM | POA: Diagnosis not present

## 2021-12-21 DIAGNOSIS — Z6832 Body mass index (BMI) 32.0-32.9, adult: Secondary | ICD-10-CM | POA: Diagnosis not present

## 2021-12-21 DIAGNOSIS — Z9189 Other specified personal risk factors, not elsewhere classified: Secondary | ICD-10-CM | POA: Insufficient documentation

## 2021-12-21 DIAGNOSIS — E669 Obesity, unspecified: Secondary | ICD-10-CM

## 2021-12-21 MED ORDER — VITAMIN D (ERGOCALCIFEROL) 1.25 MG (50000 UNIT) PO CAPS
ORAL_CAPSULE | ORAL | 0 refills | Status: DC
  Filled 2021-12-21: qty 4, 28d supply, fill #0

## 2021-12-21 MED ORDER — COVID-19 AT HOME ANTIGEN TEST VI KIT
PACK | 0 refills | Status: DC
  Filled 2021-12-21: qty 2, 4d supply, fill #0

## 2021-12-21 MED ORDER — SEMAGLUTIDE-WEIGHT MANAGEMENT 2.4 MG/0.75ML ~~LOC~~ SOAJ
2.4000 mg | SUBCUTANEOUS | 0 refills | Status: DC
  Filled 2021-12-21: qty 3, 28d supply, fill #0
  Filled 2021-12-29: qty 9, 84d supply, fill #0

## 2021-12-21 MED ORDER — ESCITALOPRAM OXALATE 20 MG PO TABS
ORAL_TABLET | Freq: Every morning | ORAL | 0 refills | Status: DC
  Filled 2021-12-21: qty 30, 30d supply, fill #0

## 2021-12-28 ENCOUNTER — Other Ambulatory Visit: Payer: Self-pay

## 2021-12-29 ENCOUNTER — Other Ambulatory Visit: Payer: Self-pay

## 2022-01-04 NOTE — Progress Notes (Signed)
Chief Complaint:   OBESITY Katherine Smith is here to discuss her progress with her obesity treatment plan along with follow-up of her obesity related diagnoses. Katherine Smith is on the Category 2 Plan and states she is following her eating plan approximately 50% of the time. Katherine Smith states she is doing 0 minutes 0 times per week.  Today's visit was #: 28 Starting weight: 240 lbs Starting date: 08/10/2016 Today's weight: 180 lbs Today's date: 12/21/2021 Total lbs lost to date: 60 Total lbs lost since last in-office visit: 0  Interim History: Katherine Smith has increased her traveling and she is retaining 2 lbs of water weight today. She is stable on Wegovy and her hunger is stable but her cravings are still a bit of an issue.   Subjective:   1. Vitamin D deficiency Katherine Smith is stable on Vitamin D, and she requests a refill today.   2. Other depression with emotional eating Katherine Smith is stable on her medications. She is still working on decreasing emotional eating behaviors, and she is doing well. No side effects were noted.   3. At risk for impaired metabolic function Katherine Smith is at increased risk for impaired metabolic function if calories/protein decreases.  Assessment/Plan:   1. Vitamin D deficiency We will refill prescription Vitamin D for 1 month. Katherine Smith will follow-up for routine testing of Vitamin D, at least 2-3 times per year to avoid over-replacement.  - Vitamin D, Ergocalciferol, (DRISDOL) 1.25 MG (50000 UNIT) CAPS capsule; TAKE 1 CAPSULE BY MOUTH EVERY 7 DAYS  Dispense: 4 capsule; Refill: 0  2. Other depression with emotional eating We will refill Lexapro for 1 month. Behavior modification techniques were discussed today to help Katherine Smith deal with her emotional/non-hunger eating behaviors.  Orders and follow up as documented in patient record.   - escitalopram (LEXAPRO) 20 MG tablet; TAKE 1 TABLET BY MOUTH EVERY MORNING  Dispense: 30 tablet; Refill: 0  3. At risk for impaired metabolic  function Katherine Smith was given approximately 15 minutes of impaired  metabolic function prevention counseling today. We discussed intensive lifestyle modifications today with an emphasis on specific nutrition and exercise instructions and strategies.   Repetitive spaced learning was employed today to elicit superior memory formation and behavioral change.  4. Obesity, Current BMI 32.0 Katherine Smith is currently in the action stage of change. As such, her goal is to continue with weight loss efforts. She has agreed to the Category 2 Plan.   We discussed various medication options to help Katherine Smith with her weight loss efforts and we both agreed to continue [place], and we will refill for 1 month.  - Semaglutide-Weight Management 2.4 MG/0.75ML SOAJ; Inject 2.4 mg into the skin once a week.  Dispense: 9 mL; Refill: 0  Behavioral modification strategies: increasing lean protein intake and no skipping meals.  Katherine Smith has agreed to follow-up with our clinic in 2 weeks. She was informed of the importance of frequent follow-up visits to maximize her success with intensive lifestyle modifications for her multiple health conditions.   Objective:   Blood pressure 92/60, pulse 73, temperature 97.6 F (36.4 C), height '5\' 3"'$  (1.6 m), weight 180 lb (81.6 kg), last menstrual period 04/14/2016, SpO2 97 %. Body mass index is 31.89 kg/m.  General: Cooperative, alert, well developed, in no acute distress. HEENT: Conjunctivae and lids unremarkable. Cardiovascular: Regular rhythm.  Lungs: Normal work of breathing. Neurologic: No focal deficits.   Lab Results  Component Value Date   CREATININE 0.93 07/07/2020   BUN 17 07/07/2020  NA 144 07/07/2020   K 4.1 07/07/2020   CL 104 07/07/2020   CO2 25 07/07/2020   Lab Results  Component Value Date   ALT 16 07/07/2020   AST 19 07/07/2020   ALKPHOS 88 07/07/2020   BILITOT 0.5 07/07/2020   Lab Results  Component Value Date   HGBA1C 5.2 07/07/2020   HGBA1C 4.9  02/25/2020   HGBA1C 5.2 08/05/2019   HGBA1C 5.1 11/23/2016   HGBA1C 5.3 08/10/2016   Lab Results  Component Value Date   INSULIN 13.9 07/07/2020   INSULIN 19.3 02/25/2020   INSULIN 16.3 08/05/2019   INSULIN 25.8 (H) 05/08/2019   INSULIN 46.2 (H) 10/02/2018   Lab Results  Component Value Date   TSH 1.340 07/07/2020   Lab Results  Component Value Date   CHOL 254 (H) 07/07/2020   HDL 60 07/07/2020   LDLCALC 174 (H) 07/07/2020   TRIG 115 07/07/2020   Lab Results  Component Value Date   VD25OH 44.0 07/07/2020   VD25OH 40.1 02/25/2020   VD25OH 37.6 08/05/2019   Lab Results  Component Value Date   WBC 5.5 07/07/2020   HGB 13.5 07/07/2020   HCT 41.1 07/07/2020   MCV 85 07/07/2020   PLT 297 07/07/2020   No results found for: IRON, TIBC, FERRITIN  Attestation Statements:   Reviewed by clinician on day of visit: allergies, medications, problem list, medical history, surgical history, family history, social history, and previous encounter notes.   I, Trixie Dredge, am acting as transcriptionist for Dennard Nip, MD.  I have reviewed the above documentation for accuracy and completeness, and I agree with the above. -  Dennard Nip, MD

## 2022-01-05 ENCOUNTER — Other Ambulatory Visit: Payer: Self-pay

## 2022-01-18 ENCOUNTER — Other Ambulatory Visit (INDEPENDENT_AMBULATORY_CARE_PROVIDER_SITE_OTHER): Payer: Self-pay | Admitting: Family Medicine

## 2022-01-18 ENCOUNTER — Ambulatory Visit (INDEPENDENT_AMBULATORY_CARE_PROVIDER_SITE_OTHER): Payer: 59 | Admitting: Family Medicine

## 2022-01-18 DIAGNOSIS — F3289 Other specified depressive episodes: Secondary | ICD-10-CM

## 2022-01-18 DIAGNOSIS — E559 Vitamin D deficiency, unspecified: Secondary | ICD-10-CM

## 2022-01-19 ENCOUNTER — Other Ambulatory Visit (INDEPENDENT_AMBULATORY_CARE_PROVIDER_SITE_OTHER): Payer: Self-pay | Admitting: Nurse Practitioner

## 2022-01-19 ENCOUNTER — Encounter (INDEPENDENT_AMBULATORY_CARE_PROVIDER_SITE_OTHER): Payer: Self-pay | Admitting: Family Medicine

## 2022-01-19 MED ORDER — VITAMIN D (ERGOCALCIFEROL) 1.25 MG (50000 UNIT) PO CAPS
ORAL_CAPSULE | ORAL | 0 refills | Status: DC
  Filled 2022-01-19: qty 4, 28d supply, fill #0

## 2022-01-19 MED ORDER — ESCITALOPRAM OXALATE 20 MG PO TABS
ORAL_TABLET | Freq: Every morning | ORAL | 0 refills | Status: DC
  Filled 2022-01-19: qty 30, 30d supply, fill #0

## 2022-01-19 NOTE — Telephone Encounter (Signed)
Patient is calling back to check on the status of this refill request. She was scheduled to see Dr. Leafy Ro and she will be out of her medication prior to her next OV.

## 2022-01-20 ENCOUNTER — Other Ambulatory Visit: Payer: Self-pay

## 2022-01-25 ENCOUNTER — Other Ambulatory Visit: Payer: Self-pay

## 2022-01-25 DIAGNOSIS — B078 Other viral warts: Secondary | ICD-10-CM | POA: Diagnosis not present

## 2022-01-25 DIAGNOSIS — D225 Melanocytic nevi of trunk: Secondary | ICD-10-CM | POA: Diagnosis not present

## 2022-01-25 DIAGNOSIS — D2262 Melanocytic nevi of left upper limb, including shoulder: Secondary | ICD-10-CM | POA: Diagnosis not present

## 2022-01-25 DIAGNOSIS — L821 Other seborrheic keratosis: Secondary | ICD-10-CM | POA: Diagnosis not present

## 2022-01-25 DIAGNOSIS — L900 Lichen sclerosus et atrophicus: Secondary | ICD-10-CM | POA: Diagnosis not present

## 2022-01-25 DIAGNOSIS — L728 Other follicular cysts of the skin and subcutaneous tissue: Secondary | ICD-10-CM | POA: Diagnosis not present

## 2022-01-25 DIAGNOSIS — Z85828 Personal history of other malignant neoplasm of skin: Secondary | ICD-10-CM | POA: Diagnosis not present

## 2022-01-25 MED ORDER — LEVOTHYROXINE SODIUM 112 MCG PO TABS
ORAL_TABLET | ORAL | 1 refills | Status: DC
  Filled 2022-01-25: qty 90, 90d supply, fill #0
  Filled 2022-05-06: qty 90, 90d supply, fill #1

## 2022-01-25 MED ORDER — FLUCONAZOLE 200 MG PO TABS
ORAL_TABLET | ORAL | 1 refills | Status: DC
  Filled 2022-01-25: qty 4, 28d supply, fill #0
  Filled 2022-07-28: qty 4, 28d supply, fill #1

## 2022-01-25 MED ORDER — CLOBETASOL PROPIONATE 0.05 % EX OINT
TOPICAL_OINTMENT | CUTANEOUS | 2 refills | Status: DC
  Filled 2022-01-25: qty 30, 15d supply, fill #0
  Filled 2022-11-05: qty 30, 15d supply, fill #1

## 2022-01-26 ENCOUNTER — Other Ambulatory Visit: Payer: Self-pay

## 2022-02-07 ENCOUNTER — Other Ambulatory Visit: Payer: Self-pay

## 2022-02-07 MED ORDER — PANTOPRAZOLE SODIUM 40 MG PO TBEC
40.0000 mg | DELAYED_RELEASE_TABLET | Freq: Every day | ORAL | 1 refills | Status: DC
  Filled 2022-02-07: qty 90, 90d supply, fill #0
  Filled 2022-05-06: qty 90, 90d supply, fill #1

## 2022-02-08 ENCOUNTER — Ambulatory Visit (INDEPENDENT_AMBULATORY_CARE_PROVIDER_SITE_OTHER): Payer: 59 | Admitting: Family Medicine

## 2022-03-01 ENCOUNTER — Other Ambulatory Visit: Payer: Self-pay

## 2022-03-01 ENCOUNTER — Ambulatory Visit (INDEPENDENT_AMBULATORY_CARE_PROVIDER_SITE_OTHER): Payer: 59 | Admitting: Family Medicine

## 2022-03-01 ENCOUNTER — Encounter (INDEPENDENT_AMBULATORY_CARE_PROVIDER_SITE_OTHER): Payer: Self-pay | Admitting: Family Medicine

## 2022-03-01 VITALS — BP 104/65 | HR 69 | Temp 97.8°F | Ht 63.0 in | Wt 181.0 lb

## 2022-03-01 DIAGNOSIS — Z6832 Body mass index (BMI) 32.0-32.9, adult: Secondary | ICD-10-CM | POA: Diagnosis not present

## 2022-03-01 DIAGNOSIS — E669 Obesity, unspecified: Secondary | ICD-10-CM | POA: Diagnosis not present

## 2022-03-01 DIAGNOSIS — E559 Vitamin D deficiency, unspecified: Secondary | ICD-10-CM | POA: Diagnosis not present

## 2022-03-01 DIAGNOSIS — F3289 Other specified depressive episodes: Secondary | ICD-10-CM

## 2022-03-01 MED ORDER — ESCITALOPRAM OXALATE 20 MG PO TABS
ORAL_TABLET | Freq: Every morning | ORAL | 0 refills | Status: DC
  Filled 2022-03-01: qty 90, 90d supply, fill #0

## 2022-03-01 MED ORDER — VITAMIN D (ERGOCALCIFEROL) 1.25 MG (50000 UNIT) PO CAPS
ORAL_CAPSULE | ORAL | 0 refills | Status: DC
  Filled 2022-03-01: qty 12, 84d supply, fill #0

## 2022-03-02 ENCOUNTER — Other Ambulatory Visit: Payer: Self-pay

## 2022-03-03 ENCOUNTER — Other Ambulatory Visit: Payer: Self-pay

## 2022-03-07 NOTE — Progress Notes (Signed)
Chief Complaint:   OBESITY Katherine Smith is here to discuss her progress with her obesity treatment plan along with follow-up of her obesity related diagnoses. Katherine Smith is on the Category 2 Plan and states she is following her eating plan approximately 50% of the time. Katherine Smith states she is doing 0 minutes 0 times per week.  Today's visit was #: 30 Starting weight: 240 lbs Starting date: 08/10/2016 Today's weight: 181 lbs Today's date: 03/01/2022 Total lbs lost to date: 59 Total lbs lost since last in-office visit: 0  Interim History: Katherine Smith is doing well with minimizing simple carbohydrates.  She is cooking for 1 now and she is still working on getting into a routine.  Her hunger is controlled.  Subjective:   1. Vitamin D deficiency Katherine Smith is stable on vitamin D, and she has no signs of over replacement.  2. Other depression with emotional eating Katherine Smith is doing well on Lexapro, and her mood is stable.  No side effects were mentioned.  Assessment/Plan:   1. Vitamin D deficiency Katherine Smith will continue prescription vitamin D 50,000 units once weekly, we will refill for 90 days.  - Vitamin D, Ergocalciferol, (DRISDOL) 1.25 MG (50000 UNIT) CAPS capsule; TAKE 1 CAPSULE BY MOUTH EVERY 7 DAYS  Dispense: 12 capsule; Refill: 0  2. Other depression with emotional eating Katherine Smith will continue Lexapro 20 mg every morning, and we will refill for 90 days.  - escitalopram (LEXAPRO) 20 MG tablet; TAKE 1 TABLET BY MOUTH EVERY MORNING  Dispense: 90 tablet; Refill: 0  3. Obesity, Current BMI 32.1 Katherine Smith is currently in the action stage of change. As such, her goal is to continue with weight loss efforts. She has agreed to the Category 2 Plan and keeping a food journal and adhering to recommended goals of 350-550 calories and 35+ grams of protein at supper daily.   We discussed various medication options to help Katherine Smith with her weight loss efforts and we both agreed to continue Wegovy 2.4 mg once  weekly, and we will refill for 90 days.  Behavioral modification strategies: increasing lean protein intake and meal planning and cooking strategies.  Katherine Smith has agreed to follow-up with our clinic in 4 weeks. She was informed of the importance of frequent follow-up visits to maximize her success with intensive lifestyle modifications for her multiple health conditions.   Objective:   Blood pressure 104/65, pulse 69, temperature 97.8 F (36.6 C), height '5\' 3"'$  (1.6 m), weight 181 lb (82.1 kg), last menstrual period 04/14/2016, SpO2 97 %. Body mass index is 32.06 kg/m.  General: Cooperative, alert, well developed, in no acute distress. HEENT: Conjunctivae and lids unremarkable. Cardiovascular: Regular rhythm.  Lungs: Normal work of breathing. Neurologic: No focal deficits.   Lab Results  Component Value Date   CREATININE 0.93 07/07/2020   BUN 17 07/07/2020   NA 144 07/07/2020   K 4.1 07/07/2020   CL 104 07/07/2020   CO2 25 07/07/2020   Lab Results  Component Value Date   ALT 16 07/07/2020   AST 19 07/07/2020   ALKPHOS 88 07/07/2020   BILITOT 0.5 07/07/2020   Lab Results  Component Value Date   HGBA1C 5.2 07/07/2020   HGBA1C 4.9 02/25/2020   HGBA1C 5.2 08/05/2019   HGBA1C 5.1 11/23/2016   HGBA1C 5.3 08/10/2016   Lab Results  Component Value Date   INSULIN 13.9 07/07/2020   INSULIN 19.3 02/25/2020   INSULIN 16.3 08/05/2019   INSULIN 25.8 (H) 05/08/2019   INSULIN 46.2 (  H) 10/02/2018   Lab Results  Component Value Date   TSH 1.340 07/07/2020   Lab Results  Component Value Date   CHOL 254 (H) 07/07/2020   HDL 60 07/07/2020   LDLCALC 174 (H) 07/07/2020   TRIG 115 07/07/2020   Lab Results  Component Value Date   VD25OH 44.0 07/07/2020   VD25OH 40.1 02/25/2020   VD25OH 37.6 08/05/2019   Lab Results  Component Value Date   WBC 5.5 07/07/2020   HGB 13.5 07/07/2020   HCT 41.1 07/07/2020   MCV 85 07/07/2020   PLT 297 07/07/2020   No results found for:  "IRON", "TIBC", "FERRITIN"  Attestation Statements:   Reviewed by clinician on day of visit: allergies, medications, problem list, medical history, surgical history, family history, social history, and previous encounter notes.  Time spent on visit including pre-visit chart review and post-visit care and charting was 40 minutes.   I, Trixie Dredge, am acting as transcriptionist for Dennard Nip, MD.  I have reviewed the above documentation for accuracy and completeness, and I agree with the above. -  Dennard Nip, MD

## 2022-03-08 ENCOUNTER — Other Ambulatory Visit: Payer: Self-pay

## 2022-03-08 MED ORDER — SEMAGLUTIDE-WEIGHT MANAGEMENT 2.4 MG/0.75ML ~~LOC~~ SOAJ
2.4000 mg | SUBCUTANEOUS | 0 refills | Status: DC
  Filled 2022-03-08: qty 9, 84d supply, fill #0

## 2022-03-08 NOTE — Addendum Note (Signed)
Addended by: Ronaldo Miyamoto on: 03/08/2022 08:30 AM   Modules accepted: Orders

## 2022-03-13 DIAGNOSIS — K219 Gastro-esophageal reflux disease without esophagitis: Secondary | ICD-10-CM | POA: Diagnosis not present

## 2022-03-13 DIAGNOSIS — Z131 Encounter for screening for diabetes mellitus: Secondary | ICD-10-CM | POA: Diagnosis not present

## 2022-03-13 DIAGNOSIS — E782 Mixed hyperlipidemia: Secondary | ICD-10-CM | POA: Diagnosis not present

## 2022-03-15 DIAGNOSIS — F5101 Primary insomnia: Secondary | ICD-10-CM | POA: Diagnosis not present

## 2022-03-15 DIAGNOSIS — Z6833 Body mass index (BMI) 33.0-33.9, adult: Secondary | ICD-10-CM | POA: Diagnosis not present

## 2022-03-15 DIAGNOSIS — Z0000 Encounter for general adult medical examination without abnormal findings: Secondary | ICD-10-CM | POA: Diagnosis not present

## 2022-03-15 DIAGNOSIS — E6609 Other obesity due to excess calories: Secondary | ICD-10-CM | POA: Diagnosis not present

## 2022-03-15 DIAGNOSIS — E782 Mixed hyperlipidemia: Secondary | ICD-10-CM | POA: Diagnosis not present

## 2022-03-15 DIAGNOSIS — E039 Hypothyroidism, unspecified: Secondary | ICD-10-CM | POA: Diagnosis not present

## 2022-03-22 ENCOUNTER — Encounter (INDEPENDENT_AMBULATORY_CARE_PROVIDER_SITE_OTHER): Payer: Self-pay

## 2022-03-29 ENCOUNTER — Ambulatory Visit (INDEPENDENT_AMBULATORY_CARE_PROVIDER_SITE_OTHER): Payer: 59 | Admitting: Family Medicine

## 2022-04-12 ENCOUNTER — Ambulatory Visit (INDEPENDENT_AMBULATORY_CARE_PROVIDER_SITE_OTHER): Payer: 59 | Admitting: Family Medicine

## 2022-04-19 ENCOUNTER — Encounter (INDEPENDENT_AMBULATORY_CARE_PROVIDER_SITE_OTHER): Payer: Self-pay | Admitting: Family Medicine

## 2022-04-19 ENCOUNTER — Ambulatory Visit (INDEPENDENT_AMBULATORY_CARE_PROVIDER_SITE_OTHER): Payer: 59 | Admitting: Family Medicine

## 2022-04-19 VITALS — BP 119/75 | HR 71 | Temp 98.0°F | Ht 63.0 in | Wt 185.0 lb

## 2022-04-19 DIAGNOSIS — E669 Obesity, unspecified: Secondary | ICD-10-CM

## 2022-04-19 DIAGNOSIS — Z6832 Body mass index (BMI) 32.0-32.9, adult: Secondary | ICD-10-CM

## 2022-04-19 DIAGNOSIS — R7303 Prediabetes: Secondary | ICD-10-CM | POA: Diagnosis not present

## 2022-04-26 NOTE — Progress Notes (Signed)
Chief Complaint:   OBESITY Katherine Smith is here to discuss her progress with her obesity treatment plan along with follow-up of her obesity related diagnoses. Katherine Smith is on the Category 2 Plan and keeping a food journal and adhering to recommended goals of 350-550 calories and 35+ grams of protein at supper daily and states she is following her eating plan approximately 50% of the time. Katherine Smith states she is doing 0 minutes 0 times per week.  Today's visit was #: 19 Starting weight: 240 lbs Starting date: 08/10/2016 Today's weight: 185 lbs Today's date: 04/19/2022 Total lbs lost to date: 55 Total lbs lost since last in-office visit: 0  Interim History: Katherine Smith has had multiple family members who are severely sick. She hasn't been able to follow her plan as closely, but she is getting ready to be more structured. She is stable on Wegovy with no side effects noted.   Subjective:   1. Prediabetes Katherine Smith continues to work on her diet, exercise, and weight loss. She is tolerating her GLP-1 well with no side effects. Her recent A1c was 5.2, but her fasting glucose was elevated at 108.  Assessment/Plan:   1. Prediabetes Katherine Smith will continue to work on decreasing simple carbohydrates, and we will continue to follow.  2. Obesity, Current BMI 32.9 Katherine Smith is currently in the action stage of change. As such, her goal is to continue with weight loss efforts. She has agreed to the Category 2 Plan and keeping a food journal and adhering to recommended goals of 400-550 calories and 40 grams of protein at supper daily.   Behavioral modification strategies: increasing lean protein intake, meal planning and cooking strategies, and holiday eating strategies .  Katherine Smith has agreed to follow-up with our clinic in 2 weeks. She was informed of the importance of frequent follow-up visits to maximize her success with intensive lifestyle modifications for her multiple health conditions.   Objective:   Blood  pressure 119/75, pulse 71, temperature 98 F (36.7 C), height '5\' 3"'$  (1.6 m), weight 185 lb (83.9 kg), last menstrual period 04/14/2016, SpO2 96 %. Body mass index is 32.77 kg/m.  General: Cooperative, alert, well developed, in no acute distress. HEENT: Conjunctivae and lids unremarkable. Cardiovascular: Regular rhythm.  Lungs: Normal work of breathing. Neurologic: No focal deficits.   Lab Results  Component Value Date   CREATININE 0.93 07/07/2020   BUN 17 07/07/2020   NA 144 07/07/2020   K 4.1 07/07/2020   CL 104 07/07/2020   CO2 25 07/07/2020   Lab Results  Component Value Date   ALT 16 07/07/2020   AST 19 07/07/2020   ALKPHOS 88 07/07/2020   BILITOT 0.5 07/07/2020   Lab Results  Component Value Date   HGBA1C 5.2 07/07/2020   HGBA1C 4.9 02/25/2020   HGBA1C 5.2 08/05/2019   HGBA1C 5.1 11/23/2016   HGBA1C 5.3 08/10/2016   Lab Results  Component Value Date   INSULIN 13.9 07/07/2020   INSULIN 19.3 02/25/2020   INSULIN 16.3 08/05/2019   INSULIN 25.8 (H) 05/08/2019   INSULIN 46.2 (H) 10/02/2018   Lab Results  Component Value Date   TSH 1.340 07/07/2020   Lab Results  Component Value Date   CHOL 254 (H) 07/07/2020   HDL 60 07/07/2020   LDLCALC 174 (H) 07/07/2020   TRIG 115 07/07/2020   Lab Results  Component Value Date   VD25OH 44.0 07/07/2020   VD25OH 40.1 02/25/2020   VD25OH 37.6 08/05/2019   Lab Results  Component  Value Date   WBC 5.5 07/07/2020   HGB 13.5 07/07/2020   HCT 41.1 07/07/2020   MCV 85 07/07/2020   PLT 297 07/07/2020   No results found for: "IRON", "TIBC", "FERRITIN"  Attestation Statements:   Reviewed by clinician on day of visit: allergies, medications, problem list, medical history, surgical history, family history, social history, and previous encounter notes.  Time spent on visit including pre-visit chart review and post-visit care and charting was 30 minutes.   I, Trixie Dredge, am acting as transcriptionist for Dennard Nip,  MD.  I have reviewed the above documentation for accuracy and completeness, and I agree with the above. -  Dennard Nip, MD

## 2022-05-03 ENCOUNTER — Encounter (INDEPENDENT_AMBULATORY_CARE_PROVIDER_SITE_OTHER): Payer: Self-pay | Admitting: Family Medicine

## 2022-05-03 ENCOUNTER — Ambulatory Visit (INDEPENDENT_AMBULATORY_CARE_PROVIDER_SITE_OTHER): Payer: 59 | Admitting: Family Medicine

## 2022-05-03 VITALS — BP 121/77 | HR 68 | Temp 97.9°F | Ht 63.0 in | Wt 183.0 lb

## 2022-05-03 DIAGNOSIS — Z6832 Body mass index (BMI) 32.0-32.9, adult: Secondary | ICD-10-CM

## 2022-05-03 DIAGNOSIS — E669 Obesity, unspecified: Secondary | ICD-10-CM | POA: Diagnosis not present

## 2022-05-03 DIAGNOSIS — R7303 Prediabetes: Secondary | ICD-10-CM | POA: Diagnosis not present

## 2022-05-06 ENCOUNTER — Other Ambulatory Visit: Payer: Self-pay

## 2022-05-08 ENCOUNTER — Other Ambulatory Visit: Payer: Self-pay

## 2022-05-08 MED ORDER — EZETIMIBE 10 MG PO TABS
10.0000 mg | ORAL_TABLET | Freq: Every day | ORAL | 1 refills | Status: DC
  Filled 2022-05-08: qty 90, 90d supply, fill #0
  Filled 2022-07-28: qty 90, 90d supply, fill #1

## 2022-05-09 NOTE — Progress Notes (Signed)
Chief Complaint:   OBESITY Katherine Smith is here to discuss her progress with her obesity treatment plan along with follow-up of her obesity related diagnoses. Katherine Smith is on the Category 2 Plan and keeping a food journal and adhering to recommended goals of 400-550 calories and 40 grams of protein at supper daily and states she is following her eating plan approximately 80% of the time. Katherine Smith states she is walking for 30 minutes 5 times per week.  Today's visit was #: 73 Starting weight: 240 lbs Starting date: 08/10/2016 Today's weight: 183 lbs Today's date: 05/03/2022 Total lbs lost to date: 57 Total lbs lost since last in-office visit: 2  Interim History: Katherine Smith has done well with meal planning and exercise. She is doing better with meeting her protein goals an her hunger is controlled.   Subjective:   1. Prediabetes Katherine Smith is doing better with her diet and weight loss. Her hunger is better controlled. She has no signs of hypoglycemia.   Assessment/Plan:   1. Prediabetes Katherine Smith will continue with her diet, exercise, and GLP-1, and we will continue to manage her medications and refill as needed.   2. Obesity, Current BMI 32.6 Katherine Smith is currently in the action stage of change. As such, her goal is to continue with weight loss efforts. She has agreed to the Category 2 Plan and keeping a food journal and adhering to recommended goals of 400-550 calories and 40 grams of protein at supper daily.   Exercise goals: As is.   Behavioral modification strategies: increasing lean protein intake.  Katherine Smith has agreed to follow-up with our clinic in 3 weeks. She was informed of the importance of frequent follow-up visits to maximize her success with intensive lifestyle modifications for her multiple health conditions.   Objective:   Blood pressure 121/77, pulse 68, temperature 97.9 F (36.6 C), height '5\' 3"'$  (1.6 m), weight 183 lb (83 kg), last menstrual period 04/14/2016, SpO2 99 %. Body mass  index is 32.42 kg/m.  General: Cooperative, alert, well developed, in no acute distress. HEENT: Conjunctivae and lids unremarkable. Cardiovascular: Regular rhythm.  Lungs: Normal work of breathing. Neurologic: No focal deficits.   Lab Results  Component Value Date   CREATININE 0.93 07/07/2020   BUN 17 07/07/2020   NA 144 07/07/2020   K 4.1 07/07/2020   CL 104 07/07/2020   CO2 25 07/07/2020   Lab Results  Component Value Date   ALT 16 07/07/2020   AST 19 07/07/2020   ALKPHOS 88 07/07/2020   BILITOT 0.5 07/07/2020   Lab Results  Component Value Date   HGBA1C 5.2 07/07/2020   HGBA1C 4.9 02/25/2020   HGBA1C 5.2 08/05/2019   HGBA1C 5.1 11/23/2016   HGBA1C 5.3 08/10/2016   Lab Results  Component Value Date   INSULIN 13.9 07/07/2020   INSULIN 19.3 02/25/2020   INSULIN 16.3 08/05/2019   INSULIN 25.8 (H) 05/08/2019   INSULIN 46.2 (H) 10/02/2018   Lab Results  Component Value Date   TSH 1.340 07/07/2020   Lab Results  Component Value Date   CHOL 254 (H) 07/07/2020   HDL 60 07/07/2020   LDLCALC 174 (H) 07/07/2020   TRIG 115 07/07/2020   Lab Results  Component Value Date   VD25OH 44.0 07/07/2020   VD25OH 40.1 02/25/2020   VD25OH 37.6 08/05/2019   Lab Results  Component Value Date   WBC 5.5 07/07/2020   HGB 13.5 07/07/2020   HCT 41.1 07/07/2020   MCV 85 07/07/2020  PLT 297 07/07/2020   No results found for: "IRON", "TIBC", "FERRITIN"  Attestation Statements:   Reviewed by clinician on day of visit: allergies, medications, problem list, medical history, surgical history, family history, social history, and previous encounter notes.  Time spent on visit including pre-visit chart review and post-visit care and charting was 30 minutes.   I, Trixie Dredge, am acting as transcriptionist for Dennard Nip, MD.  I have reviewed the above documentation for accuracy and completeness, and I agree with the above. -  Dennard Nip, MD

## 2022-05-24 ENCOUNTER — Ambulatory Visit (INDEPENDENT_AMBULATORY_CARE_PROVIDER_SITE_OTHER): Payer: 59 | Admitting: Family Medicine

## 2022-05-24 ENCOUNTER — Encounter (INDEPENDENT_AMBULATORY_CARE_PROVIDER_SITE_OTHER): Payer: Self-pay | Admitting: Family Medicine

## 2022-05-24 ENCOUNTER — Other Ambulatory Visit: Payer: Self-pay

## 2022-05-24 DIAGNOSIS — E559 Vitamin D deficiency, unspecified: Secondary | ICD-10-CM | POA: Diagnosis not present

## 2022-05-24 DIAGNOSIS — E669 Obesity, unspecified: Secondary | ICD-10-CM

## 2022-05-24 DIAGNOSIS — Z6832 Body mass index (BMI) 32.0-32.9, adult: Secondary | ICD-10-CM

## 2022-05-24 DIAGNOSIS — F3289 Other specified depressive episodes: Secondary | ICD-10-CM | POA: Diagnosis not present

## 2022-05-24 MED ORDER — VITAMIN D (ERGOCALCIFEROL) 1.25 MG (50000 UNIT) PO CAPS
ORAL_CAPSULE | ORAL | 0 refills | Status: DC
  Filled 2022-05-24: qty 12, 84d supply, fill #0

## 2022-05-24 MED ORDER — ESCITALOPRAM OXALATE 20 MG PO TABS
ORAL_TABLET | Freq: Every morning | ORAL | 0 refills | Status: DC
  Filled 2022-05-24: qty 90, 90d supply, fill #0

## 2022-05-24 MED ORDER — SEMAGLUTIDE-WEIGHT MANAGEMENT 2.4 MG/0.75ML ~~LOC~~ SOAJ
2.4000 mg | SUBCUTANEOUS | 0 refills | Status: DC
  Filled 2022-05-24: qty 9, 84d supply, fill #0

## 2022-05-24 NOTE — Progress Notes (Addendum)
History of Present Illness The patient reports having recently returned from a vacation in [place], during which they admit their eating habits were not ideal due to frequent social outings. Despite this, the patient has managed to maintain their weight and has successfully lost 57 pounds since their first appointment. They have been practicing portion control by eating only half of their meals when dining out. The patient has been on 2.4 mg of Wegovy for a while and has not experienced any issues with blood sugar or feelings of sickness from the medication.  The patient's last hemoglobin A1c was 5.2, indicating good blood sugar control. They do not currently check their blood sugars at home. Their blood pressure is also within a healthy range. The patient is taking vitamin D supplements, with their last level at 44, approaching the goal of 50 to 60.   They are also on Lexapro for anxiety, which they report has helped them handle stress and anxiety better, allowing them to manage problems without becoming overwhelmed or obsessed.  The patient acknowledges that they still have some work to do in terms of reducing their fat percentage, which is currently at 39%, with a goal of 35%. Their visceral fat rating is 11, which is already at the goal for postmenopausal women. The patient's muscle mass is higher than their fat percentage, which is a positive aspect of their health. The patient is advised to focus on losing an additional 10-15 pounds to reach their fat percentage goal while maintaining their muscle mass. They are encouraged to continue with category 2 meal planning and schedule follow-up appointments to monitor their progress.  Blood pressure 133/73, pulse 82, temperature 98.1 F (36.7 C), height '5\' 3"'$  (1.6 m), weight 183 lb 12.8 oz (83.4 kg), last menstrual period 04/14/2016, SpO2 99 %. Body mass index is 32.56 kg/m.   Review of Systems Constitutional: - Denies feeling sick from [place] - Denies  dropping low blood sugar  Assessment & Plan Prediabetes: Excellent glycemic control with a recent HbA1c of 5.2. -Continue current regimen.  Vitamin D Deficiency: Recent level was 44, close to goal of 50-60. -Refill Vitamin D prescription.  Emotional Eating Behaviors: Patient reports improved coping mechanisms and reduced anxiety on Lexapro. -Refill Lexapro  Obesity: Patient's body fat percentage is 39%, goal is 35%. Visceral fat rating is at goal (11). Muscle mass is good. -Plan for additional weight loss of 10-15 pounds to reduce body fat percentage, while preserving muscle mass. -Continue with category 2 meal planning and refill Wegovy  Follow-up in 3-4 weeks.

## 2022-06-06 ENCOUNTER — Other Ambulatory Visit: Payer: Self-pay

## 2022-06-21 ENCOUNTER — Ambulatory Visit (INDEPENDENT_AMBULATORY_CARE_PROVIDER_SITE_OTHER): Payer: 59 | Admitting: Family Medicine

## 2022-06-21 ENCOUNTER — Encounter (INDEPENDENT_AMBULATORY_CARE_PROVIDER_SITE_OTHER): Payer: Self-pay | Admitting: Family Medicine

## 2022-06-21 ENCOUNTER — Other Ambulatory Visit: Payer: Self-pay

## 2022-06-21 VITALS — BP 121/71 | HR 69 | Temp 97.9°F | Ht 63.0 in | Wt 184.0 lb

## 2022-06-21 DIAGNOSIS — Z6832 Body mass index (BMI) 32.0-32.9, adult: Secondary | ICD-10-CM

## 2022-06-21 DIAGNOSIS — F3289 Other specified depressive episodes: Secondary | ICD-10-CM

## 2022-06-21 DIAGNOSIS — E669 Obesity, unspecified: Secondary | ICD-10-CM

## 2022-06-21 DIAGNOSIS — E559 Vitamin D deficiency, unspecified: Secondary | ICD-10-CM | POA: Diagnosis not present

## 2022-06-21 MED ORDER — VITAMIN D (ERGOCALCIFEROL) 1.25 MG (50000 UNIT) PO CAPS
ORAL_CAPSULE | ORAL | 0 refills | Status: DC
  Filled 2022-06-21: qty 12, fill #0
  Filled 2022-07-28: qty 12, 84d supply, fill #0

## 2022-06-21 MED ORDER — ESCITALOPRAM OXALATE 20 MG PO TABS
ORAL_TABLET | Freq: Every morning | ORAL | 0 refills | Status: DC
  Filled 2022-06-21: qty 90, fill #0
  Filled 2022-07-28: qty 90, 90d supply, fill #0
  Filled ????-??-??: fill #0

## 2022-06-21 MED ORDER — SEMAGLUTIDE-WEIGHT MANAGEMENT 2.4 MG/0.75ML ~~LOC~~ SOAJ
2.4000 mg | SUBCUTANEOUS | 0 refills | Status: DC
Start: 2022-06-21 — End: 2022-08-15
  Filled 2022-06-21 – 2022-07-19 (×2): qty 9, 84d supply, fill #0
  Filled 2022-08-12: qty 3, 28d supply, fill #0

## 2022-06-22 ENCOUNTER — Other Ambulatory Visit: Payer: Self-pay

## 2022-06-23 ENCOUNTER — Other Ambulatory Visit: Payer: Self-pay

## 2022-06-23 MED ORDER — COVID-19 MRNA VAC-TRIS(PFIZER) 30 MCG/0.3ML IM SUSY
PREFILLED_SYRINGE | INTRAMUSCULAR | 0 refills | Status: DC
  Filled 2022-06-23: qty 0.3, 1d supply, fill #0

## 2022-06-26 ENCOUNTER — Other Ambulatory Visit: Payer: Self-pay

## 2022-06-26 MED ORDER — NA SULFATE-K SULFATE-MG SULF 17.5-3.13-1.6 GM/177ML PO SOLN
ORAL | 0 refills | Status: DC
  Filled 2022-06-26 – 2022-11-05 (×2): qty 354, 1d supply, fill #0

## 2022-06-28 ENCOUNTER — Other Ambulatory Visit: Payer: Self-pay

## 2022-06-28 MED ORDER — CLOBETASOL PROPIONATE 0.05 % EX OINT
TOPICAL_OINTMENT | CUTANEOUS | 2 refills | Status: DC
  Filled 2022-06-28: qty 30, 30d supply, fill #0

## 2022-07-04 NOTE — Progress Notes (Signed)
Chief Complaint:   OBESITY Katherine Smith is here to discuss her progress with her obesity treatment plan along with follow-up of her obesity related diagnoses. Katherine Smith is on the Category 2 Plan and states she is following her eating plan approximately 50% of the time. Katherine Smith states she is doing 0 minutes 0 times per week.  Today's visit was #: 42 Starting weight: 240 lbs Starting date: 08/10/2016 Today's weight: 184 lbs Today's date: 06/21/2022 Total lbs lost to date: 56 Total lbs lost since last in-office visit: 0  Interim History: Katherine Smith continues to work on her diet and weight loss. No side effects were noted with [facility]. Her hunger is controlled and she is mindful of her food choices. She notes increased temptations at work.   Subjective:   1. Vitamin D deficiency Katherine Smith is stable on Vitamin D, and she denies nausea, vomiting, or muscle weakness.   2. Other depression with emotional eating Katherine Smith is on Lexapro and she is doing well overall. She is sleeping a bit better and she is doing well with minimizing emotional eating behaviors.   Assessment/Plan:   1. Vitamin D deficiency We will refill prescription Vitamin D for 90 days. [place] will follow-up for routine testing of Vitamin D, at least 2-3 times per year to avoid over-replacement.  - Vitamin D, Ergocalciferol, (DRISDOL) 1.25 MG (50000 UNIT) CAPS capsule; TAKE 1 CAPSULE BY MOUTH EVERY 7 DAYS  Dispense: 12 capsule; Refill: 0  2. Other depression with emotional eating We will refill Lexapro 20 mg once daily, and we will refill for 90 days.   - escitalopram (LEXAPRO) 20 MG tablet; TAKE 1 TABLET BY MOUTH EVERY MORNING  Dispense: 90 tablet; Refill: 0  3. Obesity, Current BMI 32.7 Katherine Smith is currently in the action stage of change. As such, her goal is to continue with weight loss efforts. She has agreed to the Category 2 Plan.   Katherine Smith will continue Wegovy 2.4 mg once weekly, and we will refill for 1 month.   -  Semaglutide-Weight Management 2.4 MG/0.75ML SOAJ; Inject 2.4 mg into the skin once a week.  Dispense: 9 mL; Refill: 0  Exercise goals: Strengthening 3 times per week.   Behavioral modification strategies: increasing lean protein intake, dealing with family or coworker sabotage, and holiday eating strategies .  Katherine Smith has agreed to follow-up with our clinic in 4 weeks. She was informed of the importance of frequent follow-up visits to maximize her success with intensive lifestyle modifications for her multiple health conditions.   Objective:   Blood pressure 121/71, pulse 69, temperature 97.9 F (36.6 C), height '5\' 3"'$  (1.6 m), weight 184 lb (83.5 kg), last menstrual period 04/14/2016, SpO2 98 %. Body mass index is 32.59 kg/m.  General: Cooperative, alert, well developed, in no acute distress. HEENT: Conjunctivae and lids unremarkable. Cardiovascular: Regular rhythm.  Lungs: Normal work of breathing. Neurologic: No focal deficits.   Lab Results  Component Value Date   CREATININE 0.93 07/07/2020   BUN 17 07/07/2020   NA 144 07/07/2020   K 4.1 07/07/2020   CL 104 07/07/2020   CO2 25 07/07/2020   Lab Results  Component Value Date   ALT 16 07/07/2020   AST 19 07/07/2020   ALKPHOS 88 07/07/2020   BILITOT 0.5 07/07/2020   Lab Results  Component Value Date   HGBA1C 5.2 07/07/2020   HGBA1C 4.9 02/25/2020   HGBA1C 5.2 08/05/2019   HGBA1C 5.1 11/23/2016   HGBA1C 5.3 08/10/2016   Lab Results  Component Value Date   INSULIN 13.9 07/07/2020   INSULIN 19.3 02/25/2020   INSULIN 16.3 08/05/2019   INSULIN 25.8 (H) 05/08/2019   INSULIN 46.2 (H) 10/02/2018   Lab Results  Component Value Date   TSH 1.340 07/07/2020   Lab Results  Component Value Date   CHOL 254 (H) 07/07/2020   HDL 60 07/07/2020   LDLCALC 174 (H) 07/07/2020   TRIG 115 07/07/2020   Lab Results  Component Value Date   VD25OH 44.0 07/07/2020   VD25OH 40.1 02/25/2020   VD25OH 37.6 08/05/2019   Lab Results   Component Value Date   WBC 5.5 07/07/2020   HGB 13.5 07/07/2020   HCT 41.1 07/07/2020   MCV 85 07/07/2020   PLT 297 07/07/2020   No results found for: "IRON", "TIBC", "FERRITIN"  Attestation Statements:   Reviewed by clinician on day of visit: allergies, medications, problem list, medical history, surgical history, family history, social history, and previous encounter notes.   I, Trixie Dredge, am acting as transcriptionist for Dennard Nip, MD.  I have reviewed the above documentation for accuracy and completeness, and I agree with the above. -  Dennard Nip, MD

## 2022-07-12 ENCOUNTER — Ambulatory Visit (INDEPENDENT_AMBULATORY_CARE_PROVIDER_SITE_OTHER): Payer: 59 | Admitting: Family Medicine

## 2022-07-19 ENCOUNTER — Other Ambulatory Visit: Payer: Self-pay

## 2022-07-28 ENCOUNTER — Other Ambulatory Visit: Payer: Self-pay

## 2022-07-30 ENCOUNTER — Other Ambulatory Visit: Payer: Self-pay

## 2022-07-31 ENCOUNTER — Other Ambulatory Visit: Payer: Self-pay

## 2022-07-31 MED ORDER — LEVOTHYROXINE SODIUM 112 MCG PO TABS
112.0000 ug | ORAL_TABLET | Freq: Every day | ORAL | 1 refills | Status: DC
  Filled 2022-07-31: qty 90, 90d supply, fill #0
  Filled 2022-11-05: qty 90, 90d supply, fill #1

## 2022-07-31 MED ORDER — PANTOPRAZOLE SODIUM 40 MG PO TBEC
40.0000 mg | DELAYED_RELEASE_TABLET | Freq: Every day | ORAL | 1 refills | Status: DC
  Filled 2022-07-31: qty 90, 90d supply, fill #0
  Filled 2023-01-09: qty 90, 90d supply, fill #1

## 2022-08-01 ENCOUNTER — Other Ambulatory Visit: Payer: Self-pay

## 2022-08-04 ENCOUNTER — Other Ambulatory Visit: Payer: Self-pay

## 2022-08-08 ENCOUNTER — Other Ambulatory Visit: Payer: Self-pay

## 2022-08-12 ENCOUNTER — Other Ambulatory Visit: Payer: Self-pay

## 2022-08-13 ENCOUNTER — Other Ambulatory Visit: Payer: Self-pay

## 2022-08-15 ENCOUNTER — Ambulatory Visit (INDEPENDENT_AMBULATORY_CARE_PROVIDER_SITE_OTHER): Payer: Commercial Managed Care - PPO | Admitting: Family Medicine

## 2022-08-15 ENCOUNTER — Encounter (INDEPENDENT_AMBULATORY_CARE_PROVIDER_SITE_OTHER): Payer: Self-pay | Admitting: Family Medicine

## 2022-08-15 ENCOUNTER — Other Ambulatory Visit: Payer: Self-pay

## 2022-08-15 VITALS — BP 118/74 | HR 82 | Temp 97.8°F | Ht 63.0 in | Wt 190.0 lb

## 2022-08-15 DIAGNOSIS — Z6833 Body mass index (BMI) 33.0-33.9, adult: Secondary | ICD-10-CM

## 2022-08-15 DIAGNOSIS — R7303 Prediabetes: Secondary | ICD-10-CM

## 2022-08-15 DIAGNOSIS — E669 Obesity, unspecified: Secondary | ICD-10-CM | POA: Diagnosis not present

## 2022-08-15 MED ORDER — ZEPBOUND 12.5 MG/0.5ML ~~LOC~~ SOAJ
12.5000 mg | SUBCUTANEOUS | 0 refills | Status: DC
  Filled 2022-08-15 – 2022-08-17 (×3): qty 2, 28d supply, fill #0

## 2022-08-16 ENCOUNTER — Telehealth (INDEPENDENT_AMBULATORY_CARE_PROVIDER_SITE_OTHER): Payer: Self-pay | Admitting: Family Medicine

## 2022-08-16 ENCOUNTER — Other Ambulatory Visit: Payer: Self-pay

## 2022-08-16 NOTE — Telephone Encounter (Signed)
The request has been approved. The authorization is effective from 08/16/2022 to 02/12/2023, as long as the member is enrolled in their current health plan. The request was approved as submitted. This request has been approved for 41m per 28 days.A second prior authorization (8306) has been entered for Zepbound 2.'5mg'$ /0.598m this request is effective from 08/16/2022 and is valid until 02/12/2023 and is limited to 75m47mer 28 days.A third prior authorization (8307) has been entered for Zepbound '5mg'$ /0.5mL59mhis request is effective from 08/16/2022 and is valid until 02/12/2023 and is limited to 75mL 44m 28 days.A fourth prior authorization (8308) has been entered for Zepbound 7.'5mg'$ /0.5mL, 4ms request is effective from 08/16/2022 and is valid until 02/12/2023 and is limited to 75mL pe5m8 days.A fifth prior authorization (8309) has been entered for Zepbound '10mg'$ /0.5mL, th40mrequest is effective from 08/16/2022 and is valid until 02/12/2023 and is limited to 75mL per 77mdays.A sixth prior authorization (8310) has been entered for Zepbound '15mg'$ /0.5mL, this10mquest is effective from 08/16/2022 and is valid until 02/12/2023 and is limited to 75mL per 2829mys. A written notification letter will follow with additional details. Authorization Expiration Date: 02/11/2023

## 2022-08-16 NOTE — Telephone Encounter (Signed)
PA for Zepbound has been submitted via CoverMymeds. Awaiting a decision  (Key: [id number])  Your information has been sent to [place].

## 2022-08-17 ENCOUNTER — Other Ambulatory Visit: Payer: Self-pay

## 2022-08-18 ENCOUNTER — Other Ambulatory Visit: Payer: Self-pay

## 2022-08-28 NOTE — Progress Notes (Unsigned)
Chief Complaint:   OBESITY Katherine Smith is here to discuss her progress with her obesity treatment plan along with follow-up of her obesity related diagnoses. Katherine Smith is on the Category 2 Plan and states she is following her eating plan approximately 25% of the time. Katherine Smith states she is walking for 20-30 minutes 5 times per week.  Today's visit was #: 26 Starting weight: 240 lbs Starting date: 08/10/2016 Today's weight: 190 lbs Today's date: 08/15/2022 Total lbs lost to date: 50 Total lbs lost since last in-office visit: 0  Interim History: Katherine Smith has gained a bit of weight over the holidays, but she is ready to get back on track.  She notes snacking has increased and protein has decreased.  She has been on [place] in the past and she would like to start Zepbound, as this would help decrease more cravings.  Subjective:   1. Prediabetes Katherine Smith has been working on her diet, but she is struggling with increased polyphagia.  She has been on GLP-once for a while but she is open to trying a new GLP-1.  Assessment/Plan:   1. Prediabetes Katherine Smith will change from [facility] to Zepbound for weight loss, but also for improved glucose control.  We will follow-up at her next visit in 4 weeks.  2. Obesity, Current BMI 33.7 Katherine Smith agreed to start Zepbound 12.5 mg once weekly with no refills, and discontinue XIPJAS.  - tirzepatide (ZEPBOUND) 12.5 MG/0.5ML Pen; Inject 12.5 mg into the skin once a week.  Dispense: 2 mL; Refill: 0  Katherine Smith is currently in the action stage of change. As such, her goal is to continue with weight loss efforts. She has agreed to the Category 2 Plan.   Exercise goals: As is.   Behavioral modification strategies: increasing lean protein intake and no skipping meals.  Katherine Smith has agreed to follow-up with our clinic in 3 to 4 weeks. She was informed of the importance of frequent follow-up visits to maximize her success with intensive lifestyle modifications for her  multiple health conditions.   Objective:   Blood pressure 118/74, pulse 82, temperature 97.8 F (36.6 C), height '5\' 3"'$  (1.6 m), weight 190 lb (86.2 kg), last menstrual period 04/14/2016, SpO2 97 %. Body mass index is 33.66 kg/m.  General: Cooperative, alert, well developed, in no acute distress. HEENT: Conjunctivae and lids unremarkable. Cardiovascular: Regular rhythm.  Lungs: Normal work of breathing. Neurologic: No focal deficits.   Lab Results  Component Value Date   CREATININE 0.93 07/07/2020   BUN 17 07/07/2020   NA 144 07/07/2020   K 4.1 07/07/2020   CL 104 07/07/2020   CO2 25 07/07/2020   Lab Results  Component Value Date   ALT 16 07/07/2020   AST 19 07/07/2020   ALKPHOS 88 07/07/2020   BILITOT 0.5 07/07/2020   Lab Results  Component Value Date   HGBA1C 5.2 07/07/2020   HGBA1C 4.9 02/25/2020   HGBA1C 5.2 08/05/2019   HGBA1C 5.1 11/23/2016   HGBA1C 5.3 08/10/2016   Lab Results  Component Value Date   INSULIN 13.9 07/07/2020   INSULIN 19.3 02/25/2020   INSULIN 16.3 08/05/2019   INSULIN 25.8 (H) 05/08/2019   INSULIN 46.2 (H) 10/02/2018   Lab Results  Component Value Date   TSH 1.340 07/07/2020   Lab Results  Component Value Date   CHOL 254 (H) 07/07/2020   HDL 60 07/07/2020   LDLCALC 174 (H) 07/07/2020   TRIG 115 07/07/2020   Lab Results  Component  Value Date   VD25OH 44.0 07/07/2020   VD25OH 40.1 02/25/2020   VD25OH 37.6 08/05/2019   Lab Results  Component Value Date   WBC 5.5 07/07/2020   HGB 13.5 07/07/2020   HCT 41.1 07/07/2020   MCV 85 07/07/2020   PLT 297 07/07/2020   No results found for: "IRON", "TIBC", "FERRITIN"  Attestation Statements:   Reviewed by clinician on day of visit: allergies, medications, problem list, medical history, surgical history, family history, social history, and previous encounter notes.  Time spent on visit including pre-visit chart review and post-visit care and charting was 30 minutes.   I, Trixie Dredge, am acting as transcriptionist for Dennard Nip, MD.  I have reviewed the above documentation for accuracy and completeness, and I agree with the above. -  Dennard Nip, MD

## 2022-08-29 ENCOUNTER — Other Ambulatory Visit: Payer: Self-pay

## 2022-08-29 ENCOUNTER — Telehealth: Payer: 59 | Admitting: Nurse Practitioner

## 2022-08-29 DIAGNOSIS — J0140 Acute pansinusitis, unspecified: Secondary | ICD-10-CM | POA: Diagnosis not present

## 2022-08-29 DIAGNOSIS — E039 Hypothyroidism, unspecified: Secondary | ICD-10-CM | POA: Insufficient documentation

## 2022-08-29 DIAGNOSIS — K219 Gastro-esophageal reflux disease without esophagitis: Secondary | ICD-10-CM | POA: Insufficient documentation

## 2022-08-29 DIAGNOSIS — L900 Lichen sclerosus et atrophicus: Secondary | ICD-10-CM | POA: Insufficient documentation

## 2022-08-29 MED ORDER — AMOXICILLIN-POT CLAVULANATE 875-125 MG PO TABS
1.0000 | ORAL_TABLET | Freq: Two times a day (BID) | ORAL | 0 refills | Status: AC
Start: 2022-08-29 — End: 2022-09-05
  Filled 2022-08-29: qty 14, 7d supply, fill #0

## 2022-08-29 NOTE — Progress Notes (Signed)
Virtual Visit Consent   Katherine Smith, you are scheduled for a virtual visit with a [facility] provider today. Just as with appointments in the office, your consent must be obtained to participate. Your consent will be active for this visit and any virtual visit you may have with one of our providers in the next 365 days. If you have a MyChart account, a copy of this consent can be sent to you electronically.  As this is a virtual visit, video technology does not allow for your provider to perform a traditional examination. This may limit your provider's ability to fully assess your condition. If your provider identifies any concerns that need to be evaluated in person or the need to arrange testing (such as labs, EKG, etc.), we will make arrangements to do so. Although advances in technology are sophisticated, we cannot ensure that it will always work on either your end or our end. If the connection with a video visit is poor, the visit may have to be switched to a telephone visit. With either a video or telephone visit, we are not always able to ensure that we have a secure connection.  By engaging in this virtual visit, you consent to the provision of healthcare and authorize for your insurance to be billed (if applicable) for the services provided during this visit. Depending on your insurance coverage, you may receive a charge related to this service.  I need to obtain your verbal consent now. Are you willing to proceed with your visit today? TALISSA APPLE has provided verbal consent on 08/29/2022 for a virtual visit (video or telephone). Apolonio Schneiders, FNP  Date: 08/29/2022 4:56 PM  Virtual Visit via Video Note   I, Apolonio Schneiders, connected with  Katherine Smith  ([id number], 1961/03/12) on 08/29/22 at  5:00 PM EST by a video-enabled telemedicine application and verified that I am speaking with the correct person using two identifiers.  Location: Patient: Virtual Visit Location Patient:  Home Provider: Virtual Visit Location Provider: Home Office   I discussed the limitations of evaluation and management by telemedicine and the availability of in person appointments. The patient expressed understanding and agreed to proceed.    History of Present Illness: Katherine Smith is a 62 y.o. who identifies as a female who was assigned female at birth, and is being seen today for sinus symptoms.  She has been congested for the past week with cold symptoms, runny nose, sore throat, and pressure in her ears  She has been using Mucinex D and sudafed  She also takes a  daily antihistamine  She has taken two home COVID tests that were negative   She has been taking ibuprofen and also Excedrin migraine without relief  Her mucous has become darker and green  She does have PND but not a productive cough.   She does not get sinus infections often  Problems:  Patient Active Problem List   Diagnosis Date Noted   Depression 01/08/2020   Class 3 severe obesity with serious comorbidity and body mass index (BMI) of 40.0 to 44.9 in adult ([place]) 02/07/2017   Elevated liver function tests 11/23/2016   B12 nutritional deficiency 11/23/2016   Essential hypertension 10/25/2016   Prediabetes 10/25/2016   Obesity (BMI 30-39.9) 09/26/2016   Hashimoto's thyroiditis 09/07/2016   Sleep apnea 09/07/2016   Vitamin D deficiency 08/24/2016   Hyperlipidemia 08/24/2016    Allergies:  Allergies  Allergen Reactions   Atorvastatin     Other  reaction(s): Muscle Pain   Dexfenfluramine     Other reaction(s): Unknown   Medications:  Current Outpatient Medications:    clobetasol ointment (TEMOVATE) 0.05 %, Apply to affected areas twice daily for flares and taper to three times weekly for maintenance, Disp: 30 g, Rfl: 2   clobetasol ointment (TEMOVATE) 0.05 %, Apply to affected areas twice daily for flares and taper to three times weekly for maintenance, Disp: 30 g, Rfl: 2   COVID-19 mRNA vaccine  2023-2024 (COMIRNATY) syringe, Inject into the muscle., Disp: 0.3 mL, Rfl: 0   escitalopram (LEXAPRO) 20 MG tablet, TAKE 1 TABLET BY MOUTH EVERY MORNING, Disp: 90 tablet, Rfl: 0   ezetimibe (ZETIA) 10 MG tablet, TAKE 1 TABLET BY MOUTH ONCE DAILY, Disp: 90 tablet, Rfl: 1   ezetimibe (ZETIA) 10 MG tablet, TAKE 1 TABLET BY MOUTH ONCE DAILY, Disp: 90 tablet, Rfl: 1   fluconazole (DIFLUCAN) 200 MG tablet, Take 1 tablet by mouth once weekly for 4 weeks, Disp: 4 tablet, Rfl: 1   levothyroxine (SYNTHROID) 112 MCG tablet, TAKE 1 TABLET (112 MCG TOTAL) BY MOUTH ONCE DAILY TAKE ON AN EMPTY STOMACH WITH A GLASS OF WATER AT LEAST 30-60 MINUTES BEFORE BREAKFAST., Disp: 90 tablet, Rfl: 1   Multiple Vitamins-Minerals (HAIR SKIN & NAILS ADVANCED PO), Take 1 tablet by mouth daily., Disp: , Rfl:    Na Sulfate-K Sulfate-Mg Sulf 17.5-3.13-1.6 GM/177ML SOLN, Take 1 Bottle by mouth as directed One kit contains 2 bottles.  Take both bottles at the times instructed by your provider., Disp: 354 mL, Rfl: 0   pantoprazole (PROTONIX) 40 MG tablet, TAKE ONE TABLET BY MOUTH ONCE DAILY, Disp: 90 tablet, Rfl: 1   tirzepatide (ZEPBOUND) 12.5 MG/0.5ML Pen, Inject 12.5 mg into the skin once a week., Disp: 2 mL, Rfl: 0   Vitamin D, Ergocalciferol, (DRISDOL) 1.25 MG (50000 UNIT) CAPS capsule, TAKE 1 CAPSULE BY MOUTH EVERY 7 DAYS, Disp: 12 capsule, Rfl: 0  Observations/Objective: Patient is well-developed, well-nourished in no acute distress.  Resting comfortably  at home.  Head is normocephalic, atraumatic.  No labored breathing.  Speech is clear and coherent with logical content.  Patient is alert and oriented at baseline.    Assessment and Plan: 1. Acute non-recurrent pansinusitis Continue over the counter decongestant until congestion resolves Continue daily antihistamine  - amoxicillin-clavulanate (AUGMENTIN) 875-125 MG tablet; Take 1 tablet by mouth 2 (two) times daily for 7 days. Take with food  Dispense: 14 tablet;  Refill: 0     Follow Up Instructions: I discussed the assessment and treatment plan with the patient. The patient was provided an opportunity to ask questions and all were answered. The patient agreed with the plan and demonstrated an understanding of the instructions.  A copy of instructions were sent to the patient via MyChart unless otherwise noted below.    The patient was advised to call back or seek an in-person evaluation if the symptoms worsen or if the condition fails to improve as anticipated.  Time:  I spent 10 minutes with the patient via telehealth technology discussing the above problems/concerns.    Apolonio Schneiders, FNP

## 2022-09-06 ENCOUNTER — Other Ambulatory Visit: Payer: Self-pay

## 2022-09-06 ENCOUNTER — Encounter (INDEPENDENT_AMBULATORY_CARE_PROVIDER_SITE_OTHER): Payer: Self-pay | Admitting: Family Medicine

## 2022-09-06 ENCOUNTER — Ambulatory Visit (INDEPENDENT_AMBULATORY_CARE_PROVIDER_SITE_OTHER): Payer: 59 | Admitting: Family Medicine

## 2022-09-06 VITALS — BP 116/75 | HR 89 | Temp 97.5°F | Ht 63.0 in | Wt 185.0 lb

## 2022-09-06 DIAGNOSIS — E559 Vitamin D deficiency, unspecified: Secondary | ICD-10-CM

## 2022-09-06 DIAGNOSIS — F3289 Other specified depressive episodes: Secondary | ICD-10-CM

## 2022-09-06 DIAGNOSIS — Z6832 Body mass index (BMI) 32.0-32.9, adult: Secondary | ICD-10-CM

## 2022-09-06 DIAGNOSIS — E669 Obesity, unspecified: Secondary | ICD-10-CM | POA: Diagnosis not present

## 2022-09-06 MED ORDER — ZEPBOUND 12.5 MG/0.5ML ~~LOC~~ SOAJ
12.5000 mg | SUBCUTANEOUS | 0 refills | Status: DC
  Filled 2022-09-06: qty 2, 28d supply, fill #0

## 2022-09-06 MED ORDER — ESCITALOPRAM OXALATE 20 MG PO TABS
20.0000 mg | ORAL_TABLET | Freq: Every morning | ORAL | 0 refills | Status: DC
  Filled 2022-09-06: qty 30, 30d supply, fill #0

## 2022-09-07 ENCOUNTER — Other Ambulatory Visit: Payer: Self-pay

## 2022-09-08 ENCOUNTER — Other Ambulatory Visit (HOSPITAL_BASED_OUTPATIENT_CLINIC_OR_DEPARTMENT_OTHER): Payer: Self-pay

## 2022-09-08 ENCOUNTER — Other Ambulatory Visit: Payer: Self-pay

## 2022-09-12 ENCOUNTER — Other Ambulatory Visit: Payer: Self-pay

## 2022-09-18 DIAGNOSIS — E039 Hypothyroidism, unspecified: Secondary | ICD-10-CM | POA: Diagnosis not present

## 2022-09-18 DIAGNOSIS — E782 Mixed hyperlipidemia: Secondary | ICD-10-CM | POA: Diagnosis not present

## 2022-09-20 ENCOUNTER — Other Ambulatory Visit: Payer: Self-pay

## 2022-09-20 DIAGNOSIS — E039 Hypothyroidism, unspecified: Secondary | ICD-10-CM | POA: Diagnosis not present

## 2022-09-20 DIAGNOSIS — Z6833 Body mass index (BMI) 33.0-33.9, adult: Secondary | ICD-10-CM | POA: Diagnosis not present

## 2022-09-20 DIAGNOSIS — R030 Elevated blood-pressure reading, without diagnosis of hypertension: Secondary | ICD-10-CM | POA: Diagnosis not present

## 2022-09-20 DIAGNOSIS — E6609 Other obesity due to excess calories: Secondary | ICD-10-CM | POA: Diagnosis not present

## 2022-09-20 DIAGNOSIS — E782 Mixed hyperlipidemia: Secondary | ICD-10-CM | POA: Diagnosis not present

## 2022-09-20 DIAGNOSIS — K219 Gastro-esophageal reflux disease without esophagitis: Secondary | ICD-10-CM | POA: Diagnosis not present

## 2022-09-20 MED ORDER — PANTOPRAZOLE SODIUM 20 MG PO TBEC
20.0000 mg | DELAYED_RELEASE_TABLET | Freq: Every day | ORAL | 1 refills | Status: DC
  Filled 2022-09-20: qty 90, 90d supply, fill #0
  Filled 2023-04-16: qty 90, 90d supply, fill #1

## 2022-09-20 NOTE — Progress Notes (Signed)
Chief Complaint:   OBESITY Katherine Smith is here to discuss her progress with her obesity treatment plan along with follow-up of her obesity related diagnoses. Katherine Smith is on the Category 2 Plan and states she is following her eating plan approximately 90% of the time. Katherine Smith states she is doing 0 minutes 0 times per week.  Today's visit was #: 74 Starting weight: 240 lbs Starting date: 08/10/2016 Today's weight: 185 lbs Today's date: 09/06/2022 Total lbs lost to date: 55 Total lbs lost since last in-office visit: 5  Interim History: Katherine Smith has done well with weight loss. Her hunger is controlled. She struggled to eat everything on her plan. She is also trying to increase her exercise, but this has been limited while she is sick.   Subjective:   1. Vitamin D deficiency Katherine Smith is on Vitamin D. No problems were mentions and no over-replacement.   2. Emotional Eating Behavior Katherine Smith is stable on Lexapro. She feels her mood is good and she is not having worsening sleep.   Assessment/Plan:   1. Vitamin D deficiency Katherine Smith will continue Vitamin D supplementation.   2. Emotional Eating Behavior Katherine Smith will continue Lexapro, and we will refill for 1 month.   - escitalopram (LEXAPRO) 20 MG tablet; Take 1 tablet (20 mg total) by mouth every morning.  Dispense: 30 tablet; Refill: 0  3. BMI 32.0-32.9,adult  4. Obesity, Beginning BMI 42.51  5. Obesity, Current BMI 32.8 We will refill Zepbound for 1 month.   - tirzepatide (ZEPBOUND) 12.5 MG/0.5ML Pen; Inject 12.5 mg into the skin once a week.  Dispense: 2 mL; Refill: 0  Katherine Smith is currently in the action stage of change. As such, her goal is to continue with weight loss efforts. She has agreed to the Category 1 Plan.   Exercise goals: Work on routine strengthening.   Behavioral modification strategies: increasing lean protein intake.  Katherine Smith has agreed to follow-up with our clinic in 4 weeks. She was informed of the importance of  frequent follow-up visits to maximize her success with intensive lifestyle modifications for her multiple health conditions.   Objective:   Blood pressure 116/75, pulse 89, temperature (!) 97.5 F (36.4 C), height '5\' 3"'$  (1.6 m), weight 185 lb (83.9 kg), last menstrual period 04/14/2016, SpO2 97 %. Body mass index is 32.77 kg/m.  General: Cooperative, alert, well developed, in no acute distress. HEENT: Conjunctivae and lids unremarkable. Cardiovascular: Regular rhythm.  Lungs: Normal work of breathing. Neurologic: No focal deficits.   Lab Results  Component Value Date   CREATININE 0.93 07/07/2020   BUN 17 07/07/2020   NA 144 07/07/2020   K 4.1 07/07/2020   CL 104 07/07/2020   CO2 25 07/07/2020   Lab Results  Component Value Date   ALT 16 07/07/2020   AST 19 07/07/2020   ALKPHOS 88 07/07/2020   BILITOT 0.5 07/07/2020   Lab Results  Component Value Date   HGBA1C 5.2 07/07/2020   HGBA1C 4.9 02/25/2020   HGBA1C 5.2 08/05/2019   HGBA1C 5.1 11/23/2016   HGBA1C 5.3 08/10/2016   Lab Results  Component Value Date   INSULIN 13.9 07/07/2020   INSULIN 19.3 02/25/2020   INSULIN 16.3 08/05/2019   INSULIN 25.8 (H) 05/08/2019   INSULIN 46.2 (H) 10/02/2018   Lab Results  Component Value Date   TSH 1.340 07/07/2020   Lab Results  Component Value Date   CHOL 254 (H) 07/07/2020   HDL 60 07/07/2020   LDLCALC 174 (H) 07/07/2020  TRIG 115 07/07/2020   Lab Results  Component Value Date   VD25OH 44.0 07/07/2020   VD25OH 40.1 02/25/2020   VD25OH 37.6 08/05/2019   Lab Results  Component Value Date   WBC 5.5 07/07/2020   HGB 13.5 07/07/2020   HCT 41.1 07/07/2020   MCV 85 07/07/2020   PLT 297 07/07/2020   No results found for: "IRON", "TIBC", "FERRITIN"  Attestation Statements:   Reviewed by clinician on day of visit: allergies, medications, problem list, medical history, surgical history, family history, social history, and previous encounter notes.   I, Trixie Dredge, am acting as transcriptionist for Dennard Nip, MD.  I have reviewed the above documentation for accuracy and completeness, and I agree with the above. -  Dennard Nip, MD

## 2022-10-11 ENCOUNTER — Other Ambulatory Visit: Payer: Self-pay

## 2022-10-11 ENCOUNTER — Encounter (INDEPENDENT_AMBULATORY_CARE_PROVIDER_SITE_OTHER): Payer: Self-pay | Admitting: Family Medicine

## 2022-10-11 ENCOUNTER — Ambulatory Visit (INDEPENDENT_AMBULATORY_CARE_PROVIDER_SITE_OTHER): Payer: 59 | Admitting: Family Medicine

## 2022-10-11 VITALS — BP 120/78 | HR 77 | Temp 97.9°F | Ht 63.0 in | Wt 185.0 lb

## 2022-10-11 DIAGNOSIS — Z6832 Body mass index (BMI) 32.0-32.9, adult: Secondary | ICD-10-CM

## 2022-10-11 DIAGNOSIS — R632 Polyphagia: Secondary | ICD-10-CM | POA: Diagnosis not present

## 2022-10-11 DIAGNOSIS — E669 Obesity, unspecified: Secondary | ICD-10-CM

## 2022-10-11 DIAGNOSIS — E559 Vitamin D deficiency, unspecified: Secondary | ICD-10-CM | POA: Diagnosis not present

## 2022-10-11 DIAGNOSIS — F3289 Other specified depressive episodes: Secondary | ICD-10-CM | POA: Diagnosis not present

## 2022-10-11 MED ORDER — ZEPBOUND 12.5 MG/0.5ML ~~LOC~~ SOAJ
12.5000 mg | SUBCUTANEOUS | 0 refills | Status: DC
  Filled 2022-10-11: qty 6, 84d supply, fill #0

## 2022-10-11 MED ORDER — VITAMIN D (ERGOCALCIFEROL) 1.25 MG (50000 UNIT) PO CAPS
50000.0000 [IU] | ORAL_CAPSULE | ORAL | 0 refills | Status: DC
  Filled 2022-10-11: qty 12, 84d supply, fill #0

## 2022-10-11 MED ORDER — ESCITALOPRAM OXALATE 20 MG PO TABS
20.0000 mg | ORAL_TABLET | Freq: Every morning | ORAL | 0 refills | Status: DC
  Filled 2022-10-11: qty 90, 90d supply, fill #0

## 2022-10-12 ENCOUNTER — Other Ambulatory Visit: Payer: Self-pay

## 2022-10-17 ENCOUNTER — Other Ambulatory Visit: Payer: Self-pay | Admitting: Obstetrics and Gynecology

## 2022-10-17 DIAGNOSIS — Z1231 Encounter for screening mammogram for malignant neoplasm of breast: Secondary | ICD-10-CM

## 2022-10-23 ENCOUNTER — Other Ambulatory Visit: Payer: Self-pay

## 2022-10-27 ENCOUNTER — Other Ambulatory Visit: Payer: Self-pay

## 2022-10-30 NOTE — Progress Notes (Unsigned)
Chief Complaint:   OBESITY Katherine Smith is here to discuss her progress with her obesity treatment plan along with follow-up of her obesity related diagnoses. Katherine Smith is on the Category 1 Plan and states she is following her eating plan approximately 60% of the time. Katherine Smith states she is doing 0 minutes 0 times per week.  Today's visit was #: 60 Starting weight: 240 lbs Starting date: 08/10/2016 Today's weight: 185 lbs Today's date: 10/11/2022 Total lbs lost to date: 55 Total lbs lost since last in-office visit: 0  Interim History: Katherine Smith has been on vacation and she did some celebration eating.  She did well with avoiding weight gain which is excellent.  Subjective:   1. Polyphagia Katherine Smith notes polyphagia is improved on Zepbound with minimal side effects.  2. Vitamin D deficiency Katherine Smith is on vitamin D, and she denies nausea, vomiting, or muscle weakness.  3. Emotional Eating Behavior Katherine Smith is doing well on Lexapro.  She notes no problems with sleep and she is decreasing emotional eating behaviors.  Assessment/Plan:   1. Polyphagia Katherine Smith will continue Zepbound, and we will refill for 90 days.  - tirzepatide (ZEPBOUND) 12.5 MG/0.5ML Pen; Inject 12.5 mg into the skin once a week.  Dispense: 6 mL; Refill: 0  2. Vitamin D deficiency Katherine Smith will continue prescription vitamin D, and we will refill for 90 days.  - Vitamin D, Ergocalciferol, (DRISDOL) 1.25 MG (50000 UNIT) CAPS capsule; Take 1 capsule (50,000 Units total) by mouth every 7 (seven) days.  Dispense: 12 capsule; Refill: 0  3. Emotional Eating Behavior Katherine Smith will continue Lexapro, and we will refill for 90 days.  - escitalopram (LEXAPRO) 20 MG tablet; Take 1 tablet (20 mg total) by mouth every morning.  Dispense: 90 tablet; Refill: 0  4. BMI 32.0-32.9,adult  5. Obesity, Beginning BMI 42.51 Katherine Smith is currently in the action stage of change. As such, her goal is to continue with weight loss efforts. She has agreed  to the Category 1 Plan.   Behavioral modification strategies: increasing lean protein intake.  Katherine Smith has agreed to follow-up with our clinic in 4 weeks. She was informed of the importance of frequent follow-up visits to maximize her success with intensive lifestyle modifications for her multiple health conditions.   Objective:   Blood pressure 120/78, pulse 77, temperature 97.9 F (36.6 C), height 5\' 3"  (1.6 m), weight 185 lb (83.9 kg), last menstrual period 04/14/2016, SpO2 97 %. Body mass index is 32.77 kg/m.  Lab Results  Component Value Date   CREATININE 0.93 07/07/2020   BUN 17 07/07/2020   NA 144 07/07/2020   K 4.1 07/07/2020   CL 104 07/07/2020   CO2 25 07/07/2020   Lab Results  Component Value Date   ALT 16 07/07/2020   AST 19 07/07/2020   ALKPHOS 88 07/07/2020   BILITOT 0.5 07/07/2020   Lab Results  Component Value Date   HGBA1C 5.2 07/07/2020   HGBA1C 4.9 02/25/2020   HGBA1C 5.2 08/05/2019   HGBA1C 5.1 11/23/2016   HGBA1C 5.3 08/10/2016   Lab Results  Component Value Date   INSULIN 13.9 07/07/2020   INSULIN 19.3 02/25/2020   INSULIN 16.3 08/05/2019   INSULIN 25.8 (H) 05/08/2019   INSULIN 46.2 (H) 10/02/2018   Lab Results  Component Value Date   TSH 1.340 07/07/2020   Lab Results  Component Value Date   CHOL 254 (H) 07/07/2020   HDL 60 07/07/2020   LDLCALC 174 (H) 07/07/2020   TRIG 115  07/07/2020   Lab Results  Component Value Date   VD25OH 44.0 07/07/2020   VD25OH 40.1 02/25/2020   VD25OH 37.6 08/05/2019   Lab Results  Component Value Date   WBC 5.5 07/07/2020   HGB 13.5 07/07/2020   HCT 41.1 07/07/2020   MCV 85 07/07/2020   PLT 297 07/07/2020   No results found for: "IRON", "TIBC", "FERRITIN"  Attestation Statements:   Reviewed by clinician on day of visit: allergies, medications, problem list, medical history, surgical history, family history, social history, and previous encounter notes.   I, Trixie Dredge, am acting as  transcriptionist for Dennard Nip, MD.  I have reviewed the above documentation for accuracy and completeness, and I agree with the above. -  Dennard Nip, MD

## 2022-11-05 ENCOUNTER — Other Ambulatory Visit: Payer: Self-pay

## 2022-11-06 ENCOUNTER — Other Ambulatory Visit: Payer: Self-pay

## 2022-11-06 MED ORDER — EZETIMIBE 10 MG PO TABS
10.0000 mg | ORAL_TABLET | Freq: Every day | ORAL | 1 refills | Status: DC
  Filled 2022-11-06: qty 90, 90d supply, fill #0
  Filled 2023-02-13: qty 90, 90d supply, fill #1

## 2022-11-08 ENCOUNTER — Ambulatory Visit (INDEPENDENT_AMBULATORY_CARE_PROVIDER_SITE_OTHER): Payer: 59 | Admitting: Family Medicine

## 2022-11-08 ENCOUNTER — Encounter (INDEPENDENT_AMBULATORY_CARE_PROVIDER_SITE_OTHER): Payer: Self-pay | Admitting: Family Medicine

## 2022-11-08 VITALS — BP 113/75 | HR 77 | Temp 97.5°F | Ht 63.0 in | Wt 184.0 lb

## 2022-11-08 DIAGNOSIS — R632 Polyphagia: Secondary | ICD-10-CM

## 2022-11-08 DIAGNOSIS — Z6832 Body mass index (BMI) 32.0-32.9, adult: Secondary | ICD-10-CM

## 2022-11-08 DIAGNOSIS — E669 Obesity, unspecified: Secondary | ICD-10-CM

## 2022-11-10 ENCOUNTER — Other Ambulatory Visit: Payer: Self-pay

## 2022-11-13 NOTE — Progress Notes (Unsigned)
Chief Complaint:   OBESITY Katherine Smith is here to discuss her progress with her obesity treatment plan along with follow-up of her obesity related diagnoses. Katherine Smith is on the Category 1 Plan and states she is following her eating plan approximately 75% of the time. Katherine Smith states she is doing 0 minutes 0 times per week.  Today's visit was #: 86 Starting weight: 240 lbs Starting date: 08/10/2016 Today's weight: 184 lbs Today's date: 11/08/2022 Total lbs lost to date: 56 Total lbs lost since last in-office visit: 1  Interim History: Katherine Smith continues to work on her weight loss.  She has increased stress at work and family stress as well.  She is struggling to motivate herself to exercise regularly.  Subjective:   1. Polyphagia Katherine Smith's symptoms have improved on Zepbound.  She is doing better with portion control with no nausea or vomiting.  Assessment/Plan:   1. Polyphagia Katherine Smith is to continue Zepbound at 12.5 mg and we will try to get 1 more refill covered by her insurance next month.  2. BMI 32.0-32.9,adult  3. Obesity, Beginning BMI 42.51 Katherine Smith is currently in the action stage of change. As such, her goal is to continue with weight loss efforts. She has agreed to the Category 1 Plan.   Behavioral modification strategies: no skipping meals and meal planning and cooking strategies.  Katherine Smith has agreed to follow-up with our clinic in 4 weeks. She was informed of the importance of frequent follow-up visits to maximize her success with intensive lifestyle modifications for her multiple health conditions.   Objective:   Blood pressure 113/75, pulse 77, temperature (!) 97.5 F (36.4 C), height 5\' 3"  (1.6 m), weight 184 lb (83.5 kg), last menstrual period 04/14/2016, SpO2 96 %. Body mass index is 32.59 kg/m.  Lab Results  Component Value Date   CREATININE 0.93 07/07/2020   BUN 17 07/07/2020   NA 144 07/07/2020   K 4.1 07/07/2020   CL 104 07/07/2020   CO2 25 07/07/2020    Lab Results  Component Value Date   ALT 16 07/07/2020   AST 19 07/07/2020   ALKPHOS 88 07/07/2020   BILITOT 0.5 07/07/2020   Lab Results  Component Value Date   HGBA1C 5.2 07/07/2020   HGBA1C 4.9 02/25/2020   HGBA1C 5.2 08/05/2019   HGBA1C 5.1 11/23/2016   HGBA1C 5.3 08/10/2016   Lab Results  Component Value Date   INSULIN 13.9 07/07/2020   INSULIN 19.3 02/25/2020   INSULIN 16.3 08/05/2019   INSULIN 25.8 (H) 05/08/2019   INSULIN 46.2 (H) 10/02/2018   Lab Results  Component Value Date   TSH 1.340 07/07/2020   Lab Results  Component Value Date   CHOL 254 (H) 07/07/2020   HDL 60 07/07/2020   LDLCALC 174 (H) 07/07/2020   TRIG 115 07/07/2020   Lab Results  Component Value Date   VD25OH 44.0 07/07/2020   VD25OH 40.1 02/25/2020   VD25OH 37.6 08/05/2019   Lab Results  Component Value Date   WBC 5.5 07/07/2020   HGB 13.5 07/07/2020   HCT 41.1 07/07/2020   MCV 85 07/07/2020   PLT 297 07/07/2020   No results found for: "IRON", "TIBC", "FERRITIN"  Attestation Statements:   Reviewed by clinician on day of visit: allergies, medications, problem list, medical history, surgical history, family history, social history, and previous encounter notes.  Time spent on visit including pre-visit chart review and post-visit care and charting was 30 minutes.   Wilhemena Durie, am acting  as transcriptionist for Dennard Nip, MD.  I have reviewed the above documentation for accuracy and completeness, and I agree with the above. -  Dennard Nip, MD

## 2022-11-22 ENCOUNTER — Ambulatory Visit
Admission: RE | Admit: 2022-11-22 | Discharge: 2022-11-22 | Disposition: A | Discharge status: Home / Self Care | Payer: 59 | Source: Ambulatory Visit | Attending: Obstetrics and Gynecology | Admitting: Obstetrics and Gynecology

## 2022-11-22 ENCOUNTER — Encounter: Payer: Self-pay | Admitting: Internal Medicine

## 2022-11-22 DIAGNOSIS — Z1231 Encounter for screening mammogram for malignant neoplasm of breast: Secondary | ICD-10-CM | POA: Insufficient documentation

## 2022-11-28 ENCOUNTER — Encounter: Payer: Self-pay | Admitting: Internal Medicine

## 2022-11-29 ENCOUNTER — Other Ambulatory Visit: Payer: Self-pay

## 2022-11-29 ENCOUNTER — Ambulatory Visit
Admission: RE | Admit: 2022-11-29 | Discharge: 2022-11-29 | Disposition: A | Discharge status: Home / Self Care | Payer: 59 | Attending: Internal Medicine | Admitting: Internal Medicine

## 2022-11-29 ENCOUNTER — Ambulatory Visit: Payer: 59 | Admitting: Certified Registered"

## 2022-11-29 ENCOUNTER — Encounter: Payer: Self-pay | Admitting: Internal Medicine

## 2022-11-29 ENCOUNTER — Encounter
Admission: RE | Disposition: A | Discharge status: Home / Self Care | Payer: Self-pay | Source: Home / Self Care | Attending: Internal Medicine

## 2022-11-29 DIAGNOSIS — Z09 Encounter for follow-up examination after completed treatment for conditions other than malignant neoplasm: Secondary | ICD-10-CM | POA: Insufficient documentation

## 2022-11-29 DIAGNOSIS — Z1211 Encounter for screening for malignant neoplasm of colon: Secondary | ICD-10-CM | POA: Insufficient documentation

## 2022-11-29 DIAGNOSIS — G4730 Sleep apnea, unspecified: Secondary | ICD-10-CM | POA: Insufficient documentation

## 2022-11-29 DIAGNOSIS — E063 Autoimmune thyroiditis: Secondary | ICD-10-CM | POA: Diagnosis not present

## 2022-11-29 DIAGNOSIS — I10 Essential (primary) hypertension: Secondary | ICD-10-CM | POA: Diagnosis not present

## 2022-11-29 DIAGNOSIS — Z79899 Other long term (current) drug therapy: Secondary | ICD-10-CM | POA: Insufficient documentation

## 2022-11-29 DIAGNOSIS — F419 Anxiety disorder, unspecified: Secondary | ICD-10-CM | POA: Diagnosis not present

## 2022-11-29 DIAGNOSIS — D120 Benign neoplasm of cecum: Secondary | ICD-10-CM | POA: Diagnosis not present

## 2022-11-29 DIAGNOSIS — F32A Depression, unspecified: Secondary | ICD-10-CM | POA: Insufficient documentation

## 2022-11-29 DIAGNOSIS — K635 Polyp of colon: Secondary | ICD-10-CM | POA: Diagnosis not present

## 2022-11-29 DIAGNOSIS — D122 Benign neoplasm of ascending colon: Secondary | ICD-10-CM | POA: Insufficient documentation

## 2022-11-29 DIAGNOSIS — Z86010 Personal history of colonic polyps: Secondary | ICD-10-CM | POA: Diagnosis not present

## 2022-11-29 DIAGNOSIS — K219 Gastro-esophageal reflux disease without esophagitis: Secondary | ICD-10-CM | POA: Diagnosis not present

## 2022-11-29 HISTORY — PX: COLONOSCOPY: SHX5424

## 2022-11-29 SURGERY — COLONOSCOPY
Anesthesia: General

## 2022-11-29 MED ORDER — SODIUM CHLORIDE 0.9 % IV SOLN: INTRAVENOUS | Status: DC

## 2022-11-29 MED ORDER — LIDOCAINE HCL (CARDIAC) PF 100 MG/5ML IV SOSY
PREFILLED_SYRINGE | INTRAVENOUS | Status: DC | PRN
  Administered 2022-11-29: 100 mg via INTRAVENOUS

## 2022-11-29 MED ORDER — PROPOFOL 10 MG/ML IV BOLUS
INTRAVENOUS | Status: DC | PRN
  Administered 2022-11-29: 50 mg via INTRAVENOUS
  Administered 2022-11-29: 150 ug/kg/min via INTRAVENOUS

## 2022-11-29 NOTE — H&P (Signed)
Outpatient short stay form Pre-procedure 11/29/2022 10:40 AM Katherine Smith K. Katherine Smith, M.D.  Primary Physician: Katherine Smith, M.D.  Reason for visit:  Personal history of adenomatous colon polyp x 1 (04/04/17 colonoscopy).  History of present illness:                            Patient presents for colonoscopy for a personal hx of colon polyps. The patient denies abdominal pain, abnormal weight loss or rectal bleeding.      Current Facility-Administered Medications:    0.9 %  sodium chloride infusion, , Intravenous, Continuous, [place], Boykin Nearing, MD, Last Rate: 20 mL/hr at 11/29/22 1037, Continued from Pre-op at 11/29/22 1037  Medications Prior to Admission  Medication Sig Dispense Refill Last Dose   escitalopram (LEXAPRO) 20 MG tablet Take 1 tablet (20 mg total) by mouth every morning. 90 tablet 0 11/28/2022   ezetimibe (ZETIA) 10 MG tablet TAKE 1 TABLET BY MOUTH ONCE DAILY 90 tablet 1 11/28/2022   levothyroxine (SYNTHROID) 112 MCG tablet TAKE 1 TABLET (112 MCG TOTAL) BY MOUTH ONCE DAILY TAKE ON AN EMPTY STOMACH WITH A GLASS OF WATER AT LEAST 30-60 MINUTES BEFORE BREAKFAST. 90 tablet 1 11/28/2022   Vitamin D, Ergocalciferol, (DRISDOL) 1.25 MG (50000 UNIT) CAPS capsule Take 1 capsule (50,000 Units total) by mouth every 7 (seven) days. 12 capsule 0 Past Week   clobetasol ointment (TEMOVATE) 0.05 % Apply to affected areas twice daily for flares and taper to three times weekly for maintenance 30 g 2    clobetasol ointment (TEMOVATE) 0.05 % Apply to affected areas twice daily for flares and taper to three times weekly for maintenance 30 g 2    COVID-19 mRNA vaccine 2023-2024 (COMIRNATY) syringe Inject into the muscle. 0.3 mL 0    ezetimibe (ZETIA) 10 MG tablet TAKE 1 TABLET BY MOUTH ONCE DAILY 90 tablet 1    ezetimibe (ZETIA) 10 MG tablet TAKE 1 TABLET BY MOUTH ONCE DAILY 90 tablet 1    fluconazole (DIFLUCAN) 200 MG tablet Take 1 tablet by mouth once weekly for 4 weeks (Patient not taking: Reported  on 11/29/2022) 4 tablet 1 Completed Course   Multiple Vitamins-Minerals (HAIR SKIN & NAILS ADVANCED PO) Take 1 tablet by mouth daily. (Patient not taking: Reported on 11/29/2022)   Not Taking   Na Sulfate-K Sulfate-Mg Sulf 17.5-3.13-1.6 GM/177ML SOLN Take 1 Bottle by mouth as directed One kit contains 2 bottles.  Take both bottles at the times instructed by your provider. 354 mL 0    pantoprazole (PROTONIX) 20 MG tablet Take 1 tablet (20 mg total) by mouth daily. 90 tablet 1    pantoprazole (PROTONIX) 40 MG tablet TAKE ONE TABLET BY MOUTH ONCE DAILY 90 tablet 1    tirzepatide (ZEPBOUND) 12.5 MG/0.5ML Pen Inject 12.5 mg into the skin once a week. 6 mL 0 11/19/2022     Allergies  Allergen Reactions   Atorvastatin     Other reaction(s): Muscle Pain   Dexfenfluramine     Other reaction(s): Unknown     Past Medical History:  Diagnosis Date   Anxiety    Back pain    Cancer    BASAL CELL SKIN CANCER   Depression    Fatty liver    GERD (gastroesophageal reflux disease)    Hashimoto's thyroiditis    HTN (hypertension)    Hyperlipidemia    Hypothyroidism    IBS (irritable bowel syndrome)    Insomnia  Joint pain    Lactose intolerance    Lichen sclerosus    Obesity    Psoriasis    Sleep apnea     Review of systems:  Otherwise negative.    Physical Exam  Gen: Alert, oriented. Appears stated age.  HEENT: [place]/AT. PERRLA. Lungs: CTA, no wheezes. CV: RR nl S1, S2. Abd: soft, benign, no masses. BS+ Ext: No edema. Pulses 2+    Planned procedures: Proceed with colonoscopy. The patient understands the nature of the planned procedure, indications, risks, alternatives and potential complications including but not limited to bleeding, infection, perforation, damage to internal organs and possible oversedation/side effects from anesthesia. The patient agrees and gives consent to proceed.  Please refer to procedure notes for findings, recommendations and patient disposition/instructions.      [name] K. Katherine Smith, M.D. Gastroenterology 11/29/2022  10:40 AM

## 2022-11-29 NOTE — Interval H&P Note (Signed)
History and Physical Interval Note:  11/29/2022 10:41 AM  Katherine Smith  has presented today for surgery, with the diagnosis of History of adenomatous polyp of colon (Z86.010).  The various methods of treatment have been discussed with the patient and family. After consideration of risks, benefits and other options for treatment, the patient has consented to  Procedure(s) with comments: COLONOSCOPY (N/A) - DM as a surgical intervention.  The patient's history has been reviewed, patient examined, no change in status, stable for surgery.  I have reviewed the patient's chart and labs.  Questions were answered to the patient's satisfaction.     [place], [place]

## 2022-11-29 NOTE — Anesthesia Preprocedure Evaluation (Signed)
Anesthesia Evaluation  Patient identified by MRN, date of birth, ID band Patient awake    Reviewed: Allergy & Precautions, NPO status , Patient's Chart, lab work & pertinent test results  Airway Mallampati: II  TM Distance: >3 FB Neck ROM: Full    Dental  (+) Teeth Intact, Caps,    Pulmonary neg pulmonary ROS, sleep apnea    Pulmonary exam normal breath sounds clear to auscultation       Cardiovascular Exercise Tolerance: Good hypertension, Pt. on medications negative cardio ROS Normal cardiovascular exam Rhythm:Regular Rate:Normal     Neuro/Psych   Anxiety     negative neurological ROS  negative psych ROS   GI/Hepatic negative GI ROS, Neg liver ROS,GERD  Medicated,,  Endo/Other  negative endocrine ROSHypothyroidism    Renal/GU negative Renal ROS  negative genitourinary   Musculoskeletal   Abdominal  (+) + obese  Peds negative pediatric ROS (+)  Hematology negative hematology ROS (+)   Anesthesia Other Findings Past Medical History: No date: Anxiety No date: Back pain No date: Cancer     Comment:  BASAL CELL SKIN CANCER No date: Depression No date: Fatty liver No date: GERD (gastroesophageal reflux disease) No date: Hashimoto's thyroiditis No date: HTN (hypertension) No date: Hyperlipidemia No date: Hypothyroidism No date: IBS (irritable bowel syndrome) No date: Insomnia No date: Joint pain No date: Lactose intolerance No date: Lichen sclerosus No date: Obesity No date: Psoriasis No date: Sleep apnea  Past Surgical History: No date: ANTERIOR DISC ARTHROPLASTY     Comment:  2004 No date: CATARACT EXTRACTION W/ INTRAOCULAR LENS IMPLANT; Bilateral 12/18/2011: COLONOSCOPY 04/04/2017: COLONOSCOPY WITH PROPOFOL; N/A     Comment:  Procedure: COLONOSCOPY WITH PROPOFOL;  Surgeon: Scot Jun, MD;  Location: [facility] ENDOSCOPY;  Service:               Endoscopy;  Laterality:  N/A; 07/12/2020: SHOULDER ARTHROSCOPY WITH ROTATOR CUFF REPAIR AND  SUBACROMIAL DECOMPRESSION; Left     Comment:  Procedure: SHOULDER ARTHROSCOPY WITH ARTHROSCOPIC                ROTATOR CUFF REPAIR AND SUBACROMIAL DECOMPRESSION;                Surgeon: Jones Broom, MD;  Location: [facility];  Service: Orthopedics;  Laterality: Left;  BMI    Body Mass Index: 33.48 kg/m      Reproductive/Obstetrics negative OB ROS                             Anesthesia Physical Anesthesia Plan  ASA: 2  Anesthesia Plan: General   Post-op Pain Management:    Induction: Intravenous  PONV Risk Score and Plan: Propofol infusion and TIVA  Airway Management Planned: Natural Airway  Additional Equipment:   Intra-op Plan:   Post-operative Plan:   Informed Consent: I have reviewed the patients History and Physical, chart, labs and discussed the procedure including the risks, benefits and alternatives for the proposed anesthesia with the patient or authorized representative who has indicated his/her understanding and acceptance.     Dental Advisory Given  Plan Discussed with: CRNA and Surgeon  Anesthesia Plan Comments:        Anesthesia Quick Evaluation

## 2022-11-29 NOTE — Transfer of Care (Signed)
Immediate Anesthesia Transfer of Care Note  Patient: Katherine Smith  Procedure(s) Performed: COLONOSCOPY  Patient Location: PACU  Anesthesia Type:General  Level of Consciousness: drowsy  Airway & Oxygen Therapy: Patient Spontanous Breathing and Patient connected to nasal cannula oxygen  Post-op Assessment: Report given to RN and Post -op Vital signs reviewed and stable  Post vital signs: stable  Last Vitals:  Vitals Value Taken Time  BP    Temp    Pulse    Resp    SpO2      Last Pain:  Vitals:   11/29/22 1000  PainSc: 0-No pain         Complications: No notable events documented.

## 2022-11-29 NOTE — Anesthesia Postprocedure Evaluation (Signed)
Anesthesia Post Note  Patient: Katherine Smith  Procedure(s) Performed: COLONOSCOPY  Patient location during evaluation: PACU Anesthesia Type: General Level of consciousness: awake and oriented Pain management: pain level controlled Vital Signs Assessment: post-procedure vital signs reviewed and stable Respiratory status: spontaneous breathing and respiratory function stable Cardiovascular status: stable Anesthetic complications: no   There were no known notable events for this encounter.   Last Vitals:  Vitals:   11/29/22 1000 11/29/22 1121  BP: 136/79 105/67  Pulse: 72 71  Resp: 18 12  Temp: (!) 36.2 C (!) 35.6 C  SpO2: 98% 98%    Last Pain:  Vitals:   11/29/22 1121  TempSrc: Tympanic  PainSc: Asleep                 VAN STAVEREN,Jazmin Ley

## 2022-11-29 NOTE — Op Note (Signed)
[facility] Patient Name: Katherine Smith Procedure Date: 11/29/2022 10:45 AM MRN: [id number] Account #: [id number] Date of Birth: 1961/08/11 Admit Type: Outpatient Age: 62 Room: [facility] ENDO ROOM 2 Gender: Female Note Status: Finalized Instrument Name: Prentice Docker [id number] Procedure:             Colonoscopy Indications:           Surveillance: Personal history of adenomatous polyps                         on last colonoscopy > 5 years ago Providers:             Royce Macadamia K. Norma Fredrickson MD, MD Referring MD:          Marisue Ivan (Referring MD) Medicines:             Propofol per Anesthesia Complications:         No immediate complications. Procedure:             Pre-Anesthesia Assessment:                        - The risks and benefits of the procedure and the                         sedation options and risks were discussed with the                         patient. All questions were answered and informed                         consent was obtained.                        - Patient identification and proposed procedure were                         verified prior to the procedure by the nurse. The                         procedure was verified in the procedure room.                        - After reviewing the risks and benefits, the patient                         was deemed in satisfactory condition to undergo the                         procedure in an ambulatory setting.                        - ASA Grade Assessment: III - A patient with severe                         systemic disease.                        After obtaining informed consent, the colonoscope was  passed under direct vision. Throughout the procedure,                         the patient's blood pressure, pulse, and oxygen                         saturations were monitored continuously. The                         Colonoscope was introduced through the anus and                          advanced to the the cecum, identified by appendiceal                         orifice and ileocecal valve. The colonoscopy was                         performed without difficulty. The patient tolerated                         the procedure well. The quality of the bowel                         preparation was good. The ileocecal valve, appendiceal                         orifice, and rectum were photographed. Findings:      The perianal and digital rectal examinations were normal. Pertinent       negatives include normal sphincter tone and no palpable rectal lesions.      An 8 mm polyp was found in the cecum. The polyp was sessile. The polyp       was removed with a cold snare. Resection and retrieval were complete.      A 10 mm polyp was found in the ascending colon. The polyp was sessile.       The polyp was removed with a cold snare. Resection was complete, but the       polyp tissue was only partially retrieved.      A 3 mm polyp was found in the ascending colon. The polyp was sessile.       The polyp was removed with a jumbo cold forceps. Resection and retrieval       were complete.      The exam was otherwise without abnormality on direct and retroflexion       views. Impression:            - One 8 mm polyp in the cecum, removed with a cold                         snare. Resected and retrieved.                        - One 10 mm polyp in the ascending colon, removed with                         a cold snare. Complete resection. Partial retrieval.                        -  One 3 mm polyp in the ascending colon, removed with                         a jumbo cold forceps. Resected and retrieved.                        - The examination was otherwise normal on direct and                         retroflexion views. Recommendation:        - Patient has a contact number available for                         emergencies. The signs and symptoms of potential                          delayed complications were discussed with the patient.                         Return to normal activities tomorrow. Written                         discharge instructions were provided to the patient.                        - Resume previous diet.                        - Continue present medications.                        - Repeat colonoscopy is recommended for surveillance.                         The colonoscopy date will be determined after                         pathology results from today's exam become available                         for review.                        - Return to GI office PRN.                        - The findings and recommendations were discussed with                         the patient. Procedure Code(s):     --- Professional ---                        [phone], Colonoscopy, flexible; with removal of                         tumor(s), polyp(s), or other lesion(s) by snare                         technique  16109, 59, Colonoscopy, flexible; with biopsy, single                         or multiple Diagnosis Code(s):     --- Professional ---                        D12.0, Benign neoplasm of cecum                        D12.2, Benign neoplasm of ascending colon                        Z86.010, Personal history of colonic polyps CPT copyright 2022 [employer]. All rights reserved. The codes documented in this report are preliminary and upon coder review may  be revised to meet current compliance requirements. Stanton Kidney MD, MD 11/29/2022 11:10:24 AM This report has been signed electronically. Number of Addenda: 0 Note Initiated On: 11/29/2022 10:45 AM Scope Withdrawal Time: 0 hours 9 minutes 30 seconds  Total Procedure Duration: 0 hours 12 minutes 36 seconds  Estimated Blood Loss:  Estimated blood loss: none.      [facility]

## 2022-11-30 ENCOUNTER — Encounter: Payer: Self-pay | Admitting: Internal Medicine

## 2022-11-30 LAB — SURGICAL PATHOLOGY

## 2022-12-06 ENCOUNTER — Ambulatory Visit (INDEPENDENT_AMBULATORY_CARE_PROVIDER_SITE_OTHER): Payer: 59 | Admitting: Family Medicine

## 2022-12-06 DIAGNOSIS — H43813 Vitreous degeneration, bilateral: Secondary | ICD-10-CM | POA: Diagnosis not present

## 2022-12-06 DIAGNOSIS — H35379 Puckering of macula, unspecified eye: Secondary | ICD-10-CM | POA: Diagnosis not present

## 2022-12-06 DIAGNOSIS — M3501 Sicca syndrome with keratoconjunctivitis: Secondary | ICD-10-CM | POA: Diagnosis not present

## 2022-12-06 DIAGNOSIS — Z961 Presence of intraocular lens: Secondary | ICD-10-CM | POA: Diagnosis not present

## 2023-01-03 ENCOUNTER — Other Ambulatory Visit: Payer: Self-pay

## 2023-01-03 ENCOUNTER — Ambulatory Visit (INDEPENDENT_AMBULATORY_CARE_PROVIDER_SITE_OTHER): Payer: 59 | Admitting: Family Medicine

## 2023-01-03 ENCOUNTER — Encounter (INDEPENDENT_AMBULATORY_CARE_PROVIDER_SITE_OTHER): Payer: Self-pay | Admitting: Family Medicine

## 2023-01-03 VITALS — BP 112/75 | HR 73 | Temp 98.2°F | Ht 63.0 in | Wt 186.0 lb

## 2023-01-03 DIAGNOSIS — E669 Obesity, unspecified: Secondary | ICD-10-CM

## 2023-01-03 DIAGNOSIS — R632 Polyphagia: Secondary | ICD-10-CM | POA: Diagnosis not present

## 2023-01-03 DIAGNOSIS — Z6832 Body mass index (BMI) 32.0-32.9, adult: Secondary | ICD-10-CM | POA: Diagnosis not present

## 2023-01-03 DIAGNOSIS — F3289 Other specified depressive episodes: Secondary | ICD-10-CM

## 2023-01-03 DIAGNOSIS — E559 Vitamin D deficiency, unspecified: Secondary | ICD-10-CM | POA: Diagnosis not present

## 2023-01-03 MED ORDER — VITAMIN D (ERGOCALCIFEROL) 1.25 MG (50000 UNIT) PO CAPS
50000.0000 [IU] | ORAL_CAPSULE | ORAL | 0 refills | Status: DC
Start: 2023-01-03 — End: 2023-03-21
  Filled 2023-01-03: qty 12, 84d supply, fill #0

## 2023-01-03 MED ORDER — ZEPBOUND 12.5 MG/0.5ML ~~LOC~~ SOAJ
12.5000 mg | SUBCUTANEOUS | 0 refills | Status: DC
Start: 2023-01-03 — End: 2023-03-21
  Filled 2023-01-03: qty 2, 28d supply, fill #0
  Filled 2023-02-05 – 2023-02-06 (×3): qty 2, 28d supply, fill #1
  Filled 2023-03-16: qty 2, 28d supply, fill #2

## 2023-01-03 MED ORDER — ESCITALOPRAM OXALATE 20 MG PO TABS
20.0000 mg | ORAL_TABLET | Freq: Every morning | ORAL | 0 refills | Status: DC
Start: 2023-01-03 — End: 2023-03-21
  Filled 2023-01-03: qty 90, 90d supply, fill #0

## 2023-01-03 NOTE — Progress Notes (Signed)
Chief Complaint:   OBESITY Katherine Smith is here to discuss her progress with her obesity treatment plan along with follow-up of her obesity related diagnoses. Katherine Smith is on the Category 1 Plan and states she is following her eating plan approximately 75% of the time. Katherine Smith states she is walking  20-30 minutes 7 times per week.  Today's visit was #: 69 Starting weight: 240 lbs Starting date: 08/10/2016 Today's weight: 186 lbs Today's date: 01/03/2023 Total lbs lost to date: 54 Total lbs lost since last in-office visit: 0  Interim History: [name] continues to work on her diet and exercise. She will be traveling to [place]. She has been walking more this Spring. She is retaining a bit of fluid.   Subjective:   1. Vitamin D deficiency Katherine Smith is on Vitamin D with no side effects noted. She has no signs of over-replacement.   2. Polyphagia Katherine Smith is doing well on Zepbound. She continues to work on her diet and exercise. She denies nausea or vomiting.   3. Emotional Eating Behavior Katherine Smith continues to work on strategies to decrease emotional eating behaviors. She is stable on her medications.   Assessment/Plan:   1. Vitamin D deficiency Katherine Smith will continue prescription Vitamin D, and we will refill for 90 days.   - Vitamin D, Ergocalciferol, (DRISDOL) 1.25 MG (50000 UNIT) CAPS capsule; Take 1 capsule (50,000 Units total) by mouth every 7 (seven) days.  Dispense: 12 capsule; Refill: 0  2. Polyphagia Katherine Smith will continue Zepbound, and we will refill for 90 days.   - tirzepatide (ZEPBOUND) 12.5 MG/0.5ML Pen; Inject 12.5 mg into the skin once a week.  Dispense: 6 mL; Refill: 0  3. Emotional Eating Behavior Katherine Smith will continue Lexapro, and we will refill for 90 days.   - escitalopram (LEXAPRO) 20 MG tablet; Take 1 tablet (20 mg total) by mouth every morning.  Dispense: 90 tablet; Refill: 0  4. BMI 32.0-32.9,adult  5. Obesity, Beginning BMI 42.51 Katherine Smith is currently in the action  stage of change. As such, her goal is to continue with weight loss efforts. She has agreed to the Category 1 Plan.   Exercise goals: As is.   Behavioral modification strategies: increasing lean protein intake.  Katherine Smith has agreed to follow-up with our clinic in 3 weeks. She was informed of the importance of frequent follow-up visits to maximize her success with intensive lifestyle modifications for her multiple health conditions.   Objective:   Blood pressure 112/75, pulse 73, temperature 98.2 F (36.8 C), height 5\' 3"  (1.6 m), weight 186 lb (84.4 kg), last menstrual period 04/14/2016, SpO2 97 %. Body mass index is 32.95 kg/m.  Lab Results  Component Value Date   CREATININE 0.93 07/07/2020   BUN 17 07/07/2020   NA 144 07/07/2020   K 4.1 07/07/2020   CL 104 07/07/2020   CO2 25 07/07/2020   Lab Results  Component Value Date   ALT 16 07/07/2020   AST 19 07/07/2020   ALKPHOS 88 07/07/2020   BILITOT 0.5 07/07/2020   Lab Results  Component Value Date   HGBA1C 5.2 07/07/2020   HGBA1C 4.9 02/25/2020   HGBA1C 5.2 08/05/2019   HGBA1C 5.1 11/23/2016   HGBA1C 5.3 08/10/2016   Lab Results  Component Value Date   INSULIN 13.9 07/07/2020   INSULIN 19.3 02/25/2020   INSULIN 16.3 08/05/2019   INSULIN 25.8 (H) 05/08/2019   INSULIN 46.2 (H) 10/02/2018   Lab Results  Component Value Date   TSH 1.340  07/07/2020   Lab Results  Component Value Date   CHOL 254 (H) 07/07/2020   HDL 60 07/07/2020   LDLCALC 174 (H) 07/07/2020   TRIG 115 07/07/2020   Lab Results  Component Value Date   VD25OH 44.0 07/07/2020   VD25OH 40.1 02/25/2020   VD25OH 37.6 08/05/2019   Lab Results  Component Value Date   WBC 5.5 07/07/2020   HGB 13.5 07/07/2020   HCT 41.1 07/07/2020   MCV 85 07/07/2020   PLT 297 07/07/2020   No results found for: "IRON", "TIBC", "FERRITIN"  Attestation Statements:   Reviewed by clinician on day of visit: allergies, medications, problem list, medical history,  surgical history, family history, social history, and previous encounter notes.   I, Burt Knack, am acting as transcriptionist for Quillian Quince, MD.  I have reviewed the above documentation for accuracy and completeness, and I agree with the above. -  Quillian Quince, MD

## 2023-01-09 ENCOUNTER — Other Ambulatory Visit: Payer: Self-pay

## 2023-01-24 ENCOUNTER — Encounter (INDEPENDENT_AMBULATORY_CARE_PROVIDER_SITE_OTHER): Payer: Self-pay | Admitting: Family Medicine

## 2023-01-24 ENCOUNTER — Other Ambulatory Visit: Payer: Self-pay

## 2023-01-24 ENCOUNTER — Ambulatory Visit (INDEPENDENT_AMBULATORY_CARE_PROVIDER_SITE_OTHER): Payer: 59 | Admitting: Family Medicine

## 2023-01-24 VITALS — BP 104/69 | HR 75 | Temp 97.5°F | Ht 63.0 in | Wt 184.0 lb

## 2023-01-24 DIAGNOSIS — Z6832 Body mass index (BMI) 32.0-32.9, adult: Secondary | ICD-10-CM

## 2023-01-24 DIAGNOSIS — D2262 Melanocytic nevi of left upper limb, including shoulder: Secondary | ICD-10-CM | POA: Diagnosis not present

## 2023-01-24 DIAGNOSIS — D2272 Melanocytic nevi of left lower limb, including hip: Secondary | ICD-10-CM | POA: Diagnosis not present

## 2023-01-24 DIAGNOSIS — L821 Other seborrheic keratosis: Secondary | ICD-10-CM | POA: Diagnosis not present

## 2023-01-24 DIAGNOSIS — L720 Epidermal cyst: Secondary | ICD-10-CM | POA: Diagnosis not present

## 2023-01-24 DIAGNOSIS — E669 Obesity, unspecified: Secondary | ICD-10-CM | POA: Diagnosis not present

## 2023-01-24 DIAGNOSIS — R7303 Prediabetes: Secondary | ICD-10-CM

## 2023-01-24 DIAGNOSIS — B078 Other viral warts: Secondary | ICD-10-CM | POA: Diagnosis not present

## 2023-01-24 DIAGNOSIS — E559 Vitamin D deficiency, unspecified: Secondary | ICD-10-CM

## 2023-01-24 DIAGNOSIS — D225 Melanocytic nevi of trunk: Secondary | ICD-10-CM | POA: Diagnosis not present

## 2023-01-24 DIAGNOSIS — D2261 Melanocytic nevi of right upper limb, including shoulder: Secondary | ICD-10-CM | POA: Diagnosis not present

## 2023-01-24 DIAGNOSIS — L900 Lichen sclerosus et atrophicus: Secondary | ICD-10-CM | POA: Diagnosis not present

## 2023-01-24 DIAGNOSIS — M71372 Other bursal cyst, left ankle and foot: Secondary | ICD-10-CM | POA: Diagnosis not present

## 2023-01-24 DIAGNOSIS — D2271 Melanocytic nevi of right lower limb, including hip: Secondary | ICD-10-CM | POA: Diagnosis not present

## 2023-01-24 MED ORDER — CLOBETASOL PROPIONATE 0.05 % EX OINT
1.0000 | TOPICAL_OINTMENT | Freq: Two times a day (BID) | CUTANEOUS | 2 refills | Status: AC
  Filled 2023-01-24: qty 30, 30d supply, fill #0
  Filled 2023-07-10: qty 30, 30d supply, fill #1
  Filled 2023-09-14: qty 30, 30d supply, fill #2

## 2023-01-24 NOTE — Progress Notes (Signed)
.smr  Office: [phone]  /  Fax: [phone]  WEIGHT SUMMARY AND BIOMETRICS  Anthropometric Measurements Height: 5\' 3"  (1.6 m) Weight: 184 lb (83.5 kg) BMI (Calculated): 32.6 Weight at Last Visit: 186 lb Weight Lost Since Last Visit: 2 lb Weight Gained Since Last Visit: 0 Total Weight Loss (lbs): 56 lb (25.4 kg)   Body Composition  Body Fat %: 40.5 % Fat Mass (lbs): 74.8 lbs Muscle Mass (lbs): 104.2 lbs Total Body Water (lbs): 70.8 lbs Visceral Fat Rating : 11   Other Clinical Data Fasting: No Labs: No Today's Visit #: 70  Discussed the use of AI scribe software for clinical note transcription with the patient, who gave verbal consent to proceed.  Chief Complaint: OBESITY  Katherine Smith is here to discuss her progress with her obesity treatment plan.  History of Present Illness   The patient, with a history of prediabetes and vitamin D deficiency, presents for a routine follow-up. She has been adhering to a category two diet plan, but struggles with meal preparation in the evenings due to fatigue after work. She often resorts to cereal for dinner, recognizing the need to remove it from her home to avoid this habit. Despite these challenges, she has lost two pounds.  She has been trying various meal options, including frozen meals and chicken cordon bleu, and is open to exploring other meal delivery services for convenience. She acknowledges the importance of protein intake, doing well with breakfast and lunch, but struggles with dinner.  The patient also enjoys physical activities such as biking and walking, especially during vacations. She is planning a trip to [place] where she intends to continue these activities. She has also found that engaging in puzzles in the evening helps deter snacking.  Her medication regimen includes Zepbound and prescription vitamin D. She has not reported any new symptoms or changes in her health status. Her most recent labs, done four months ago,  showed good control of cholesterol and triglycerides, and an A1c of 5.1. She is due for a vitamin D level check at her next visit.          PHYSICAL EXAM:  Blood pressure 104/69, pulse 75, temperature (!) 97.5 F (36.4 C), height 5\' 3"  (1.6 m), weight 184 lb (83.5 kg), last menstrual period 04/14/2016, SpO2 95 %. Body mass index is 32.59 kg/m.  DIAGNOSTIC DATA REVIEWED:  BMET    Component Value Date/Time   NA 144 07/07/2020 0946   K 4.1 07/07/2020 0946   CL 104 07/07/2020 0946   CO2 25 07/07/2020 0946   GLUCOSE 108 (H) 07/07/2020 0946   GLUCOSE 110 (H) 01/07/2019 0930   BUN 17 07/07/2020 0946   CREATININE 0.93 07/07/2020 0946   CALCIUM 9.4 07/07/2020 0946   GFRNONAA 67 07/07/2020 0946   GFRAA 77 07/07/2020 0946   Lab Results  Component Value Date   HGBA1C 5.2 07/07/2020   HGBA1C 5.3 08/10/2016   Lab Results  Component Value Date   INSULIN 13.9 07/07/2020   INSULIN 38.9 (H) 08/10/2016   Lab Results  Component Value Date   TSH 1.340 07/07/2020   CBC    Component Value Date/Time   WBC 5.5 07/07/2020 0946   WBC 8.8 01/07/2019 0930   RBC 4.83 07/07/2020 0946   RBC 4.54 01/07/2019 0930   HGB 13.5 07/07/2020 0946   HCT 41.1 07/07/2020 0946   PLT 297 07/07/2020 0946   MCV 85 07/07/2020 0946   MCH 28.0 07/07/2020 0946   MCH 28.2 01/07/2019  0930   MCHC 32.8 07/07/2020 0946   MCHC 32.9 01/07/2019 0930   RDW 13.3 07/07/2020 0946   Iron Studies No results found for: "IRON", "TIBC", "FERRITIN", "IRONPCTSAT" Lipid Panel     Component Value Date/Time   CHOL 254 (H) 07/07/2020 0946   TRIG 115 07/07/2020 0946   HDL 60 07/07/2020 0946   LDLCALC 174 (H) 07/07/2020 0946   Hepatic Function Panel     Component Value Date/Time   PROT 6.8 07/07/2020 0946   ALBUMIN 4.5 07/07/2020 0946   AST 19 07/07/2020 0946   ALT 16 07/07/2020 0946   ALKPHOS 88 07/07/2020 0946   BILITOT 0.5 07/07/2020 0946      Component Value Date/Time   TSH 1.340 07/07/2020 0946    Nutritional Lab Results  Component Value Date   VD25OH 44.0 07/07/2020   VD25OH 40.1 02/25/2020   VD25OH 37.6 08/05/2019     Assessment and Plan    Obesity: BMI of 32. Patient is on a category two plan and has lost 2 pounds. Struggles with meal preparation in the evening. Discussed strategies for meal planning and preparation, including meal delivery services and alternative lunch options. -Continue category two plan. -Consider alternative meal options and strategies to ensure adequate protein intake.  Prediabetes: Patient is controlled on Zepbound. Last A1C was 5.1 last year. -Continue Zepbound. -Check A1C at next visit.  Vitamin D deficiency: Patient is on prescription Vitamin D. No recent Vitamin D level checked. -Check Vitamin D level at next visit.  Follow-up: Next appointment already scheduled in three weeks. Plan to check labs at that visit.         She was informed of the importance of frequent follow up visits to maximize her success with intensive lifestyle modifications for her multiple health conditions. Return in about 4 weeks (around 02/21/2023).   Quillian Quince, MD

## 2023-02-05 ENCOUNTER — Other Ambulatory Visit: Payer: Self-pay

## 2023-02-06 ENCOUNTER — Telehealth (INDEPENDENT_AMBULATORY_CARE_PROVIDER_SITE_OTHER): Payer: Self-pay

## 2023-02-06 ENCOUNTER — Other Ambulatory Visit: Payer: Self-pay

## 2023-02-06 NOTE — Telephone Encounter (Signed)
Prior authorization submitted for Zepbound.  Message from insurance plan. Information regarding your request This drug/product is not covered under the pharmacy benefit. Prior Authorization is not available.

## 2023-02-13 ENCOUNTER — Other Ambulatory Visit: Payer: Self-pay

## 2023-02-14 ENCOUNTER — Other Ambulatory Visit: Payer: Self-pay

## 2023-02-14 ENCOUNTER — Ambulatory Visit (INDEPENDENT_AMBULATORY_CARE_PROVIDER_SITE_OTHER): Payer: 59 | Admitting: Family Medicine

## 2023-02-14 ENCOUNTER — Encounter (INDEPENDENT_AMBULATORY_CARE_PROVIDER_SITE_OTHER): Payer: Self-pay | Admitting: Family Medicine

## 2023-02-14 VITALS — BP 121/78 | HR 70 | Temp 97.8°F | Ht 63.0 in | Wt 184.0 lb

## 2023-02-14 DIAGNOSIS — E669 Obesity, unspecified: Secondary | ICD-10-CM

## 2023-02-14 DIAGNOSIS — Z6832 Body mass index (BMI) 32.0-32.9, adult: Secondary | ICD-10-CM | POA: Diagnosis not present

## 2023-02-14 DIAGNOSIS — F439 Reaction to severe stress, unspecified: Secondary | ICD-10-CM | POA: Diagnosis not present

## 2023-02-14 DIAGNOSIS — R632 Polyphagia: Secondary | ICD-10-CM

## 2023-02-14 MED ORDER — LEVOTHYROXINE SODIUM 112 MCG PO TABS
112.0000 ug | ORAL_TABLET | Freq: Every day | ORAL | 1 refills | Status: DC
  Filled 2023-02-14: qty 90, 90d supply, fill #0
  Filled 2023-05-14: qty 90, 90d supply, fill #1

## 2023-02-14 NOTE — Progress Notes (Signed)
Chief Complaint:   OBESITY Katherine Smith is here to discuss her progress with her obesity treatment plan along with follow-up of her obesity related diagnoses. Katherine Smith is on the Category 2 Plan and states she is following her eating plan approximately 75% of the time. Katherine Smith states she is walking for 30-40 minutes 1-2 times per week.  Today's visit was #: 71 Starting weight: 240 lbs Starting date: 08/10/2016 Today's weight: 184 lbs Today's date: 02/14/2023 Total lbs lost to date: 56 Total lbs lost since last in-office visit: 0  Interim History: Patient has done well with maintaining her weight loss despite extra challenges.  She is active at work but she struggles to exercise on her days off.  Subjective:   1. Stress Patient's dog was recently diagnosed with cancer and she is naturally stressed and distressed.  She feels that there may be increased comfort snacking recently, but she is still mindful of her food choices.  2. Polyphagia Patient is on Zepbound and she is doing well with maintaining her weight loss.  She feels she may have done better with [facility] and she has an extra prescription for this.  Assessment/Plan:   1. Stress Emotional eating behavior strategies were discussed, and we will continue to monitor closely.  2. Polyphagia Patient is okay to change to [facility], and we will follow-up in 1 month to see if she can still notice a difference.  3. BMI 32.0-32.9,adult  4. Obesity, Beginning BMI 42.51 Orbie is currently in the action stage of change. As such, her goal is to continue with weight loss efforts. She has agreed to the Category 2 Plan.   Exercise goals: All adults should avoid inactivity. Some physical activity is better than none, and adults who participate in any amount of physical activity gain some health benefits.  Behavioral modification strategies: increasing lean protein intake, ways to avoid night time snacking, and emotional eating strategies.  Katherine Smith  has agreed to follow-up with our clinic in 4 weeks. She was informed of the importance of frequent follow-up visits to maximize her success with intensive lifestyle modifications for her multiple health conditions.   Objective:   Blood pressure 121/78, pulse 70, temperature 97.8 F (36.6 C), height 5\' 3"  (1.6 m), weight 184 lb (83.5 kg), last menstrual period 04/14/2016, SpO2 99 %. Body mass index is 32.59 kg/m.  Lab Results  Component Value Date   CREATININE 0.93 07/07/2020   BUN 17 07/07/2020   NA 144 07/07/2020   K 4.1 07/07/2020   CL 104 07/07/2020   CO2 25 07/07/2020   Lab Results  Component Value Date   ALT 16 07/07/2020   AST 19 07/07/2020   ALKPHOS 88 07/07/2020   BILITOT 0.5 07/07/2020   Lab Results  Component Value Date   HGBA1C 5.2 07/07/2020   HGBA1C 4.9 02/25/2020   HGBA1C 5.2 08/05/2019   HGBA1C 5.1 11/23/2016   HGBA1C 5.3 08/10/2016   Lab Results  Component Value Date   INSULIN 13.9 07/07/2020   INSULIN 19.3 02/25/2020   INSULIN 16.3 08/05/2019   INSULIN 25.8 (H) 05/08/2019   INSULIN 46.2 (H) 10/02/2018   Lab Results  Component Value Date   TSH 1.340 07/07/2020   Lab Results  Component Value Date   CHOL 254 (H) 07/07/2020   HDL 60 07/07/2020   LDLCALC 174 (H) 07/07/2020   TRIG 115 07/07/2020   Lab Results  Component Value Date   VD25OH 44.0 07/07/2020   VD25OH 40.1 02/25/2020  VD25OH 37.6 08/05/2019   Lab Results  Component Value Date   WBC 5.5 07/07/2020   HGB 13.5 07/07/2020   HCT 41.1 07/07/2020   MCV 85 07/07/2020   PLT 297 07/07/2020   No results found for: "IRON", "TIBC", "FERRITIN"  Attestation Statements:   Reviewed by clinician on day of visit: allergies, medications, problem list, medical history, surgical history, family history, social history, and previous encounter notes.  Time spent on visit including pre-visit chart review and post-visit care and charting was 30 minutes.   I, Burt Knack, am acting as  transcriptionist for Quillian Quince, MD.  I have reviewed the above documentation for accuracy and completeness, and I agree with the above. -  Quillian Quince, MD

## 2023-02-16 ENCOUNTER — Other Ambulatory Visit: Payer: Self-pay

## 2023-02-21 ENCOUNTER — Other Ambulatory Visit: Payer: Self-pay | Admitting: Oncology

## 2023-02-21 DIAGNOSIS — Z006 Encounter for examination for normal comparison and control in clinical research program: Secondary | ICD-10-CM

## 2023-03-02 ENCOUNTER — Other Ambulatory Visit: Payer: Self-pay

## 2023-03-14 ENCOUNTER — Ambulatory Visit (INDEPENDENT_AMBULATORY_CARE_PROVIDER_SITE_OTHER): Payer: 59 | Admitting: Family Medicine

## 2023-03-16 ENCOUNTER — Other Ambulatory Visit: Payer: Self-pay

## 2023-03-21 ENCOUNTER — Encounter (INDEPENDENT_AMBULATORY_CARE_PROVIDER_SITE_OTHER): Payer: Self-pay | Admitting: Family Medicine

## 2023-03-21 ENCOUNTER — Ambulatory Visit (INDEPENDENT_AMBULATORY_CARE_PROVIDER_SITE_OTHER): Payer: 59 | Admitting: Family Medicine

## 2023-03-21 ENCOUNTER — Other Ambulatory Visit: Payer: Self-pay

## 2023-03-21 VITALS — BP 123/76 | HR 77 | Temp 98.0°F | Ht 63.0 in | Wt 186.0 lb

## 2023-03-21 DIAGNOSIS — E6609 Other obesity due to excess calories: Secondary | ICD-10-CM | POA: Diagnosis not present

## 2023-03-21 DIAGNOSIS — E782 Mixed hyperlipidemia: Secondary | ICD-10-CM | POA: Diagnosis not present

## 2023-03-21 DIAGNOSIS — Z6832 Body mass index (BMI) 32.0-32.9, adult: Secondary | ICD-10-CM | POA: Diagnosis not present

## 2023-03-21 DIAGNOSIS — E669 Obesity, unspecified: Secondary | ICD-10-CM | POA: Diagnosis not present

## 2023-03-21 DIAGNOSIS — Z6833 Body mass index (BMI) 33.0-33.9, adult: Secondary | ICD-10-CM

## 2023-03-21 DIAGNOSIS — E559 Vitamin D deficiency, unspecified: Secondary | ICD-10-CM

## 2023-03-21 DIAGNOSIS — F3289 Other specified depressive episodes: Secondary | ICD-10-CM | POA: Diagnosis not present

## 2023-03-21 MED ORDER — ZEPBOUND 12.5 MG/0.5ML ~~LOC~~ SOAJ
12.5000 mg | SUBCUTANEOUS | 0 refills | Status: AC
Start: 2023-03-21 — End: ?
  Filled 2023-03-21 – 2023-04-24 (×2): qty 2, 28d supply, fill #0

## 2023-03-21 MED ORDER — ESCITALOPRAM OXALATE 20 MG PO TABS
20.0000 mg | ORAL_TABLET | Freq: Every morning | ORAL | 0 refills | Status: AC
Start: 2023-03-21 — End: ?
  Filled 2023-03-21: qty 90, 90d supply, fill #0

## 2023-03-21 MED ORDER — VITAMIN D (ERGOCALCIFEROL) 1.25 MG (50000 UNIT) PO CAPS
50000.0000 [IU] | ORAL_CAPSULE | ORAL | 0 refills | Status: AC
Start: 2023-03-21 — End: ?
  Filled 2023-03-21: qty 12, 84d supply, fill #0

## 2023-03-21 NOTE — Progress Notes (Unsigned)
Chief Complaint:   OBESITY Katherine Smith is here to discuss her progress with her obesity treatment plan along with follow-up of her obesity related diagnoses. Katherine Smith is on the Category 2 Plan and states she is following her eating plan approximately 50% of the time. Katherine Smith states she is walking for 30 minutes 2 times per week.  Today's visit was #: 72 Starting weight: 240 lbs Starting date: 08/10/2016 Today's weight: 186 lbs Today's date: 03/21/2023 Total lbs lost to date: 54 Total lbs lost since last in-office visit: 0  Interim History: Patient has struggled more with meal planning with increased work stress.  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  Subjective:   1. Vitamin D deficiency Patient is on vitamin D but her level is not yet at goal.  No side effects were noted.  2. Emotional Eating Behavior Patient is doing well with decreasing emotional eating behaviors and cravings.  Assessment/Plan:   1. Vitamin D deficiency Patient will continue prescription vitamin D once weekly, and we will refill for 90 days.  - Vitamin D, Ergocalciferol, (DRISDOL) 1.25 MG (50000 UNIT) CAPS capsule; Take 1 capsule (50,000 Units total) by mouth every 7 (seven) days.  Dispense: 12 capsule; Refill: 0  2. Emotional Eating Behavior Patient will continue Lexapro 20 mg once daily, and we will refill for 90 days.  - escitalopram (LEXAPRO) 20 MG tablet; Take 1 tablet (20 mg total) by mouth every morning.  Dispense: 90 tablet; Refill: 0  3. BMI 33.0-33.9,adult  4. Obesity, Beginning BMI 42.51 Patient will continue Zepbound, and we will refill for 1 month.  - tirzepatide (ZEPBOUND) 12.5 MG/0.5ML Pen; Inject 12.5 mg into the skin once a week.  Dispense: 6 mL; Refill: 0  Katherine Smith is currently in the action stage of change. As such, her goal is to continue with weight loss efforts. She has agreed to the Category 2 Plan.   Patient is to work on Scientist, research (medical).  Exercise goals: As  is.  Behavioral modification strategies: increasing lean protein intake and meal planning and cooking strategies.  Katherine Smith has agreed to follow-up with our clinic in 4 weeks. She was informed of the importance of frequent follow-up visits to maximize her success with intensive lifestyle modifications for her multiple health conditions.   Objective:   Blood pressure 123/76, pulse 77, temperature 98 F (36.7 C), height 5\' 3"  (1.6 m), weight 186 lb (84.4 kg), last menstrual period 04/14/2016, SpO2 96%. Body mass index is 32.95 kg/m.  Lab Results  Component Value Date   CREATININE 0.93 07/07/2020   BUN 17 07/07/2020   NA 144 07/07/2020   K 4.1 07/07/2020   CL 104 07/07/2020   CO2 25 07/07/2020   Lab Results  Component Value Date   ALT 16 07/07/2020   AST 19 07/07/2020   ALKPHOS 88 07/07/2020   BILITOT 0.5 07/07/2020   Lab Results  Component Value Date   HGBA1C 5.2 07/07/2020   HGBA1C 4.9 02/25/2020   HGBA1C 5.2 08/05/2019   HGBA1C 5.1 11/23/2016   HGBA1C 5.3 08/10/2016   Lab Results  Component Value Date   INSULIN 13.9 07/07/2020   INSULIN 19.3 02/25/2020   INSULIN 16.3 08/05/2019   INSULIN 25.8 (H) 05/08/2019   INSULIN 46.2 (H) 10/02/2018   Lab Results  Component Value Date   TSH 1.340 07/07/2020   Lab Results  Component Value Date   CHOL 254 (H) 07/07/2020   HDL 60 07/07/2020   LDLCALC 174 (H) 07/07/2020  TRIG 115 07/07/2020   Lab Results  Component Value Date   VD25OH 44.0 07/07/2020   VD25OH 40.1 02/25/2020   VD25OH 37.6 08/05/2019   Lab Results  Component Value Date   WBC 5.5 07/07/2020   HGB 13.5 07/07/2020   HCT 41.1 07/07/2020   MCV 85 07/07/2020   PLT 297 07/07/2020   No results found for: "IRON", "TIBC", "FERRITIN"  Attestation Statements:   Reviewed by clinician on day of visit: allergies, medications, problem list, medical history, surgical history, family history, social history, and previous encounter notes.   I, Burt Knack, am  acting as transcriptionist for Quillian Quince, MD.  I have reviewed the above documentation for accuracy and completeness, and I agree with the above. -  Quillian Quince, MD

## 2023-03-27 ENCOUNTER — Other Ambulatory Visit: Payer: Self-pay

## 2023-03-27 DIAGNOSIS — N898 Other specified noninflammatory disorders of vagina: Secondary | ICD-10-CM | POA: Diagnosis not present

## 2023-03-27 DIAGNOSIS — N958 Other specified menopausal and perimenopausal disorders: Secondary | ICD-10-CM | POA: Diagnosis not present

## 2023-03-27 DIAGNOSIS — Z01411 Encounter for gynecological examination (general) (routine) with abnormal findings: Secondary | ICD-10-CM | POA: Diagnosis not present

## 2023-03-27 DIAGNOSIS — L900 Lichen sclerosus et atrophicus: Secondary | ICD-10-CM | POA: Diagnosis not present

## 2023-03-27 MED ORDER — ESTRING 7.5 MCG/24HR VA RING
1.0000 | VAGINAL_RING | VAGINAL | 3 refills | Status: DC
  Filled 2023-03-27 (×2): qty 1, 90d supply, fill #0

## 2023-03-28 ENCOUNTER — Other Ambulatory Visit
Admission: RE | Admit: 2023-03-28 | Discharge: 2023-03-28 | Disposition: A | Discharge status: Home / Self Care | Payer: 59 | Attending: Oncology | Admitting: Oncology

## 2023-03-28 ENCOUNTER — Other Ambulatory Visit: Payer: Self-pay

## 2023-03-28 DIAGNOSIS — Z0000 Encounter for general adult medical examination without abnormal findings: Secondary | ICD-10-CM | POA: Diagnosis not present

## 2023-03-28 DIAGNOSIS — E782 Mixed hyperlipidemia: Secondary | ICD-10-CM | POA: Diagnosis not present

## 2023-03-28 DIAGNOSIS — Z006 Encounter for examination for normal comparison and control in clinical research program: Secondary | ICD-10-CM | POA: Insufficient documentation

## 2023-03-28 DIAGNOSIS — E039 Hypothyroidism, unspecified: Secondary | ICD-10-CM | POA: Diagnosis not present

## 2023-03-28 DIAGNOSIS — E6609 Other obesity due to excess calories: Secondary | ICD-10-CM | POA: Diagnosis not present

## 2023-03-28 DIAGNOSIS — Z6833 Body mass index (BMI) 33.0-33.9, adult: Secondary | ICD-10-CM | POA: Diagnosis not present

## 2023-03-28 MED ORDER — ESTRADIOL 0.1 MG/GM VA CREA
1.0000 g | TOPICAL_CREAM | Freq: Every day | VAGINAL | 2 refills | Status: AC
  Filled 2023-03-28: qty 42.5, 90d supply, fill #0
  Filled 2023-07-10: qty 42.5, 90d supply, fill #1
  Filled 2023-12-30: qty 42.5, 90d supply, fill #2

## 2023-03-29 ENCOUNTER — Other Ambulatory Visit: Payer: Self-pay

## 2023-04-10 LAB — GENECONNECT MOLECULAR SCREEN: Genetic Analysis Overall Interpretation: NEGATIVE

## 2023-04-12 ENCOUNTER — Other Ambulatory Visit: Payer: Self-pay

## 2023-04-12 DIAGNOSIS — S92912A Unspecified fracture of left toe(s), initial encounter for closed fracture: Secondary | ICD-10-CM | POA: Diagnosis not present

## 2023-04-12 DIAGNOSIS — S46011A Strain of muscle(s) and tendon(s) of the rotator cuff of right shoulder, initial encounter: Secondary | ICD-10-CM | POA: Diagnosis not present

## 2023-04-12 MED ORDER — MELOXICAM 15 MG PO TABS
15.0000 mg | ORAL_TABLET | Freq: Every day | ORAL | 0 refills | Status: DC
  Filled 2023-04-12: qty 90, 90d supply, fill #0

## 2023-04-17 DIAGNOSIS — S8002XA Contusion of left knee, initial encounter: Secondary | ICD-10-CM | POA: Diagnosis not present

## 2023-04-18 ENCOUNTER — Encounter (INDEPENDENT_AMBULATORY_CARE_PROVIDER_SITE_OTHER): Payer: Self-pay | Admitting: Family Medicine

## 2023-04-18 ENCOUNTER — Ambulatory Visit (INDEPENDENT_AMBULATORY_CARE_PROVIDER_SITE_OTHER): Payer: 59 | Admitting: Family Medicine

## 2023-04-18 VITALS — BP 107/70 | HR 75 | Temp 98.1°F | Ht 63.0 in | Wt 185.0 lb

## 2023-04-18 DIAGNOSIS — Z6832 Body mass index (BMI) 32.0-32.9, adult: Secondary | ICD-10-CM | POA: Diagnosis not present

## 2023-04-18 DIAGNOSIS — E88819 Insulin resistance, unspecified: Secondary | ICD-10-CM

## 2023-04-18 DIAGNOSIS — E669 Obesity, unspecified: Secondary | ICD-10-CM | POA: Diagnosis not present

## 2023-04-18 DIAGNOSIS — E559 Vitamin D deficiency, unspecified: Secondary | ICD-10-CM

## 2023-04-18 NOTE — Progress Notes (Unsigned)
Chief Complaint:   OBESITY Katherine Smith is here to discuss her progress with her obesity treatment plan along with follow-up of her obesity related diagnoses. Mirari is on the Category 2 Plan and states she is following her eating plan approximately 75% of the time. Ajayla states she is doing 0 minutes 0 times per week.  Today's visit was #: 73 Starting weight: 240 lbs Starting date: 08/10/2016 Today's weight: 185 lbs Today's date: 04/18/2023 Total lbs lost to date: 55 Total lbs lost since last in-office visit: 1  Interim History: Patient continues to do well with her weight loss.  She is doing well with her diet, but her exercise is limited due to recovering from from a recent fall.  Subjective:   1. Vitamin D deficiency Patient is due for labs.  She is on vitamin D prescription.  She denies nausea, vomiting, or muscle weakness.  2. Insulin resistance Patient's recent A1c was 4.9.  She is stable on Zepbound with no side effects noted.  Assessment/Plan:   1. Vitamin D deficiency We will check labs today, and we will follow-up at patient's next visit.  - VITAMIN D 25 Hydroxy (Vit-D Deficiency, Fractures)  2. Insulin resistance Patient will continue Zepbound at 12.5 mg once weekly, and we will continue to monitor.  3. BMI 32.0-32.9,adult  4. Obesity, Beginning BMI 42.51 Katherine Smith is currently in the action stage of change. As such, her goal is to continue with weight loss efforts. She has agreed to the Category 2 Plan.   Exercise goals: As tolerated for now.   Behavioral modification strategies: increasing lean protein intake.  Katherine Smith has agreed to follow-up with our clinic in 4 weeks. She was informed of the importance of frequent follow-up visits to maximize her success with intensive lifestyle modifications for her multiple health conditions.   Katherine Smith was informed we would discuss her lab results at her next visit unless there is a critical issue that needs to be addressed  sooner. Katherine Smith agreed to keep her next visit at the agreed upon time to discuss these results.  Objective:   Blood pressure 107/70, pulse 75, temperature 98.1 F (36.7 C), height 5\' 3"  (1.6 m), weight 185 lb (83.9 kg), last menstrual period 04/14/2016, SpO2 96%. Body mass index is 32.77 kg/m.  Lab Results  Component Value Date   CREATININE 0.93 07/07/2020   BUN 17 07/07/2020   NA 144 07/07/2020   K 4.1 07/07/2020   CL 104 07/07/2020   CO2 25 07/07/2020   Lab Results  Component Value Date   ALT 16 07/07/2020   AST 19 07/07/2020   ALKPHOS 88 07/07/2020   BILITOT 0.5 07/07/2020   Lab Results  Component Value Date   HGBA1C 5.2 07/07/2020   HGBA1C 4.9 02/25/2020   HGBA1C 5.2 08/05/2019   HGBA1C 5.1 11/23/2016   HGBA1C 5.3 08/10/2016   Lab Results  Component Value Date   INSULIN 13.9 07/07/2020   INSULIN 19.3 02/25/2020   INSULIN 16.3 08/05/2019   INSULIN 25.8 (H) 05/08/2019   INSULIN 46.2 (H) 10/02/2018   Lab Results  Component Value Date   TSH 1.340 07/07/2020   Lab Results  Component Value Date   CHOL 254 (H) 07/07/2020   HDL 60 07/07/2020   LDLCALC 174 (H) 07/07/2020   TRIG 115 07/07/2020   Lab Results  Component Value Date   VD25OH 44.0 07/07/2020   VD25OH 40.1 02/25/2020   VD25OH 37.6 08/05/2019   Lab Results  Component Value Date  WBC 5.5 07/07/2020   HGB 13.5 07/07/2020   HCT 41.1 07/07/2020   MCV 85 07/07/2020   PLT 297 07/07/2020   No results found for: "IRON", "TIBC", "FERRITIN"  Attestation Statements:   Reviewed by clinician on day of visit: allergies, medications, problem list, medical history, surgical history, family history, social history, and previous encounter notes.   I, Burt Knack, am acting as transcriptionist for Quillian Quince, MD.  I have reviewed the above documentation for accuracy and completeness, and I agree with the above. -  Quillian Quince, MD

## 2023-04-19 LAB — VITAMIN D 25 HYDROXY (VIT D DEFICIENCY, FRACTURES): Vit D, 25-Hydroxy: 51.6 ng/mL (ref 30.0–100.0)

## 2023-04-24 ENCOUNTER — Other Ambulatory Visit: Payer: Self-pay

## 2023-05-11 ENCOUNTER — Other Ambulatory Visit: Payer: Self-pay

## 2023-05-11 MED ORDER — FLULAVAL 0.5 ML IM SUSY
PREFILLED_SYRINGE | INTRAMUSCULAR | 0 refills | Status: DC
  Filled 2023-05-11: qty 0.5, 1d supply, fill #0

## 2023-05-11 MED ORDER — COMIRNATY 30 MCG/0.3ML IM SUSY
PREFILLED_SYRINGE | INTRAMUSCULAR | 0 refills | Status: DC
  Filled 2023-05-11: qty 0.3, 1d supply, fill #0

## 2023-05-14 ENCOUNTER — Other Ambulatory Visit: Payer: Self-pay

## 2023-05-15 ENCOUNTER — Other Ambulatory Visit: Payer: Self-pay

## 2023-05-15 MED ORDER — EZETIMIBE 10 MG PO TABS
10.0000 mg | ORAL_TABLET | Freq: Every day | ORAL | 1 refills | Status: DC
  Filled 2023-05-15: qty 90, 90d supply, fill #0

## 2023-05-16 ENCOUNTER — Encounter (INDEPENDENT_AMBULATORY_CARE_PROVIDER_SITE_OTHER): Payer: Self-pay | Admitting: Family Medicine

## 2023-05-16 ENCOUNTER — Other Ambulatory Visit: Payer: Self-pay

## 2023-05-16 ENCOUNTER — Ambulatory Visit (INDEPENDENT_AMBULATORY_CARE_PROVIDER_SITE_OTHER): Payer: 59 | Admitting: Family Medicine

## 2023-05-16 VITALS — BP 104/72 | HR 75 | Temp 97.9°F | Ht 63.0 in | Wt 184.0 lb

## 2023-05-16 DIAGNOSIS — E559 Vitamin D deficiency, unspecified: Secondary | ICD-10-CM

## 2023-05-16 DIAGNOSIS — E669 Obesity, unspecified: Secondary | ICD-10-CM | POA: Diagnosis not present

## 2023-05-16 DIAGNOSIS — Z6832 Body mass index (BMI) 32.0-32.9, adult: Secondary | ICD-10-CM | POA: Diagnosis not present

## 2023-05-16 DIAGNOSIS — F5089 Other specified eating disorder: Secondary | ICD-10-CM

## 2023-05-16 DIAGNOSIS — F3289 Other specified depressive episodes: Secondary | ICD-10-CM

## 2023-05-16 MED ORDER — VITAMIN D (ERGOCALCIFEROL) 1.25 MG (50000 UNIT) PO CAPS
50000.0000 [IU] | ORAL_CAPSULE | ORAL | 0 refills | Status: DC
  Filled 2023-05-16 – 2023-07-10 (×2): qty 12, 84d supply, fill #0

## 2023-05-16 MED ORDER — ESCITALOPRAM OXALATE 20 MG PO TABS
20.0000 mg | ORAL_TABLET | Freq: Every morning | ORAL | 0 refills | Status: DC
  Filled 2023-05-16: qty 90, 90d supply, fill #0

## 2023-05-16 MED ORDER — ZEPBOUND 12.5 MG/0.5ML ~~LOC~~ SOAJ
12.5000 mg | SUBCUTANEOUS | 0 refills | Status: DC
  Filled 2023-05-16 – 2023-06-12 (×2): qty 2, 28d supply, fill #0

## 2023-05-16 NOTE — Progress Notes (Unsigned)
Chief Complaint:   OBESITY Katherine Smith is here to discuss her progress with her obesity treatment plan along with follow-up of her obesity related diagnoses. Katherine Smith is on the Category 2 Plan and states she is following her eating plan approximately 75% of the time. Katherine Smith states she is doing 0 minutes 0 times per week.  Today's visit was #: 74 Starting weight: 240 lbs Starting date: 08/10/2016 Today's weight: 184 lbs Today's date: 05/16/2023 Total lbs lost to date: 56 Total lbs lost since last in-office visit: 1  Interim History: Patient is doing better with following her eating plan.  Her hunger is controlled but she still struggles with some cravings.  She is making strategies to do better and help decrease eating out and snacking.  Subjective:   1. Vitamin D deficiency Patient is stable on Vitamin D, and her recent Vitamin D level was at goal. No side effects were noted.   2. Emotional Eating Behavior Patient notes she is still struggling with some emotional eating behavior, but she is making some good strategies to help decrease emotional eating behavior.  Assessment/Plan:   1. Vitamin D deficiency Patient will continue once weekly prescription vitamin D 50,000 IU, and we will refill for 90 days.  - Vitamin D, Ergocalciferol, (DRISDOL) 1.25 MG (50000 UNIT) CAPS capsule; Take 1 capsule (50,000 Units total) by mouth every 7 (seven) days.  Dispense: 12 capsule; Refill: 0  2. Emotional Eating Behavior Patient will continue Lexapro 20 mg every morning, and we will refill for 90 days.  - escitalopram (LEXAPRO) 20 MG tablet; Take 1 tablet (20 mg total) by mouth every morning.  Dispense: 90 tablet; Refill: 0  3. BMI 32.0-32.9,adult  4. Obesity, Beginning BMI 42.51 Patient will continue Zepbound 12.5 mg once weekly, and we will refill 90 days.   - tirzepatide (ZEPBOUND) 12.5 MG/0.5ML Pen; Inject 12.5 mg into the skin once a week.  Dispense: 6 mL; Refill: 0  Katherine Smith is currently  in the action stage of change. As such, her goal is to continue with weight loss efforts. She has agreed to the Category 2 Plan.   Behavioral modification strategies: increasing lean protein intake.  Katherine Smith has agreed to follow-up with our clinic in 4 weeks. She was informed of the importance of frequent follow-up visits to maximize her success with intensive lifestyle modifications for her multiple health conditions.   Objective:   Blood pressure 104/72, pulse 75, temperature 97.9 F (36.6 C), height 5\' 3"  (1.6 m), weight 184 lb (83.5 kg), last menstrual period 04/14/2016, SpO2 98%. Body mass index is 32.59 kg/m.  Lab Results  Component Value Date   CREATININE 0.93 07/07/2020   BUN 17 07/07/2020   NA 144 07/07/2020   K 4.1 07/07/2020   CL 104 07/07/2020   CO2 25 07/07/2020   Lab Results  Component Value Date   ALT 16 07/07/2020   AST 19 07/07/2020   ALKPHOS 88 07/07/2020   BILITOT 0.5 07/07/2020   Lab Results  Component Value Date   HGBA1C 5.2 07/07/2020   HGBA1C 4.9 02/25/2020   HGBA1C 5.2 08/05/2019   HGBA1C 5.1 11/23/2016   HGBA1C 5.3 08/10/2016   Lab Results  Component Value Date   INSULIN 13.9 07/07/2020   INSULIN 19.3 02/25/2020   INSULIN 16.3 08/05/2019   INSULIN 25.8 (H) 05/08/2019   INSULIN 46.2 (H) 10/02/2018   Lab Results  Component Value Date   TSH 1.340 07/07/2020   Lab Results  Component Value Date  CHOL 254 (H) 07/07/2020   HDL 60 07/07/2020   LDLCALC 174 (H) 07/07/2020   TRIG 115 07/07/2020   Lab Results  Component Value Date   VD25OH 51.6 04/18/2023   VD25OH 44.0 07/07/2020   VD25OH 40.1 02/25/2020   Lab Results  Component Value Date   WBC 5.5 07/07/2020   HGB 13.5 07/07/2020   HCT 41.1 07/07/2020   MCV 85 07/07/2020   PLT 297 07/07/2020   No results found for: "IRON", "TIBC", "FERRITIN"  Attestation Statements:   Reviewed by clinician on day of visit: allergies, medications, problem list, medical history, surgical history,  family history, social history, and previous encounter notes.   I, Burt Knack, am acting as transcriptionist for Quillian Quince, MD.  I have reviewed the above documentation for accuracy and completeness, and I agree with the above. -  Quillian Quince, MD

## 2023-05-29 ENCOUNTER — Other Ambulatory Visit: Payer: Self-pay

## 2023-06-12 ENCOUNTER — Other Ambulatory Visit: Payer: Self-pay

## 2023-06-13 ENCOUNTER — Ambulatory Visit (INDEPENDENT_AMBULATORY_CARE_PROVIDER_SITE_OTHER): Payer: 59 | Admitting: Family Medicine

## 2023-06-13 ENCOUNTER — Other Ambulatory Visit: Payer: Self-pay

## 2023-06-13 MED ORDER — EZETIMIBE 10 MG PO TABS
10.0000 mg | ORAL_TABLET | Freq: Every day | ORAL | 1 refills | Status: DC
  Filled 2023-06-13: qty 90, 90d supply, fill #0
  Filled 2023-09-14: qty 90, 90d supply, fill #1
  Filled 2023-12-30: qty 90, 90d supply, fill #2

## 2023-06-14 ENCOUNTER — Other Ambulatory Visit: Payer: Self-pay

## 2023-06-14 MED ORDER — PANTOPRAZOLE SODIUM 40 MG PO TBEC
40.0000 mg | DELAYED_RELEASE_TABLET | Freq: Every day | ORAL | 1 refills | Status: DC
  Filled 2023-06-14: qty 90, 90d supply, fill #0
  Filled 2023-09-14: qty 90, 90d supply, fill #1
  Filled 2023-12-03: qty 20, 20d supply, fill #2
  Filled 2023-12-09: qty 90, 90d supply, fill #2

## 2023-06-27 ENCOUNTER — Ambulatory Visit (INDEPENDENT_AMBULATORY_CARE_PROVIDER_SITE_OTHER): Payer: 59 | Admitting: Family Medicine

## 2023-06-27 ENCOUNTER — Other Ambulatory Visit: Payer: Self-pay

## 2023-06-27 ENCOUNTER — Encounter (INDEPENDENT_AMBULATORY_CARE_PROVIDER_SITE_OTHER): Payer: Self-pay | Admitting: Family Medicine

## 2023-06-27 VITALS — BP 120/73 | HR 74 | Temp 97.4°F | Ht 63.0 in | Wt 188.0 lb

## 2023-06-27 DIAGNOSIS — E669 Obesity, unspecified: Secondary | ICD-10-CM | POA: Diagnosis not present

## 2023-06-27 DIAGNOSIS — F3289 Other specified depressive episodes: Secondary | ICD-10-CM

## 2023-06-27 DIAGNOSIS — Z6833 Body mass index (BMI) 33.0-33.9, adult: Secondary | ICD-10-CM

## 2023-06-27 DIAGNOSIS — R632 Polyphagia: Secondary | ICD-10-CM | POA: Diagnosis not present

## 2023-06-27 MED ORDER — ZEPBOUND 12.5 MG/0.5ML ~~LOC~~ SOAJ
12.5000 mg | SUBCUTANEOUS | 0 refills | Status: DC
  Filled 2023-06-27: qty 6, 84d supply, fill #0
  Filled 2023-07-10: qty 2, 28d supply, fill #0

## 2023-06-27 MED ORDER — ESCITALOPRAM OXALATE 20 MG PO TABS
20.0000 mg | ORAL_TABLET | Freq: Every morning | ORAL | 0 refills | Status: DC
  Filled 2023-06-27: qty 90, 90d supply, fill #0

## 2023-06-27 NOTE — Progress Notes (Signed)
.smr  Office: [phone]  /  Fax: [phone]  WEIGHT SUMMARY AND BIOMETRICS  Anthropometric Measurements Height: 5\' 3"  (1.6 m) Weight: 188 lb (85.3 kg) BMI (Calculated): 33.31 Weight at Last Visit: 184 lb Weight Lost Since Last Visit: 0 Weight Gained Since Last Visit: 4 lb Starting Weight: 240 lb Total Weight Loss (lbs): 52 lb (23.6 kg)   Body Composition  Body Fat %: 42.3 % Fat Mass (lbs): 79.6 lbs Muscle Mass (lbs): 103 lbs Total Body Water (lbs): 72.8 lbs Visceral Fat Rating : 12   Other Clinical Data Fasting: No Labs: No Today's Visit #: 75 Starting Date: 08/10/16    Chief Complaint: OBESITY   History of Present Illness   The patient, with a history of emotional eating behaviors, polyphasia, and obesity, presents for a discussion on her depression and a request for medication refills. She is currently on Moexipril and Zepbound 12.5mg  for her conditions. Despite efforts towards weight loss, she has gained four pounds recently and has been adhering to her category two plan approximately 50% of the time. She is not currently engaging in any exercise.  The patient reports a struggle with temptation when it comes to food, particularly sweets, even when not experiencing excessive hunger. She notes a recent increase in the desire for foods she had previously avoided. She believes the Zepbound is helping manage her polyphasia, but non-hunger desires to eat persist.  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 She has a transgender child and expresses worry about the potential impacts of the election on them.  The patient is due to move to [place] soon and is managing the stressors as best as she can. She has an appointment scheduled for December and is planning to book one for January. She  acknowledges the need to get back into the right mindset to manage her eating behaviors, particularly with the upcoming holiday season.          PHYSICAL EXAM:  Blood pressure 120/73, pulse 74, temperature (!) 97.4 F (36.3 C), height 5\' 3"  (1.6 m), weight 188 lb (85.3 kg), last menstrual period 04/14/2016, SpO2 98%. Body mass index is 33.3 kg/m.  DIAGNOSTIC DATA REVIEWED:  BMET    Component Value Date/Time   NA 144 07/07/2020 0946   K 4.1 07/07/2020 0946   CL 104 07/07/2020 0946   CO2 25 07/07/2020 0946   GLUCOSE 108 (H) 07/07/2020 0946   GLUCOSE 110 (H) 01/07/2019 0930   BUN 17 07/07/2020 0946   CREATININE 0.93 07/07/2020 0946   CALCIUM 9.4 07/07/2020 0946   GFRNONAA 67 07/07/2020 0946   GFRAA 77 07/07/2020 0946   Lab Results  Component Value Date   HGBA1C 5.2 07/07/2020   HGBA1C 5.3 08/10/2016   Lab Results  Component Value Date   INSULIN 13.9 07/07/2020   INSULIN 38.9 (H) 08/10/2016   Lab Results  Component Value Date   TSH 1.340 07/07/2020   CBC    Component Value Date/Time   WBC 5.5 07/07/2020 0946   WBC 8.8 01/07/2019 0930   RBC 4.83 07/07/2020 0946   RBC 4.54 01/07/2019 0930   HGB 13.5 07/07/2020 0946   HCT 41.1 07/07/2020 0946   PLT 297 07/07/2020 0946   MCV 85 07/07/2020 0946   MCH 28.0 07/07/2020 0946   MCH 28.2 01/07/2019 0930  MCHC 32.8 07/07/2020 0946   MCHC 32.9 01/07/2019 0930   RDW 13.3 07/07/2020 0946   Iron Studies No results found for: "IRON", "TIBC", "FERRITIN", "IRONPCTSAT" Lipid Panel     Component Value Date/Time   CHOL 254 (H) 07/07/2020 0946   TRIG 115 07/07/2020 0946   HDL 60 07/07/2020 0946   LDLCALC 174 (H) 07/07/2020 0946   Hepatic Function Panel     Component Value Date/Time   PROT 6.8 07/07/2020 0946   ALBUMIN 4.5 07/07/2020 0946   AST 19 07/07/2020 0946   ALT 16 07/07/2020 0946   ALKPHOS 88 07/07/2020 0946   BILITOT 0.5 07/07/2020 0946      Component Value Date/Time   TSH 1.340 07/07/2020 0946    Nutritional Lab Results  Component Value Date   VD25OH 51.6 04/18/2023   VD25OH 44.0 07/07/2020   VD25OH 40.1 02/25/2020     Assessment and Plan    Depression with Emotional Eating Reports high stress levels contributing to emotional eating behaviors. Currently on Lexapro and requests a refill. Stressors include work-related issues, personal life events such as moving, and family concerns. Discussed managing stress to control emotional eating.  - Refill Lexapro -continue to work on EEB strategies  Obesity 4-pound weight gain in the last month. Following category two plan approximately 50% of the time and not exercising. Stress and emotional eating are contributing factors. Discussed adherence to the weight loss plan, regular exercise, and the benefits of weight loss, including reduced cardiovascular risk and improved health. Emphasized the importance of support systems during high-stress periods. - Encourage adherence to category two plan - Discuss importance of regular exercise - Schedule follow-up appointment in December  Polyphagia On Zepbound 12.5 mg and reports it is helping with excessive hunger. Requests a refill. Discussed Zepbound's benefits in controlling polyphagia and the importance of adherence. Informed about potential side effects such as nausea and monitoring for adverse reactions. - Refill Zepbound 12.5 mg  Follow-up - Schedule January appointment.       She was informed of the importance of frequent follow up visits to maximize her success with intensive lifestyle modifications for her multiple health conditions.    Quillian Quince, MD

## 2023-07-04 ENCOUNTER — Ambulatory Visit (INDEPENDENT_AMBULATORY_CARE_PROVIDER_SITE_OTHER): Payer: 59 | Admitting: Family Medicine

## 2023-07-09 ENCOUNTER — Other Ambulatory Visit: Payer: Self-pay

## 2023-07-10 ENCOUNTER — Other Ambulatory Visit: Payer: Self-pay

## 2023-07-31 ENCOUNTER — Other Ambulatory Visit: Payer: Self-pay

## 2023-07-31 DIAGNOSIS — E039 Hypothyroidism, unspecified: Secondary | ICD-10-CM | POA: Diagnosis not present

## 2023-07-31 DIAGNOSIS — E782 Mixed hyperlipidemia: Secondary | ICD-10-CM | POA: Diagnosis not present

## 2023-08-01 ENCOUNTER — Encounter (INDEPENDENT_AMBULATORY_CARE_PROVIDER_SITE_OTHER): Payer: Self-pay | Admitting: Family Medicine

## 2023-08-01 ENCOUNTER — Other Ambulatory Visit: Payer: Self-pay

## 2023-08-01 ENCOUNTER — Ambulatory Visit (INDEPENDENT_AMBULATORY_CARE_PROVIDER_SITE_OTHER): Payer: 59 | Admitting: Family Medicine

## 2023-08-01 VITALS — BP 108/70 | HR 76 | Temp 97.8°F | Ht 63.0 in | Wt 188.0 lb

## 2023-08-01 DIAGNOSIS — F3289 Other specified depressive episodes: Secondary | ICD-10-CM | POA: Diagnosis not present

## 2023-08-01 DIAGNOSIS — E669 Obesity, unspecified: Secondary | ICD-10-CM | POA: Diagnosis not present

## 2023-08-01 DIAGNOSIS — E559 Vitamin D deficiency, unspecified: Secondary | ICD-10-CM

## 2023-08-01 DIAGNOSIS — Z6833 Body mass index (BMI) 33.0-33.9, adult: Secondary | ICD-10-CM | POA: Diagnosis not present

## 2023-08-01 DIAGNOSIS — R632 Polyphagia: Secondary | ICD-10-CM

## 2023-08-01 MED ORDER — ESCITALOPRAM OXALATE 20 MG PO TABS
20.0000 mg | ORAL_TABLET | Freq: Every morning | ORAL | 0 refills | Status: DC
  Filled 2023-08-01 – 2023-10-12 (×2): qty 90, 90d supply, fill #0

## 2023-08-01 MED ORDER — VITAMIN D (ERGOCALCIFEROL) 1.25 MG (50000 UNIT) PO CAPS
50000.0000 [IU] | ORAL_CAPSULE | ORAL | 0 refills | Status: DC
  Filled 2023-08-01 – 2023-10-12 (×2): qty 12, 84d supply, fill #0

## 2023-08-01 MED ORDER — ZEPBOUND 15 MG/0.5ML ~~LOC~~ SOAJ
15.0000 mg | SUBCUTANEOUS | 0 refills | Status: DC
  Filled 2023-08-01: qty 2, 28d supply, fill #0
  Filled 2023-09-14: qty 2, 28d supply, fill #1
  Filled 2023-10-12: qty 2, 28d supply, fill #2

## 2023-08-01 NOTE — Progress Notes (Signed)
.smr  Office: [phone]  /  Fax: [phone]  WEIGHT SUMMARY AND BIOMETRICS  Anthropometric Measurements Height: 5\' 3"  (1.6 m) Weight: 188 lb (85.3 kg) BMI (Calculated): 33.31 Weight at Last Visit: 188 b Weight Lost Since Last Visit: 104 Weight Gained Since Last Visit: 0 Starting Weight: 0 Total Weight Loss (lbs): 52 lb (23.6 kg)   Body Composition  Body Fat %: 41.8 % Fat Mass (lbs): 78.6 lbs Muscle Mass (lbs): 104 lbs Total Body Water (lbs): 73.4 lbs Visceral Fat Rating : 12   Other Clinical Data Fasting: no Labs: no Today's Visit #: 50 Starting Date: 08/10/16    Chief Complaint: OBESITY   History of Present Illness   The patient, with a history of vitamin D deficiency, polyphasia, emotional eating behaviors, and obesity, presents for a routine follow-up. She is currently on Lexapro 20mg  for emotional eating behaviors, Zepbound 12.5mg  weekly for polyphasia, and Vitamin D 50,000 international units weekly. The patient reports no significant changes in her weight over the past month and admits to adhering to her category two eating plan about fifty percent of the time. She is not currently engaging in any exercise.  The patient has recently experienced disruptions to her routine due to travel and personal circumstances, which she believes may have contributed to her weight maintenance rather than loss. She expresses a desire to lose additional weight before her planned retirement in the coming year. She also notes an increase in cravings for sweets and breads, and requests an increase in her Zepbound dosage to 15mg  to help manage these cravings.  The patient also reports some stress related to her work and financial situation, with a desire to retire but financial obligations preventing her from doing so at this time. She expresses a desire to sell her current home and move in with her daughter to alleviate some of these financial pressures. Despite these challenges, the  patient remains committed to her health goals and is seeking ways to improve her adherence to her eating plan and incorporate more exercise into her routine.          PHYSICAL EXAM:  Blood pressure 108/70, pulse 76, temperature 97.8 F (36.6 C), height 5\' 3"  (1.6 m), weight 188 lb (85.3 kg), last menstrual period 04/14/2016, SpO2 95%. Body mass index is 33.3 kg/m.  DIAGNOSTIC DATA REVIEWED:  BMET    Component Value Date/Time   NA 144 07/07/2020 0946   K 4.1 07/07/2020 0946   CL 104 07/07/2020 0946   CO2 25 07/07/2020 0946   GLUCOSE 108 (H) 07/07/2020 0946   GLUCOSE 110 (H) 01/07/2019 0930   BUN 17 07/07/2020 0946   CREATININE 0.93 07/07/2020 0946   CALCIUM 9.4 07/07/2020 0946   GFRNONAA 67 07/07/2020 0946   GFRAA 77 07/07/2020 0946   Lab Results  Component Value Date   HGBA1C 5.2 07/07/2020   HGBA1C 5.3 08/10/2016   Lab Results  Component Value Date   INSULIN 13.9 07/07/2020   INSULIN 38.9 (H) 08/10/2016   Lab Results  Component Value Date   TSH 1.340 07/07/2020   CBC    Component Value Date/Time   WBC 5.5 07/07/2020 0946   WBC 8.8 01/07/2019 0930   RBC 4.83 07/07/2020 0946   RBC 4.54 01/07/2019 0930   HGB 13.5 07/07/2020 0946   HCT 41.1 07/07/2020 0946   PLT 297 07/07/2020 0946   MCV 85 07/07/2020 0946   MCH 28.0 07/07/2020 0946   MCH 28.2 01/07/2019 0930   MCHC 32.8  07/07/2020 0946   MCHC 32.9 01/07/2019 0930   RDW 13.3 07/07/2020 0946   Iron Studies No results found for: "IRON", "TIBC", "FERRITIN", "IRONPCTSAT" Lipid Panel     Component Value Date/Time   CHOL 254 (H) 07/07/2020 0946   TRIG 115 07/07/2020 0946   HDL 60 07/07/2020 0946   LDLCALC 174 (H) 07/07/2020 0946   Hepatic Function Panel     Component Value Date/Time   PROT 6.8 07/07/2020 0946   ALBUMIN 4.5 07/07/2020 0946   AST 19 07/07/2020 0946   ALT 16 07/07/2020 0946   ALKPHOS 88 07/07/2020 0946   BILITOT 0.5 07/07/2020 0946      Component Value Date/Time   TSH 1.340  07/07/2020 0946   Nutritional Lab Results  Component Value Date   VD25OH 51.6 04/18/2023   VD25OH 44.0 07/07/2020   VD25OH 40.1 02/25/2020     Assessment and Plan    Polyphagia Currently on Zepbound 12.5 mg weekly. Requests increase to 15 mg due to increased cravings for sweets and breads, possibly due to lifestyle changes and stress. No nausea or other side effects reported. Discussed benefits of dose increase and maintaining a consistent routine. - Increase Zepbound to 15 mg weekly - Refill Zepbound  Emotional Eating Behaviors On Lexapro 20 mg. Requests refill. Reports increased cravings for sweets and breads, possibly due to lifestyle changes and stress. Discussed psychological and physiological factors contributing to cravings and importance of medication adherence. - Refill Lexapro 20 mg  Obesity Maintained weight over the last month but not exercising. Following eating plan 50% of the time. Considering starting exercise in the new year. Discussed impact of insulin on hunger signals and fat storage. Emphasized importance of resuming exercise and possibly a short-term detox after holidays to reset eating habits. - Encourage resumption of exercise routine - Consider short-term detox after holidays  Vitamin D Deficiency Vitamin D level at goal (51) in September with no signs of over-replacement. Currently on Vitamin D 50,000 IU weekly. Discussed importance of maintaining current dosage due to shorter days and potential for levels to drop if discontinued. - Refill Vitamin D 50,000 IU weekly  General Health Maintenance Recent labs from PCP showed total cholesterol at 202, with triglycerides down and HDL high. LDL within normal limits. Discussed importance of continuing to monitor cholesterol levels and maintaining a healthy diet. - Continue monitoring cholesterol levels  Follow-up - Schedule February appointment to ensure continuity of care.        She was informed of the  importance of frequent follow up visits to maximize her success with intensive lifestyle modifications for her multiple health conditions.    Quillian Quince, MD

## 2023-08-07 DIAGNOSIS — E039 Hypothyroidism, unspecified: Secondary | ICD-10-CM | POA: Diagnosis not present

## 2023-08-07 DIAGNOSIS — K219 Gastro-esophageal reflux disease without esophagitis: Secondary | ICD-10-CM | POA: Diagnosis not present

## 2023-08-07 DIAGNOSIS — Z6834 Body mass index (BMI) 34.0-34.9, adult: Secondary | ICD-10-CM | POA: Diagnosis not present

## 2023-08-07 DIAGNOSIS — E782 Mixed hyperlipidemia: Secondary | ICD-10-CM | POA: Diagnosis not present

## 2023-08-07 DIAGNOSIS — E66811 Obesity, class 1: Secondary | ICD-10-CM | POA: Diagnosis not present

## 2023-08-07 DIAGNOSIS — E6609 Other obesity due to excess calories: Secondary | ICD-10-CM | POA: Diagnosis not present

## 2023-08-20 ENCOUNTER — Other Ambulatory Visit: Payer: Self-pay

## 2023-08-21 ENCOUNTER — Other Ambulatory Visit: Payer: Self-pay

## 2023-08-21 MED ORDER — LEVOTHYROXINE SODIUM 112 MCG PO TABS
112.0000 ug | ORAL_TABLET | Freq: Every day | ORAL | 1 refills | Status: AC
  Filled 2023-08-21: qty 90, 90d supply, fill #0
  Filled 2023-11-21: qty 90, 90d supply, fill #1
  Filled 2024-02-21: qty 90, 90d supply, fill #2

## 2023-08-29 ENCOUNTER — Encounter (INDEPENDENT_AMBULATORY_CARE_PROVIDER_SITE_OTHER): Payer: Self-pay | Admitting: Family Medicine

## 2023-08-29 ENCOUNTER — Ambulatory Visit (INDEPENDENT_AMBULATORY_CARE_PROVIDER_SITE_OTHER): Payer: 59 | Admitting: Family Medicine

## 2023-08-29 VITALS — BP 123/76 | HR 71 | Temp 98.0°F | Ht 63.0 in | Wt 187.0 lb

## 2023-08-29 DIAGNOSIS — E669 Obesity, unspecified: Secondary | ICD-10-CM

## 2023-08-29 DIAGNOSIS — Z6833 Body mass index (BMI) 33.0-33.9, adult: Secondary | ICD-10-CM | POA: Diagnosis not present

## 2023-08-29 DIAGNOSIS — R632 Polyphagia: Secondary | ICD-10-CM

## 2023-08-29 DIAGNOSIS — E559 Vitamin D deficiency, unspecified: Secondary | ICD-10-CM | POA: Diagnosis not present

## 2023-08-29 NOTE — Progress Notes (Signed)
 .smr  Office: [phone]  /  Fax: [phone]  WEIGHT SUMMARY AND BIOMETRICS  Anthropometric Measurements Height: 5\' 3"  (1.6 m) Weight: 187 lb (84.8 kg) BMI (Calculated): 33.13 Weight at Last Visit: 188 lb Weight Lost Since Last Visit: 1 lb Weight Gained Since Last Visit: 0 Total Weight Loss (lbs): 53 lb (24 kg)   Body Composition  Body Fat %: 42 % Fat Mass (lbs): 78.6 lbs Muscle Mass (lbs): 103 lbs Total Body Water (lbs): 72.4 lbs Visceral Fat Rating : 12   Other Clinical Data Fasting: No Labs: No Today's Visit #: 40 Starting Date: 08/10/16    Chief Complaint: OBESITY   History of Present Illness   The patient, with a history of obesity and vitamin D  deficiency, presents for a follow-up consultation. She reports successful weight loss, having lost a pound over the holiday season. She attributes this success to adhering to her category two eating plan approximately 75% of the time. However, she admits to not currently engaging in any exercise regimen.  Regarding her vitamin D  deficiency, she is on a weekly prescription of 50,000 international units of vitamin D . Her last vitamin D  level, checked in September, was within the goal range at 51.6.  The patient also reports some issues with her dietary habits, particularly with protein intake. She expresses difficulty in preparing protein-rich meals, especially during the evening when she feels tired. She acknowledges the need to improve her meal preparation habits and is considering options for easy-to-prepare meals with adequate protein content.  In terms of medication, the patient has recently increased her dose of an unspecified medication to 15 units. She reports experiencing some loose stools since the increase, which she believes could be related to her diet at the time. She denies any nausea associated with the medication but does report a slight crampy feeling.  The patient also discusses her grocery shopping  habits, noting that she often makes impulse purchases when shopping in-store. She is considering switching to online grocery shopping to help control these impulses and make healthier choices.  The patient is committed to improving her health and is actively seeking strategies to enhance her diet and exercise habits. She expresses a clear understanding of what works for her and what doesn't, and is motivated to make necessary changes.          PHYSICAL EXAM:  Blood pressure 123/76, pulse 71, temperature 98 F (36.7 C), height 5\' 3"  (1.6 m), weight 187 lb (84.8 kg), last menstrual period 04/14/2016, SpO2 98%. Body mass index is 33.13 kg/m.  DIAGNOSTIC DATA REVIEWED:  BMET    Component Value Date/Time   NA 144 07/07/2020 0946   K 4.1 07/07/2020 0946   CL 104 07/07/2020 0946   CO2 25 07/07/2020 0946   GLUCOSE 108 (H) 07/07/2020 0946   GLUCOSE 110 (H) 01/07/2019 0930   BUN 17 07/07/2020 0946   CREATININE 0.93 07/07/2020 0946   CALCIUM 9.4 07/07/2020 0946   GFRNONAA 67 07/07/2020 0946   GFRAA 77 07/07/2020 0946   Lab Results  Component Value Date   HGBA1C 5.2 07/07/2020   HGBA1C 5.3 08/10/2016   Lab Results  Component Value Date   INSULIN  13.9 07/07/2020   INSULIN  38.9 (H) 08/10/2016   Lab Results  Component Value Date   TSH 1.340 07/07/2020   CBC    Component Value Date/Time   WBC 5.5 07/07/2020 0946   WBC 8.8 01/07/2019 0930   RBC 4.83 07/07/2020 0946   RBC 4.54 01/07/2019 0930  HGB 13.5 07/07/2020 0946   HCT 41.1 07/07/2020 0946   PLT 297 07/07/2020 0946   MCV 85 07/07/2020 0946   MCH 28.0 07/07/2020 0946   MCH 28.2 01/07/2019 0930   MCHC 32.8 07/07/2020 0946   MCHC 32.9 01/07/2019 0930   RDW 13.3 07/07/2020 0946   Iron Studies No results found for: "IRON", "TIBC", "FERRITIN", "IRONPCTSAT" Lipid Panel     Component Value Date/Time   CHOL 254 (H) 07/07/2020 0946   TRIG 115 07/07/2020 0946   HDL 60 07/07/2020 0946   LDLCALC 174 (H) 07/07/2020 0946    Hepatic Function Panel     Component Value Date/Time   PROT 6.8 07/07/2020 0946   ALBUMIN 4.5 07/07/2020 0946   AST 19 07/07/2020 0946   ALT 16 07/07/2020 0946   ALKPHOS 88 07/07/2020 0946   BILITOT 0.5 07/07/2020 0946      Component Value Date/Time   TSH 1.340 07/07/2020 0946   Nutritional Lab Results  Component Value Date   VD25OH 51.6 04/18/2023   VD25OH 44.0 07/07/2020   VD25OH 40.1 02/25/2020     Assessment and Plan    Obesity Obesity with recent weight loss. Lost one pound over the holidays. Following a category two eating plan 75% of the time. No current exercise regimen. Discussed protein intake, meal preparation strategies, and psychological aspects of committing to exercise. Suggested online grocery ordering to avoid impulse purchases. - Watch exercise videos as a first step towards exercising. - Have high-protein emergency meals available. - Order groceries online to avoid impulse purchases.  Vitamin D  Deficiency Vitamin D  deficiency managed with prescription vitamin D  50,000 IU weekly. Last vitamin D  level in September was at goal (51.6 ng/mL). - Continue current vitamin D  supplementation regimen.  Polyphagia On Mounjaro , reports mild loose stools potentially related to Mounjaro  and dietary intake. No nausea with Mounjaro . Discussed the importance of slow eating to avoid nausea. - Continue Mounjaro  with current dosing. - Monitor for side effects and dietary impact. -  Follow-up - Reschedule February appointment. - Schedule March appointment.          She was informed of the importance of frequent follow up visits to maximize her success with intensive lifestyle modifications for her multiple health conditions.    Jasmine Mesi, MD

## 2023-09-03 ENCOUNTER — Other Ambulatory Visit: Payer: Self-pay

## 2023-09-14 ENCOUNTER — Other Ambulatory Visit: Payer: Self-pay

## 2023-09-26 ENCOUNTER — Ambulatory Visit (INDEPENDENT_AMBULATORY_CARE_PROVIDER_SITE_OTHER): Payer: 59 | Admitting: Family Medicine

## 2023-10-03 ENCOUNTER — Telehealth (INDEPENDENT_AMBULATORY_CARE_PROVIDER_SITE_OTHER): Payer: 59 | Admitting: Family Medicine

## 2023-10-03 ENCOUNTER — Telehealth (INDEPENDENT_AMBULATORY_CARE_PROVIDER_SITE_OTHER): Payer: Self-pay | Admitting: *Deleted

## 2023-10-03 DIAGNOSIS — E669 Obesity, unspecified: Secondary | ICD-10-CM

## 2023-10-03 DIAGNOSIS — E782 Mixed hyperlipidemia: Secondary | ICD-10-CM | POA: Diagnosis not present

## 2023-10-03 DIAGNOSIS — Z6833 Body mass index (BMI) 33.0-33.9, adult: Secondary | ICD-10-CM

## 2023-10-03 NOTE — Progress Notes (Signed)
.smr  Office: [phone]  /  Fax: [phone]  WEIGHT SUMMARY AND BIOMETRICS  No data recorded No data recorded No data recorded  Chief Complaint: OBESITY  I connected with  LARYSA PALL on 10/03/23 by a video enabled telemedicine application and verified that I am speaking with the correct person using two identifiers.   I discussed the limitations of evaluation and management by telemedicine. The patient expressed understanding and agreed to proceed.   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  She has successfully maintained a weight loss of 53 pounds but wants to lose more. Her current eating habits are less disciplined than at the start of her weight loss journey. She follows a category two eating plan but is inconsistent, especially at dinner. She often skips meals due to lack of hunger or fatigue after work and occasionally uses protein drinks as meal replacements. She avoids fast food, even when purchasing it for her daughter.  Her physical activity includes walking when possible, both at home and during breaks at work, though not consistently. She estimates her daily steps to be around 6,000, primarily due to her job, which involves standing for extended periods. She has considered using ankle weights to increase physical activity but has not yet done so.  Her stress levels are high, primarily due to family dynamics and supporting her daughter, who is experiencing stress related to the political environment as a transgender person.  She is currently taking Zetia for mixed hyperlipidemia and has one refill remaining on her prescription. She plans to refill her medications at her next appointment, which is already scheduled.          PHYSICAL EXAM:  Last menstrual period 04/14/2016. There is no height or weight on  file to calculate BMI.  DIAGNOSTIC DATA REVIEWED:  BMET    Component Value Date/Time   NA 144 07/07/2020 0946   K 4.1 07/07/2020 0946   CL 104 07/07/2020 0946   CO2 25 07/07/2020 0946   GLUCOSE 108 (H) 07/07/2020 0946   GLUCOSE 110 (H) 01/07/2019 0930   BUN 17 07/07/2020 0946   CREATININE 0.93 07/07/2020 0946   CALCIUM 9.4 07/07/2020 0946   GFRNONAA 67 07/07/2020 0946   GFRAA 77 07/07/2020 0946   Lab Results  Component Value Date   HGBA1C 5.2 07/07/2020   HGBA1C 5.3 08/10/2016   Lab Results  Component Value Date   INSULIN 13.9 07/07/2020   INSULIN 38.9 (H) 08/10/2016   Lab Results  Component Value Date   TSH 1.340 07/07/2020   CBC    Component Value Date/Time   WBC 5.5 07/07/2020 0946   WBC 8.8 01/07/2019 0930   RBC 4.83 07/07/2020 0946   RBC 4.54 01/07/2019 0930   HGB 13.5 07/07/2020 0946   HCT 41.1 07/07/2020 0946   PLT 297 07/07/2020 0946   MCV 85 07/07/2020 0946   MCH 28.0 07/07/2020 0946   MCH 28.2 01/07/2019 0930   MCHC 32.8 07/07/2020 0946   MCHC 32.9 01/07/2019 0930   RDW 13.3 07/07/2020 0946   Iron Studies No results found for: "IRON", "TIBC", "FERRITIN", "IRONPCTSAT" Lipid Panel     Component Value Date/Time   CHOL 254 (H) 07/07/2020 0946   TRIG 115 07/07/2020 0946   HDL 60 07/07/2020 0946   LDLCALC 174 (H) 07/07/2020 8413  Hepatic Function Panel     Component Value Date/Time   PROT 6.8 07/07/2020 0946   ALBUMIN 4.5 07/07/2020 0946   AST 19 07/07/2020 0946   ALT 16 07/07/2020 0946   ALKPHOS 88 07/07/2020 0946   BILITOT 0.5 07/07/2020 0946      Component Value Date/Time   TSH 1.340 07/07/2020 0946   Nutritional Lab Results  Component Value Date   VD25OH 51.6 04/18/2023   VD25OH 44.0 07/07/2020   VD25OH 40.1 02/25/2020     Assessment and Plan    Obesity Patient has lost 53 pounds since starting the weight loss program and is currently maintaining her weight but wishes to lose more. She reports difficulty with meal planning  and consistency, particularly with dinner, and sometimes skips meals due to lack of hunger or tiredness. She struggles with adherence to the category two eating plan, especially in the evenings. Discussed the importance of planning meals ahead and having easy, healthy options available. Suggested wearing light ankle weights at work to increase physical activity without additional effort. - Continue category two eating plan - Avoid skipping meals - Consider having a protein drink for times of low appetite - Plan meals ahead to ensure availability of healthy options - Consider wearing light ankle weights at work - Follow up in four weeks  Mixed Hyperlipidemia Patient is managing mixed hyperlipidemia with Zetia, diet, exercise, and weight loss. Advised to continue choosing low cholesterol options. - Continue Zetia - Continue category two eating plan with focus on low cholesterol options - Follow up in four weeks  General Health Maintenance Patient is advised to continue her current health maintenance practices, including diet and exercise. - Continue current health maintenance practices  Follow-up - Follow up in March - Contact via MyChart if needed before the next appointment.         I have personally spent 30 minutes total time today in preparation, patient care, and documentation for this visit, including the following: review of clinical lab tests; review of medical tests/procedures/services.    She was informed of the importance of frequent follow up visits to maximize her success with intensive lifestyle modifications for her multiple health conditions.    Quillian Quince, MD

## 2023-10-03 NOTE — Telephone Encounter (Signed)
Called patient to go over video questions for visit today and that she should log onto Mychart for visit at appointment time.

## 2023-10-03 NOTE — Telephone Encounter (Signed)
Called patient to do video visit questions,no answer but left a voice message to call us back .

## 2023-10-12 ENCOUNTER — Other Ambulatory Visit: Payer: Self-pay

## 2023-10-26 ENCOUNTER — Other Ambulatory Visit: Payer: Self-pay

## 2023-10-31 ENCOUNTER — Ambulatory Visit (INDEPENDENT_AMBULATORY_CARE_PROVIDER_SITE_OTHER): Payer: 59 | Admitting: Family Medicine

## 2023-11-12 ENCOUNTER — Encounter (INDEPENDENT_AMBULATORY_CARE_PROVIDER_SITE_OTHER): Payer: Self-pay | Admitting: Family Medicine

## 2023-11-12 ENCOUNTER — Other Ambulatory Visit (INDEPENDENT_AMBULATORY_CARE_PROVIDER_SITE_OTHER): Payer: Self-pay | Admitting: Family Medicine

## 2023-11-12 ENCOUNTER — Other Ambulatory Visit: Payer: Self-pay

## 2023-11-12 ENCOUNTER — Ambulatory Visit (INDEPENDENT_AMBULATORY_CARE_PROVIDER_SITE_OTHER): Admitting: Family Medicine

## 2023-11-12 VITALS — BP 125/76 | HR 74 | Temp 97.9°F | Ht 63.0 in | Wt 190.0 lb

## 2023-11-12 DIAGNOSIS — G8929 Other chronic pain: Secondary | ICD-10-CM | POA: Diagnosis not present

## 2023-11-12 DIAGNOSIS — R632 Polyphagia: Secondary | ICD-10-CM

## 2023-11-12 DIAGNOSIS — E669 Obesity, unspecified: Secondary | ICD-10-CM

## 2023-11-12 DIAGNOSIS — F3289 Other specified depressive episodes: Secondary | ICD-10-CM

## 2023-11-12 DIAGNOSIS — R519 Headache, unspecified: Secondary | ICD-10-CM | POA: Insufficient documentation

## 2023-11-12 DIAGNOSIS — Z6833 Body mass index (BMI) 33.0-33.9, adult: Secondary | ICD-10-CM | POA: Diagnosis not present

## 2023-11-12 DIAGNOSIS — F5089 Other specified eating disorder: Secondary | ICD-10-CM

## 2023-11-12 DIAGNOSIS — E559 Vitamin D deficiency, unspecified: Secondary | ICD-10-CM | POA: Diagnosis not present

## 2023-11-12 MED ORDER — VITAMIN D (ERGOCALCIFEROL) 1.25 MG (50000 UNIT) PO CAPS
50000.0000 [IU] | ORAL_CAPSULE | ORAL | 0 refills | Status: DC
  Filled 2023-11-12: qty 12, 84d supply, fill #0

## 2023-11-12 MED ORDER — ESCITALOPRAM OXALATE 20 MG PO TABS
20.0000 mg | ORAL_TABLET | Freq: Every morning | ORAL | 0 refills | Status: DC
  Filled 2023-11-12: qty 90, 90d supply, fill #0

## 2023-11-12 MED ORDER — ZEPBOUND 15 MG/0.5ML ~~LOC~~ SOAJ
15.0000 mg | SUBCUTANEOUS | 0 refills | Status: DC
Start: 2023-11-12 — End: 2024-01-16
  Filled 2023-11-12: qty 2, 28d supply, fill #0
  Filled 2023-12-09: qty 2, 28d supply, fill #1

## 2023-11-12 NOTE — Progress Notes (Signed)
 Office: [phone]  /  Fax: [phone]  WEIGHT SUMMARY AND BIOMETRICS  Anthropometric Measurements Height: 5\' 3"  (1.6 m) Weight: 190 lb (86.2 kg) BMI (Calculated): 33.67 Weight at Last Visit: 187 lb Weight Lost Since Last Visit: 0 Weight Gained Since Last Visit: 3 lb Starting Weight: 240 lb Total Weight Loss (lbs): 50 lb (22.7 kg)   Body Composition  Body Fat %: 43.4 % Fat Mass (lbs): 82.8 lbs Muscle Mass (lbs): 102.4 lbs Visceral Fat Rating : 77.2   Other Clinical Data Fasting: No Labs: No Today's Visit #: 49 Starting Date: 08/10/16    Chief Complaint: OBESITY    History of Present Illness ROSILAND Smith is a 63 year old female who presents for obesity treatment plan discussion.  She has a history of obesity and is currently taking Zepbound for this condition. Despite her efforts, she has gained three pounds in the last six weeks since her last visit, which was virtual. She exercises daily for about thirty minutes and follows her category two eating plan fifty percent of the time. Emotional eating behaviors, particularly during stressful times such as preparing her house for sale and dealing with her daughter's illness, contribute to her weight gain. She often turns to chocolate, especially during the Easter season, and sometimes buys it impulsively, later giving it away to coworkers.  She is taking Lexapro for emotional eating behaviors. No issues with her current medications and she is ready for refills. Emotional eating is linked to stressors in her life, such as preparing her house for sale and her daughter's illness.  She experiences occipital headaches, which she attributes to stress. These headaches sometimes radiate to the right side of her head and are affecting her sleep.  She has a history of vitamin D deficiency and is on prescription vitamin D. Her most recent vitamin D level in September was 51, which is considered adequate. She is due for a recheck  in the spring.  No nausea with Zepbound but notes a mild headache the day after taking the shot, which resolves quickly.      PHYSICAL EXAM:  Blood pressure 125/76, pulse 74, temperature 97.9 F (36.6 C), height 5\' 3"  (1.6 m), weight 190 lb (86.2 kg), last menstrual period 04/14/2016, SpO2 99%. Body mass index is 33.66 kg/m.  DIAGNOSTIC DATA REVIEWED:  BMET    Component Value Date/Time   NA 144 07/07/2020 0946   K 4.1 07/07/2020 0946   CL 104 07/07/2020 0946   CO2 25 07/07/2020 0946   GLUCOSE 108 (H) 07/07/2020 0946   GLUCOSE 110 (H) 01/07/2019 0930   BUN 17 07/07/2020 0946   CREATININE 0.93 07/07/2020 0946   CALCIUM 9.4 07/07/2020 0946   GFRNONAA 67 07/07/2020 0946   GFRAA 77 07/07/2020 0946   Lab Results  Component Value Date   HGBA1C 5.2 07/07/2020   HGBA1C 5.3 08/10/2016   Lab Results  Component Value Date   INSULIN 13.9 07/07/2020   INSULIN 38.9 (H) 08/10/2016   Lab Results  Component Value Date   TSH 1.340 07/07/2020   CBC    Component Value Date/Time   WBC 5.5 07/07/2020 0946   WBC 8.8 01/07/2019 0930   RBC 4.83 07/07/2020 0946   RBC 4.54 01/07/2019 0930   HGB 13.5 07/07/2020 0946   HCT 41.1 07/07/2020 0946   PLT 297 07/07/2020 0946   MCV 85 07/07/2020 0946   MCH 28.0 07/07/2020 0946   MCH 28.2 01/07/2019 0930   MCHC 32.8 07/07/2020 0946  MCHC 32.9 01/07/2019 0930   RDW 13.3 07/07/2020 0946   Iron Studies No results found for: "IRON", "TIBC", "FERRITIN", "IRONPCTSAT" Lipid Panel     Component Value Date/Time   CHOL 254 (H) 07/07/2020 0946   TRIG 115 07/07/2020 0946   HDL 60 07/07/2020 0946   LDLCALC 174 (H) 07/07/2020 0946   Hepatic Function Panel     Component Value Date/Time   PROT 6.8 07/07/2020 0946   ALBUMIN 4.5 07/07/2020 0946   AST 19 07/07/2020 0946   ALT 16 07/07/2020 0946   ALKPHOS 88 07/07/2020 0946   BILITOT 0.5 07/07/2020 0946      Component Value Date/Time   TSH 1.340 07/07/2020 0946   Nutritional Lab Results   Component Value Date   VD25OH 51.6 04/18/2023   VD25OH 44.0 07/07/2020   VD25OH 40.1 02/25/2020     Assessment and Plan Assessment & Plan Obesity She is experiencing challenges with weight management, having gained three pounds in the last six weeks. She is on Zepbound for obesity management and reports no nausea, though she experiences a mild headache the day after administration. Emotional eating, particularly chocolate cravings, is a contributing factor, exacerbated by stressors such as preparing her house for sale and her daughter's illness. She follows her category two eating plan 50% of the time and exercises for about 30 minutes daily. Discussed strategies to manage emotional eating, including a high-protein, low-calorie chocolate pudding recipe to help manage cravings without feeling deprived. - Continue Zepbound for obesity management - Encourage adherence to the category two eating plan - Implement the suggested high-protein, low-calorie chocolate pudding recipe to manage cravings - Encourage regular exercise  Emotional Eating She reports emotional eating behaviors, particularly with chocolate, due to stress. She is on Lexapro to help manage these behaviors. Discussed the importance of finding a balance between deprivation and indulgence, suggesting a high-protein, low-calorie chocolate pudding as an alternative to manage cravings. - Continue Lexapro for emotional eating - Implement the suggested high-protein, low-calorie chocolate pudding recipe to manage cravings  Right Occipital Headache She reports experiencing occipital headaches, likely stress-related, affecting her sleep. She is considering physical therapy or a neurologist consultation to address the issue. Recommended a neurologist for further evaluation to rule out other causes and discussed the possibility of an MRI to ensure there are no underlying issues such as a brain tumor. - Refer to neurologist for headache  evaluation - Consider MRI if recommended by neurologist  Vitamin D Deficiency Her vitamin D level was last checked in September and was 51, which is within the normal range. She is on prescription vitamin D. - Recheck vitamin D levels in 1-2 months    She was informed of the importance of frequent follow up visits to maximize her success with intensive lifestyle modifications for her multiple health conditions.    Quillian Quince, MD

## 2023-12-03 ENCOUNTER — Other Ambulatory Visit: Payer: Self-pay

## 2023-12-04 ENCOUNTER — Other Ambulatory Visit: Payer: Self-pay

## 2023-12-09 ENCOUNTER — Other Ambulatory Visit: Payer: Self-pay

## 2023-12-10 ENCOUNTER — Other Ambulatory Visit: Payer: Self-pay

## 2023-12-11 ENCOUNTER — Other Ambulatory Visit: Payer: Self-pay

## 2023-12-11 MED ORDER — PANTOPRAZOLE SODIUM 40 MG PO TBEC
40.0000 mg | DELAYED_RELEASE_TABLET | Freq: Every day | ORAL | 1 refills | Status: DC
  Filled 2023-12-11: qty 90, 90d supply, fill #0
  Filled 2024-03-16: qty 90, 90d supply, fill #1
  Filled 2024-06-24: qty 20, 20d supply, fill #2

## 2023-12-12 ENCOUNTER — Ambulatory Visit (INDEPENDENT_AMBULATORY_CARE_PROVIDER_SITE_OTHER): Admitting: Family Medicine

## 2023-12-12 ENCOUNTER — Encounter (INDEPENDENT_AMBULATORY_CARE_PROVIDER_SITE_OTHER): Payer: Self-pay | Admitting: Family Medicine

## 2023-12-12 VITALS — BP 113/73 | HR 75 | Temp 97.7°F | Ht 63.0 in | Wt 190.0 lb

## 2023-12-12 DIAGNOSIS — E669 Obesity, unspecified: Secondary | ICD-10-CM | POA: Diagnosis not present

## 2023-12-12 DIAGNOSIS — F439 Reaction to severe stress, unspecified: Secondary | ICD-10-CM | POA: Diagnosis not present

## 2023-12-12 DIAGNOSIS — R632 Polyphagia: Secondary | ICD-10-CM | POA: Diagnosis not present

## 2023-12-12 DIAGNOSIS — Z6833 Body mass index (BMI) 33.0-33.9, adult: Secondary | ICD-10-CM | POA: Diagnosis not present

## 2023-12-12 NOTE — Progress Notes (Signed)
 Office: [phone]  /  Fax: [phone]  WEIGHT SUMMARY AND BIOMETRICS  Anthropometric Measurements Height: 5\' 3"  (1.6 m) Weight: 190 lb (86.2 kg) BMI (Calculated): 33.67 Weight at Last Visit: 190lb Weight Lost Since Last Visit: 0 Weight Gained Since Last Visit: 0 Starting Weight: 240lb Total Weight Loss (lbs): 50 lb (22.7 kg)   Body Composition  Body Fat %: 43.1 % Fat Mass (lbs): 82.2 lbs Muscle Mass (lbs): 103 lbs Total Body Water (lbs): 75.8 lbs Visceral Fat Rating : 12   Other Clinical Data Fasting: yes Labs: no Today's Visit #: 80 Starting Date: 08/10/16    Chief Complaint: OBESITY    History of Present Illness Katherine Smith is a 63 year old female with obesity who presents for a follow-up on her obesity treatment plan and progress assessment.  She adheres to her category two eating plan about fifty percent of the time and engages in a walking video exercise for thirty minutes, five times a week. Despite recent stressors, she has maintained her weight over the last month and has lost fifty pounds since starting her current regimen.  Her stress level is high due to her mother's recent fall and hip fracture, which required hospitalization and subsequent transfer to a rehabilitation facility. This situation has disrupted her routine, but she managed to avoid stress-related eating by staying at the hospital overnight, away from food temptations.  She is currently on Zepbound  15 mg weekly for obesity and polyphagia. She is trying to increase her physical activity, although her routine was disrupted by recent events. She typically exercises in the evening and feels stronger when she maintains this routine.      PHYSICAL EXAM:  Blood pressure 113/73, pulse 75, temperature 97.7 F (36.5 C), height 5\' 3"  (1.6 m), weight 190 lb (86.2 kg), last menstrual period 04/14/2016, SpO2 96%. Body mass index is 33.66 kg/m.  DIAGNOSTIC DATA REVIEWED:  BMET     Component Value Date/Time   NA 144 07/07/2020 0946   K 4.1 07/07/2020 0946   CL 104 07/07/2020 0946   CO2 25 07/07/2020 0946   GLUCOSE 108 (H) 07/07/2020 0946   GLUCOSE 110 (H) 01/07/2019 0930   BUN 17 07/07/2020 0946   CREATININE 0.93 07/07/2020 0946   CALCIUM 9.4 07/07/2020 0946   GFRNONAA 67 07/07/2020 0946   GFRAA 77 07/07/2020 0946   Lab Results  Component Value Date   HGBA1C 5.2 07/07/2020   HGBA1C 5.3 08/10/2016   Lab Results  Component Value Date   INSULIN  13.9 07/07/2020   INSULIN  38.9 (H) 08/10/2016   Lab Results  Component Value Date   TSH 1.340 07/07/2020   CBC    Component Value Date/Time   WBC 5.5 07/07/2020 0946   WBC 8.8 01/07/2019 0930   RBC 4.83 07/07/2020 0946   RBC 4.54 01/07/2019 0930   HGB 13.5 07/07/2020 0946   HCT 41.1 07/07/2020 0946   PLT 297 07/07/2020 0946   MCV 85 07/07/2020 0946   MCH 28.0 07/07/2020 0946   MCH 28.2 01/07/2019 0930   MCHC 32.8 07/07/2020 0946   MCHC 32.9 01/07/2019 0930   RDW 13.3 07/07/2020 0946   Iron Studies No results found for: "IRON", "TIBC", "FERRITIN", "IRONPCTSAT" Lipid Panel     Component Value Date/Time   CHOL 254 (H) 07/07/2020 0946   TRIG 115 07/07/2020 0946   HDL 60 07/07/2020 0946   LDLCALC 174 (H) 07/07/2020 0946   Hepatic Function Panel     Component Value Date/Time  PROT 6.8 07/07/2020 0946   ALBUMIN 4.5 07/07/2020 0946   AST 19 07/07/2020 0946   ALT 16 07/07/2020 0946   ALKPHOS 88 07/07/2020 0946   BILITOT 0.5 07/07/2020 0946      Component Value Date/Time   TSH 1.340 07/07/2020 0946   Nutritional Lab Results  Component Value Date   VD25OH 51.6 04/18/2023   VD25OH 44.0 07/07/2020   VD25OH 40.1 02/25/2020     Assessment and Plan Assessment & Plan Obesity Obesity management is ongoing with a weight loss of fifty pounds since starting the current regimen. She adheres to her category two eating plan about fifty percent of the time and engages in a walking video for thirty  minutes, five times a week. Despite high stress from family issues and selling her home, she has avoided stress-related eating. Her visceral fat is at twelve, within the desired range, and her blood pressure is well-controlled. - Continue Zepbound  15 mg weekly. - Continue current diet and exercise regimen. - Follow up in four to six weeks.  Polyphagia and Stress Polyphagia is managed with Zepbound  and lifestyle modifications. She is successfully working on decreasing emotional eating behaviors related to stress. - Continue Zepbound  15 mg weekly. - Continue working on decreasing emotional eating behaviors.     She was informed of the importance of frequent follow up visits to maximize her success with intensive lifestyle modifications for her multiple health conditions.    Jasmine Mesi, MD

## 2023-12-30 ENCOUNTER — Other Ambulatory Visit: Payer: Self-pay

## 2023-12-31 ENCOUNTER — Other Ambulatory Visit: Payer: Self-pay

## 2024-01-01 ENCOUNTER — Other Ambulatory Visit: Payer: Self-pay

## 2024-01-01 MED ORDER — EZETIMIBE 10 MG PO TABS
10.0000 mg | ORAL_TABLET | Freq: Every day | ORAL | 1 refills | Status: DC
  Filled 2024-01-01: qty 90, 90d supply, fill #0
  Filled 2024-03-26 (×2): qty 90, 90d supply, fill #1
  Filled 2024-07-05 – 2024-07-09 (×2): qty 20, 20d supply, fill #2

## 2024-01-03 ENCOUNTER — Other Ambulatory Visit: Payer: Self-pay

## 2024-01-16 ENCOUNTER — Ambulatory Visit (INDEPENDENT_AMBULATORY_CARE_PROVIDER_SITE_OTHER): Admitting: Family Medicine

## 2024-01-16 ENCOUNTER — Other Ambulatory Visit: Payer: Self-pay

## 2024-01-16 ENCOUNTER — Encounter (INDEPENDENT_AMBULATORY_CARE_PROVIDER_SITE_OTHER): Payer: Self-pay | Admitting: Family Medicine

## 2024-01-16 VITALS — BP 112/74 | HR 73 | Temp 97.6°F | Ht 63.0 in | Wt 191.0 lb

## 2024-01-16 DIAGNOSIS — Z6833 Body mass index (BMI) 33.0-33.9, adult: Secondary | ICD-10-CM | POA: Diagnosis not present

## 2024-01-16 DIAGNOSIS — E559 Vitamin D deficiency, unspecified: Secondary | ICD-10-CM | POA: Diagnosis not present

## 2024-01-16 DIAGNOSIS — E66813 Obesity, class 3: Secondary | ICD-10-CM

## 2024-01-16 DIAGNOSIS — F5089 Other specified eating disorder: Secondary | ICD-10-CM | POA: Diagnosis not present

## 2024-01-16 DIAGNOSIS — F3289 Other specified depressive episodes: Secondary | ICD-10-CM

## 2024-01-16 DIAGNOSIS — E669 Obesity, unspecified: Secondary | ICD-10-CM

## 2024-01-16 DIAGNOSIS — R632 Polyphagia: Secondary | ICD-10-CM

## 2024-01-16 MED ORDER — VITAMIN D (ERGOCALCIFEROL) 1.25 MG (50000 UNIT) PO CAPS
50000.0000 [IU] | ORAL_CAPSULE | ORAL | 0 refills | Status: DC
  Filled 2024-01-16: qty 12, 84d supply, fill #0

## 2024-01-16 MED ORDER — ESCITALOPRAM OXALATE 20 MG PO TABS
20.0000 mg | ORAL_TABLET | Freq: Every morning | ORAL | 0 refills | Status: DC
  Filled 2024-01-16: qty 90, 90d supply, fill #0

## 2024-01-16 MED ORDER — ZEPBOUND 15 MG/0.5ML ~~LOC~~ SOAJ
15.0000 mg | SUBCUTANEOUS | 0 refills | Status: DC
Start: 2024-01-16 — End: 2024-04-23
  Filled 2024-01-16: qty 2, 28d supply, fill #0
  Filled 2024-02-21: qty 2, 28d supply, fill #1
  Filled 2024-03-26: qty 2, 28d supply, fill #2

## 2024-01-16 NOTE — Progress Notes (Signed)
 Office: [phone]  /  Fax: [phone]  WEIGHT SUMMARY AND BIOMETRICS  Anthropometric Measurements Height: 5\' 3"  (1.6 m) Weight: 191 lb (86.6 kg) BMI (Calculated): 33.84 Weight at Last Visit: 190 lb Weight Lost Since Last Visit: 0 Weight Gained Since Last Visit: 1 lb Starting Weight: 240 lb Total Weight Loss (lbs): 49 lb (22.2 kg) Peak Weight: 255 lb   Body Composition  Body Fat %: 43.1 % Fat Mass (lbs): 82.6 lbs Muscle Mass (lbs): 103.4 lbs Total Body Water (lbs): 76 lbs Visceral Fat Rating : 12   Other Clinical Data Fasting: no Labs: no Today's Visit #: 73 Starting Date: 08/10/16    Chief Complaint: OBESITY    History of Present Illness Katherine Smith is a 63 year old female who presents for obesity treatment management.  She has been following the category two eating plan about fifty percent of the time and has not been able to exercise. Over the last month, she has mostly maintained her weight but has gained one pound. She is currently on Mounjaro  for weight management and experiences stress-related eating, particularly at night when she returns home late and seeks comfort or reward through food.  Her hunger is not a problem, but meal planning is challenging due to her busy schedule and having her house on the market. She often finds herself needing to pick up quick meals and is not in her usual routine.  Her social history includes significant caregiving responsibilities for her mother, who is in rehab following a hip fracture and hospitalization for low sodium levels. She visits her mother twice daily to assist with her care, which impacts her time for personal activities. She also supports her daughter, who is in a Manufacturing engineer and has ADHD and autism, by providing a supportive presence during study sessions.  She wants to incorporate more physical activity into her routine, potentially with the help of a personal trainer for accountability. She  acknowledges feeling better and sleeping better when she is more active.      PHYSICAL EXAM:  Blood pressure 112/74, pulse 73, temperature 97.6 F (36.4 C), height 5\' 3"  (1.6 m), weight 191 lb (86.6 kg), last menstrual period 04/14/2016, SpO2 98%. Body mass index is 33.83 kg/m.  DIAGNOSTIC DATA REVIEWED:  BMET    Component Value Date/Time   NA 144 07/07/2020 0946   K 4.1 07/07/2020 0946   CL 104 07/07/2020 0946   CO2 25 07/07/2020 0946   GLUCOSE 108 (H) 07/07/2020 0946   GLUCOSE 110 (H) 01/07/2019 0930   BUN 17 07/07/2020 0946   CREATININE 0.93 07/07/2020 0946   CALCIUM 9.4 07/07/2020 0946   GFRNONAA 67 07/07/2020 0946   GFRAA 77 07/07/2020 0946   Lab Results  Component Value Date   HGBA1C 5.2 07/07/2020   HGBA1C 5.3 08/10/2016   Lab Results  Component Value Date   INSULIN  13.9 07/07/2020   INSULIN  38.9 (H) 08/10/2016   Lab Results  Component Value Date   TSH 1.340 07/07/2020   CBC    Component Value Date/Time   WBC 5.5 07/07/2020 0946   WBC 8.8 01/07/2019 0930   RBC 4.83 07/07/2020 0946   RBC 4.54 01/07/2019 0930   HGB 13.5 07/07/2020 0946   HCT 41.1 07/07/2020 0946   PLT 297 07/07/2020 0946   MCV 85 07/07/2020 0946   MCH 28.0 07/07/2020 0946   MCH 28.2 01/07/2019 0930   MCHC 32.8 07/07/2020 0946   MCHC 32.9 01/07/2019 0930   RDW  13.3 07/07/2020 0946   Iron Studies No results found for: "IRON", "TIBC", "FERRITIN", "IRONPCTSAT" Lipid Panel     Component Value Date/Time   CHOL 254 (H) 07/07/2020 0946   TRIG 115 07/07/2020 0946   HDL 60 07/07/2020 0946   LDLCALC 174 (H) 07/07/2020 0946   Hepatic Function Panel     Component Value Date/Time   PROT 6.8 07/07/2020 0946   ALBUMIN 4.5 07/07/2020 0946   AST 19 07/07/2020 0946   ALT 16 07/07/2020 0946   ALKPHOS 88 07/07/2020 0946   BILITOT 0.5 07/07/2020 0946      Component Value Date/Time   TSH 1.340 07/07/2020 0946   Nutritional Lab Results  Component Value Date   VD25OH 51.6 04/18/2023    VD25OH 44.0 07/07/2020   VD25OH 40.1 02/25/2020     Assessment and Plan Assessment & Plan Obesity She adheres to the category two eating plan approximately 50% of the time and has not engaged in physical exercise. Her weight has remained stable over the past month with a one-pound gain. She is currently on Mounjaro , experiencing no hunger issues, but faces challenges with meal planning due to a busy schedule and selling her house. Emotional eating, particularly stress and reward eating, is present. Lack of hunger signals may lead to inadequate nutrition and decreased metabolism, hindering weight loss. Alternative treatments for hunger management, such as metformin  and phentermine, were discussed if GLP-1 is not suitable. - Continue Mounjaro  - Adhere to category two eating plan - Encourage meal planning and availability of healthy food options - Incorporate physical activity, such as walking, into daily routine  Emotional eating behaviors She recognizes her emotional eating behaviors, exacerbated by stress and current life circumstances. She is managing these behaviors with Lexapro  and is mindful of minimizing emotional eating. Meal planning is emphasized to manage these behaviors. - Continue Lexapro  - Emphasize meal planning to manage emotional eating  Vitamin D  deficiency She is well-managed on prescription vitamin D . Plans to refill the prescription to ensure continuity of care. - Refill prescription vitamin D   General Health Maintenance She is considering lifestyle changes, including potentially working with a Systems analyst for exercise accountability. The importance of physical activity and maintaining a balanced diet was discussed. - Consider working with a Systems analyst for exercise accountability  Follow-up Her next appointment is scheduled, and she plans to ensure continuity of care by scheduling future appointments in advance. - Ensure next appointment is scheduled -  Schedule future appointments in advance    She was informed of the importance of frequent follow up visits to maximize her success with intensive lifestyle modifications for her multiple health conditions.    Jasmine Mesi, MD

## 2024-01-22 ENCOUNTER — Other Ambulatory Visit: Payer: Self-pay | Admitting: Obstetrics and Gynecology

## 2024-01-22 DIAGNOSIS — Z1231 Encounter for screening mammogram for malignant neoplasm of breast: Secondary | ICD-10-CM

## 2024-01-30 ENCOUNTER — Ambulatory Visit
Admission: RE | Admit: 2024-01-30 | Discharge: 2024-01-30 | Disposition: A | Discharge status: Home / Self Care | Source: Ambulatory Visit | Attending: Obstetrics and Gynecology | Admitting: Obstetrics and Gynecology

## 2024-01-30 DIAGNOSIS — E782 Mixed hyperlipidemia: Secondary | ICD-10-CM | POA: Diagnosis not present

## 2024-01-30 DIAGNOSIS — E039 Hypothyroidism, unspecified: Secondary | ICD-10-CM | POA: Diagnosis not present

## 2024-01-30 DIAGNOSIS — Z1231 Encounter for screening mammogram for malignant neoplasm of breast: Secondary | ICD-10-CM | POA: Diagnosis not present

## 2024-02-01 ENCOUNTER — Other Ambulatory Visit: Payer: Self-pay

## 2024-02-01 DIAGNOSIS — H43813 Vitreous degeneration, bilateral: Secondary | ICD-10-CM | POA: Diagnosis not present

## 2024-02-01 DIAGNOSIS — H35379 Puckering of macula, unspecified eye: Secondary | ICD-10-CM | POA: Diagnosis not present

## 2024-02-01 DIAGNOSIS — Z961 Presence of intraocular lens: Secondary | ICD-10-CM | POA: Diagnosis not present

## 2024-02-06 ENCOUNTER — Other Ambulatory Visit: Payer: Self-pay

## 2024-02-06 DIAGNOSIS — D2262 Melanocytic nevi of left upper limb, including shoulder: Secondary | ICD-10-CM | POA: Diagnosis not present

## 2024-02-06 DIAGNOSIS — E039 Hypothyroidism, unspecified: Secondary | ICD-10-CM | POA: Diagnosis not present

## 2024-02-06 DIAGNOSIS — F5101 Primary insomnia: Secondary | ICD-10-CM | POA: Diagnosis not present

## 2024-02-06 DIAGNOSIS — E6609 Other obesity due to excess calories: Secondary | ICD-10-CM | POA: Diagnosis not present

## 2024-02-06 DIAGNOSIS — L708 Other acne: Secondary | ICD-10-CM | POA: Diagnosis not present

## 2024-02-06 DIAGNOSIS — E782 Mixed hyperlipidemia: Secondary | ICD-10-CM | POA: Diagnosis not present

## 2024-02-06 DIAGNOSIS — D225 Melanocytic nevi of trunk: Secondary | ICD-10-CM | POA: Diagnosis not present

## 2024-02-06 DIAGNOSIS — D2271 Melanocytic nevi of right lower limb, including hip: Secondary | ICD-10-CM | POA: Diagnosis not present

## 2024-02-06 DIAGNOSIS — G44219 Episodic tension-type headache, not intractable: Secondary | ICD-10-CM | POA: Diagnosis not present

## 2024-02-06 DIAGNOSIS — Z6834 Body mass index (BMI) 34.0-34.9, adult: Secondary | ICD-10-CM | POA: Diagnosis not present

## 2024-02-06 DIAGNOSIS — R238 Other skin changes: Secondary | ICD-10-CM | POA: Diagnosis not present

## 2024-02-06 DIAGNOSIS — E66811 Obesity, class 1: Secondary | ICD-10-CM | POA: Diagnosis not present

## 2024-02-06 DIAGNOSIS — Z85828 Personal history of other malignant neoplasm of skin: Secondary | ICD-10-CM | POA: Diagnosis not present

## 2024-02-06 DIAGNOSIS — B078 Other viral warts: Secondary | ICD-10-CM | POA: Diagnosis not present

## 2024-02-06 DIAGNOSIS — D2261 Melanocytic nevi of right upper limb, including shoulder: Secondary | ICD-10-CM | POA: Diagnosis not present

## 2024-02-06 DIAGNOSIS — L900 Lichen sclerosus et atrophicus: Secondary | ICD-10-CM | POA: Diagnosis not present

## 2024-02-06 MED ORDER — CLOBETASOL PROPIONATE 0.05 % EX OINT
TOPICAL_OINTMENT | CUTANEOUS | 0 refills | Status: AC
  Filled 2024-02-06: qty 30, 30d supply, fill #0

## 2024-02-06 MED ORDER — FLUCONAZOLE 200 MG PO TABS
200.0000 mg | ORAL_TABLET | ORAL | 1 refills | Status: AC
  Filled 2024-02-06: qty 4, 28d supply, fill #0
  Filled 2024-07-09: qty 4, 28d supply, fill #1

## 2024-02-06 MED ORDER — CLOBETASOL PROPIONATE 0.05 % EX OINT
TOPICAL_OINTMENT | CUTANEOUS | 2 refills | Status: AC
  Filled 2024-02-06: qty 30, 30d supply, fill #0

## 2024-02-20 ENCOUNTER — Other Ambulatory Visit: Payer: Self-pay

## 2024-02-20 ENCOUNTER — Encounter (INDEPENDENT_AMBULATORY_CARE_PROVIDER_SITE_OTHER): Payer: Self-pay | Admitting: Family Medicine

## 2024-02-20 ENCOUNTER — Encounter: Payer: Self-pay | Admitting: Neurology

## 2024-02-20 ENCOUNTER — Ambulatory Visit (INDEPENDENT_AMBULATORY_CARE_PROVIDER_SITE_OTHER): Admitting: Neurology

## 2024-02-20 ENCOUNTER — Ambulatory Visit (INDEPENDENT_AMBULATORY_CARE_PROVIDER_SITE_OTHER): Admitting: Family Medicine

## 2024-02-20 VITALS — BP 137/79 | HR 79 | Ht 63.0 in | Wt 196.0 lb

## 2024-02-20 VITALS — BP 111/75 | HR 80 | Temp 98.1°F | Ht 63.0 in | Wt 191.0 lb

## 2024-02-20 DIAGNOSIS — M5481 Occipital neuralgia: Secondary | ICD-10-CM

## 2024-02-20 DIAGNOSIS — M79601 Pain in right arm: Secondary | ICD-10-CM

## 2024-02-20 DIAGNOSIS — Z6841 Body Mass Index (BMI) 40.0 and over, adult: Secondary | ICD-10-CM

## 2024-02-20 DIAGNOSIS — M2480 Other specific joint derangements of unspecified joint, not elsewhere classified: Secondary | ICD-10-CM

## 2024-02-20 DIAGNOSIS — Z6833 Body mass index (BMI) 33.0-33.9, adult: Secondary | ICD-10-CM

## 2024-02-20 DIAGNOSIS — R519 Headache, unspecified: Secondary | ICD-10-CM | POA: Diagnosis not present

## 2024-02-20 DIAGNOSIS — M5412 Radiculopathy, cervical region: Secondary | ICD-10-CM | POA: Diagnosis not present

## 2024-02-20 DIAGNOSIS — R200 Anesthesia of skin: Secondary | ICD-10-CM | POA: Diagnosis not present

## 2024-02-20 DIAGNOSIS — H539 Unspecified visual disturbance: Secondary | ICD-10-CM

## 2024-02-20 DIAGNOSIS — R202 Paresthesia of skin: Secondary | ICD-10-CM

## 2024-02-20 DIAGNOSIS — E785 Hyperlipidemia, unspecified: Secondary | ICD-10-CM

## 2024-02-20 DIAGNOSIS — G8929 Other chronic pain: Secondary | ICD-10-CM

## 2024-02-20 DIAGNOSIS — M5382 Other specified dorsopathies, cervical region: Secondary | ICD-10-CM | POA: Diagnosis not present

## 2024-02-20 DIAGNOSIS — M542 Cervicalgia: Secondary | ICD-10-CM

## 2024-02-20 DIAGNOSIS — R29898 Other symptoms and signs involving the musculoskeletal system: Secondary | ICD-10-CM | POA: Diagnosis not present

## 2024-02-20 DIAGNOSIS — M79602 Pain in left arm: Secondary | ICD-10-CM

## 2024-02-20 DIAGNOSIS — E669 Obesity, unspecified: Secondary | ICD-10-CM

## 2024-02-20 DIAGNOSIS — E782 Mixed hyperlipidemia: Secondary | ICD-10-CM

## 2024-02-20 MED ORDER — METHYLPREDNISOLONE 4 MG PO TBPK
ORAL_TABLET | ORAL | 1 refills | Status: DC
  Filled 2024-02-20: qty 21, 6d supply, fill #0
  Filled 2024-03-11: qty 21, 6d supply, fill #1

## 2024-02-20 MED ORDER — GABAPENTIN 100 MG PO CAPS
100.0000 mg | ORAL_CAPSULE | Freq: Three times a day (TID) | ORAL | 3 refills | Status: AC | PRN
  Filled 2024-02-20: qty 90, 30d supply, fill #0
  Filled 2024-05-25: qty 90, 30d supply, fill #1

## 2024-02-20 NOTE — Progress Notes (Unsigned)
 [facility]    Provider:  Dr Ines Requesting Provider: Alla Amis, MD Primary Care Provider:  Alla Amis, MD  CC:  headaches  HPI:  Katherine Smith is a 63 y.o. female here as requested by Louann Penton MD for headaches. has Insulin  resistance; Vitamin D  deficiency; Hyperlipidemia; Morbid obesity (HCC); Hashimoto's thyroiditis; Sleep apnea; BMI 33.0-33.9,adult; Essential hypertension; Prediabetes; Elevated liver function tests; B12 nutritional deficiency; Class 3 severe obesity with serious comorbidity and body mass index (BMI) of 40.0 to 44.9 in adult; Depression; Acquired hypothyroidism; GAD (generalized anxiety disorder); GERD (gastroesophageal reflux disease); Lichen sclerosus; Primary insomnia; Obesity, Beginning BMI 42.51; Polyphagia; Stress; and Chronic intractable headache on their problem list.  She has 2 different types of headache, she has occipital neuralgia on the right, with neck movement shooting pain up the back of the right head, brief and sharp, can be a few times a day maybe more often, worse with head turning or neck flexion with soreness in between the sharp pain and pain on palpation at the emergence of the right occipital nerve, also chronic neck pain, + arm pain, + cervical radicular symptoms + numbness/tingling in to the arms. The second type of headache is a migraine, not very frequent, they can be triggered by sun she will get halos around her vision in the sun, she can go into a dark room, she lays down in a quiet dark room that's cool which helps, followed by a headache, the only time she has noticed the halos is when working on the computer as well, the halos may be in both eyes, she is a pharmacist, vision changes before the head pain but followed by pain on both sides of the head, moderate and throbbing, light and sound sensitivity, mild nausea, hurts to move wants to lay still, she has migraines that can last 8-24 hours untreated, at  one point her daughter was diagnosed with abdominal migraines but otherwise no other Fhx of migraines. The neck pain can be significant, using ice, there is some crepitus when turning the neck, decreased range of motion in the neck. She had a bad fall with neck trauma about a year ago and the neck pain worsened she fell down the stairs. Not sleeping well because of the neck pain. Husband is a [place] but he is in [place]  and maneuvers used to help. + vision changes as above. No jaw pain or other signs/symptoms of GCA.    Reviewed notes, labs and imaging from outside physicians, which showed:  Per Dr. Penton 10/2023: She experiences occipital headaches, which she attributes to stress. These headaches sometimes radiate to the right side of her head and are affecting her sleep   From a thorough review of records and patient report, Medications tried that can be used in migraine/headache management greater than 3 months include: Lifestyle modification, headache diaries, better sleep hygiene, exercise, management of migraine triggers, OTC and prescribed analgesics/nsaids such as ibuprofen, excedrin, alleve and others, baclofen , lexapro , lisinopril ,zofran ,phenergan    01/30/2024: CBC, TSH nml 07/31/2023: cmp nml  01/07/2019: CT head:  Narrative & Impression  CLINICAL DATA:  Headache   EXAM: CT HEAD WITHOUT CONTRAST   TECHNIQUE: Contiguous axial images were obtained from the base of the skull through the vertex without intravenous contrast.   COMPARISON:  None.   FINDINGS: Brain: No acute intracranial abnormality. Specifically, no hemorrhage, hydrocephalus, mass lesion, acute infarction, or significant intracranial injury.   Vascular: No hyperdense vessel or unexpected calcification.   Skull: No acute  calvarial abnormality.   Sinuses/Orbits: Visualized paranasal sinuses and mastoids clear. Orbital soft tissues unremarkable.   Other: None   IMPRESSION: Normal study.   Review of  Systems: Patient complains of symptoms per HPI as well as the following symptoms per hpi. Pertinent negatives and positives per HPI. All others negative.   Social History   Socioeconomic History   Marital status: Married    Spouse name: Arden Axon   Number of children: Not on file   Years of education: Not on file   Highest education level: Not on file  Occupational History   Occupation: Printmaker: [employer]  Tobacco Use   Smoking status: Never   Smokeless tobacco: Never  Vaping Use   Vaping status: Never Used  Substance and Sexual Activity   Alcohol use: No   Drug use: Never   Sexual activity: Yes  Other Topics Concern   Not on file  Social History Narrative   Caffiene 1 diet coke in am.   Work as Administrator, arts at [employer] [place]   Lives separately ( husband in [place] ) he is retired.  Pets no.    Social Drivers of Corporate investment banker Strain: Low Risk  (03/28/2023)   Received from [facility]   Overall Financial Resource Strain (CARDIA)    Difficulty of Paying Living Expenses: Not hard at all  Food Insecurity: No Food Insecurity (03/28/2023)   [facility] from [facility]   Hunger Vital Sign    Within the past 12 months, you worried that your food would run out before you got the money to buy more.: Never true    Within the past 12 months, the food you bought just didn't last and you didn't have money to get more.: Never true  Transportation Needs: No Transportation Needs (03/28/2023)   Received [facility] - Transportation    In the past 12 months, has lack of transportation kept you from medical appointments or from getting medications?: No    Lack of Transportation (Non-Medical): No  Physical Activity: Not on file  Stress: Not on file  Social Connections: Not on file  Intimate Partner Violence: Not on file    Family History  Problem Relation Age of Onset   Hypertension  Mother    Hyperlipidemia Mother    COPD Mother    Hypertension Father    Breast cancer Neg Hx    Migraines Neg Hx     Past Medical History:  Diagnosis Date   Anxiety    Back pain    Cancer (HCC)    BASAL CELL SKIN CANCER   Depression    Fatty liver    GERD (gastroesophageal reflux disease)    Hashimoto's thyroiditis    HTN (hypertension)    Hyperlipidemia    Hypothyroidism    IBS (irritable bowel syndrome)    Insomnia    Joint pain    Lactose intolerance    Lichen sclerosus    Obesity    Psoriasis    Sleep apnea     Patient Active Problem List   Diagnosis Date Noted   Chronic intractable headache 11/12/2023   Stress 02/14/2023   Polyphagia 10/11/2022   Obesity, Beginning BMI 42.51 09/06/2022   Acquired hypothyroidism 08/29/2022   GERD (gastroesophageal reflux disease) 08/29/2022   Lichen sclerosus 08/29/2022   GAD (generalized anxiety disorder) 09/01/2020   Depression 01/08/2020   Primary insomnia 07/31/2018   Class  3 severe obesity with serious comorbidity and body mass index (BMI) of 40.0 to 44.9 in adult 02/07/2017   Elevated liver function tests 11/23/2016   B12 nutritional deficiency 11/23/2016   Essential hypertension 10/25/2016   Prediabetes 10/25/2016   BMI 33.0-33.9,adult 09/26/2016   Hashimoto's thyroiditis 09/07/2016   Sleep apnea 09/07/2016   Insulin  resistance 08/24/2016   Vitamin D  deficiency 08/24/2016   Hyperlipidemia 08/24/2016   Morbid obesity (HCC) 08/24/2016    Past Surgical History:  Procedure Laterality Date   ANTERIOR DISC ARTHROPLASTY     2004   CATARACT EXTRACTION W/ INTRAOCULAR LENS IMPLANT Bilateral    COLONOSCOPY  12/18/2011   COLONOSCOPY N/A 11/29/2022   Procedure: COLONOSCOPY;  Surgeon: Toledo, Ladell POUR, MD;  Location: [facility] END[facility]COPY;  Service: Gastroenterology;  Laterality: N/A;  DM   COLONOSCOPY WITH PROPOFOL  N/A 04/04/2017   Procedure: COLONOSCOPY WITH PROPOFOL ;  Surgeon: Viktoria Lamar DASEN, MD;  Location: [facility];  Service: Endoscopy;  Laterality: N/A;   SHOULDER ARTHROSCOPY WITH ROTATOR CUFF REPAIR AND SUBACROMIAL DECOMPRESSION Left 07/12/2020   Procedure: SHOULDER ARTHROSCOPY WITH ARTHROSCOPIC  ROTATOR CUFF REPAIR AND SUBACROMIAL DECOMPRESSION;  Surgeon: Dozier Soulier, MD;  Location: [facility];  Service: Orthopedics;  Laterality: Left;    Current Outpatient Medications  Medication Sig Dispense Refill   ALPRAZolam  (XANAX ) 0.25 MG tablet Take 1-2 tabs (0.25mg -0.50mg ) 30-60 minutes before procedure. May repeat if needed.Do not drive. 4 tablet 0   clobetasol  ointment (TEMOVATE ) 0.05 % Apply 1 Application topically 2 (two) times daily for flares and taper to three times weekly for maintenance 30 g 2   clobetasol  ointment (TEMOVATE ) 0.05 % Apply to affected areas twice daily for flares and taper to three times weekly for maintenance 30 g 2   clobetasol  ointment (TEMOVATE ) 0.05 % Apply twice daily to affected areas while flared, then twice weekly as maintenance. 60 g 0   escitalopram  (LEXAPRO ) 20 MG tablet Take 1 tablet (20 mg total) by mouth every morning. 90 tablet 0   estradiol  (ESTRACE ) 0.1 MG/GM vaginal cream Place 1 g (1/4 applicatorful) vaginally daily for 2 weeks, every other day for 2 weeks, then twice a week as needed. 42.5 g 2   ezetimibe  (ZETIA ) 10 MG tablet Take 1 tablet (10 mg total) by mouth daily. 100 tablet 1   gabapentin  (NEURONTIN ) 100 MG capsule Take 1 capsule (100 mg total) by mouth 3 (three) times daily as needed. 90 capsule 3   levothyroxine  (SYNTHROID ) 112 MCG tablet Take 1 tablet (112 mcg total) by mouth daily. TAKE ON AN EMPTY STOMACH WITH A GLASS OF WATER AT LEAST 30-60 MINUTES BEFORE BREAKFAST. 100 tablet 1   methylPREDNISolone  (MEDROL  DOSEPAK) 4 MG TBPK tablet Take pills daily all together with food. Take the first dose (6 pills) as soon as possible. Take the rest each morning. For 6 days total 6-5-4-3-2-1. 21 tablet 1   Multiple Vitamins-Minerals (HAIR  SKIN & NAILS ADVANCED PO) Take 1 tablet by mouth daily.     pantoprazole  (PROTONIX ) 40 MG tablet Take 1 tablet (40 mg total) by mouth daily. 100 tablet 1   tirzepatide  (ZEPBOUND ) 15 MG/0.5ML Pen Inject 15 mg into the skin once a week. 6 mL 0   Vitamin D , Ergocalciferol , (DRISDOL ) 1.25 MG (50000 UNIT) CAPS capsule Take 1 capsule (50,000 Units total) by mouth every 7 (seven) days. 12 capsule 0   No current facility-administered medications for this visit.    Allergies as of 02/20/2024 - Review Complete 02/20/2024  Allergen  Reaction Noted   Atorvastatin  04/23/2014   Dexfenfluramine  04/23/2014    Vitals: BP 137/79 (Cuff Size: Large)   Pulse 79   Ht 5' 3 (1.6 m)   Wt 196 lb (88.9 kg)   LMP 04/14/2016   BMI 34.72 kg/m  Last Weight:  Wt Readings from Last 1 Encounters:  02/20/24 191 lb (86.6 kg)   Last Height:   Ht Readings from Last 1 Encounters:  02/20/24 5' 3 (1.6 m)     Physical exam: Exam: Gen: NAD, conversant, well nourised, obese, well groomed                     CV: RRR, no MRG. No Carotid Bruits. No peripheral edema, warm, nontender Eyes: Conjunctivae clear without exudates or hemorrhage MSK: tight cervical muscles  Neuro: Detailed Neurologic Exam  Speech:    Speech is normal; fluent and spontaneous with normal comprehension.  Cognition:    The patient is oriented to person, place, and time;     recent and remote memory intact;     language fluent;     normal attention, concentration,     fund of knowledge Cranial Nerves:    The pupils are equal, round, and reactive to light. The fundi are normal and spontaneous venous pulsations are present. Visual fields are full to finger confrontation. Extraocular movements are intact. Trigeminal sensation is intact and the muscles of mastication are normal. The face is symmetric. The palate elevates in the midline. Hearing intact. Voice is normal. Shoulder shrug is normal. The tongue has normal motion without  fasciculations.   Coordination:    Normal finger to nose and heel to shin. Normal rapid alternating movements.   Gait:    Heel-toe and tandem gait are normal.   Motor Observation:    No asymmetry, no atrophy, and no involuntary movements noted. Tone:    Normal muscle tone.    Posture:    Posture is normal. normal erect    Strength:slight prox weakness arms otherwise strength is V/V in the upper and lower limbs.      Sensation: intact to LT     Reflex Exam:  DTR's:    Deep tendon reflexes in the upper and lower extremities are normal bilaterally.   Toes:    The toes are downgoing bilaterally.   Clonus:    Clonus is absent.    Assessment/Plan:  63 year old female with occipital neuralgia and neck pain with movement. Neuro exam does show weakness in the arms and she endorses radicular symptoms, has decreased range of motion in the neck.. Pain is unilateral and is located in the distribution of the greater, lesser and/or third occipital nerves, paroxysmal and brief, painful, sharp, with tenderness and trigger points at the emergence of the greater occipital nerve. Differential includes pain in the upper cervical joints or disks, suboccipital or upper posterior neck muscles including the traps/scm, spinal and posterior cranial fossa dura mater, vertebral arteries, structural and infiltrative lesions such as meningioma, schwannoma, myelitis, compressive disk disease and others. Will need MRI of the brain and cervical spine to evaluate for surgical or interventional options (ESI) . Also having headaches, worsening in severity nd freq, pain behind the right eye and vision changes need mri brain to look for space occupying mass, chiari or intracranial hypertension (pseudotumor), strokes, malignancies, vasculidities, demyelination(multiple sclerosis) or other    Options discussed:  - MRI of the cervical spine and MRi brain w/wo contrast- Will need MRI of  the brain as above and mri cervical  spine to evaluate for surgical or interventional options (ESI) . Will prescribe Xanax .  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 Please evaluate and treat including dry needling, stretching, strengthening, manual therapy/massage, heating, TENS unit, exercising for scapular stabilization, pectoral stretching and rhomboid strengthening as clinically warranted as well as any other modality as recommended by evaluation. -More invasive options: Epidural Steroid Injections or medial branch blocks for neck pain and occipital neuralgia in the future as needed. Gamma knife or radiofequency Ablation - these are invasive procedures unlike nerve blocks which are more superficial -Differential: Occipital neuralgia, Cervical radiculopathy or arthritis in the neck or muscle spasms in the neck on the right - Migraines: will proceed with above and then follow up        Occipital neuralgia  Occipital Neuralgia   Orders Placed This Encounter  Procedures   MR BRAIN W WO CONTRAST   MR CERVICAL SPINE WO CONTRAST   Meds ordered this encounter  Medications   methylPREDNISolone  (MEDROL  DOSEPAK) 4 MG TBPK tablet    Sig: Take pills daily all together with food. Take the first dose (6 pills) as soon as possible. Take the rest each morning. For 6 days total 6-5-4-3-2-1.    Dispense:  21 tablet    Refill:  1   gabapentin  (NEURONTIN ) 100 MG capsule    Sig: Take 1 capsule (100 mg total) by mouth 3 (three) times daily as needed.    Dispense:  90 capsule    Refill:  3   ALPRAZolam  (XANAX ) 0.25 MG tablet    Sig: Take 1-2 tabs  (0.25mg -0.50mg ) 30-60 minutes before procedure. May repeat if needed.Do not drive.    Dispense:  4 tablet    Refill:  0    Cc: Alla Amis, MD,  Alla Amis, MD  Onetha Epp, MD  [facility] [address] [place], [place] [address]  Phone [phone] Fax [phone]

## 2024-02-20 NOTE — Patient Instructions (Addendum)
 Options discussed:  - MRI of the cervical spine and MRi brain w/wo contrast- Will need MRI of the cervical spine to evaluate for surgical or interventional options (ESI) . Will prescribe Xanax .  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 Please evaluate and treat including dry needling, stretching, strengthening, manual therapy/massage, heating, TENS unit, exercising for scapular stabilization, pectoral stretching and rhomboid strengthening as clinically warranted as well as any other modality as recommended by evaluation. -More invasive options: Epidural Steroid Injections or medial branch blocks for neck pain and occipital neuralgia in the future as needed. Gamma knife or radiofequency Ablation - these are invasive procedures unlike nerve blocks which are more superficial -Differential: Occipital neuralgia, Cervical radiculopathy or arthritis in the neck or muscle spasms in the neck on the right - Migraines: will proceed with above and then follow up        Occipital neuralgia  Occipital Neuralgia  Occipital neuralgia is a type of headache that causes brief episodes of very bad pain in the back of the head. Pain from occipital neuralgia may spread (radiate) to other parts of the head. These headaches may be caused by irritation of the nerves that leave the spinal cord high up in the neck, just below the base of the skull (occipital nerves). The occipital nerves transmit sensations from the back of the head, the top of the head, and the areas behind the ears. What are the causes? This condition can occur  without any known cause (primary headache syndrome). In other cases, this condition is caused by pressure on or irritation of one of the two occipital nerves. Pressure and irritation may be due to: Muscle spasm in the neck. Neck injury. Wear and tear of the vertebrae in the neck (osteoarthritis). Disease of the disks that separate the vertebrae. Swollen blood vessels that put pressure on the occipital nerves. Infections. Tumors. Diabetes. What are the signs or symptoms? This condition causes brief burning, stabbing, electric, shocking, or shooting pain in the back of the head that can radiate to the top of the head. It can happen on one side or both sides of the head. It can also cause: Pain behind the eye. Pain triggered by neck movement or hair brushing. Scalp tenderness. Aching in the back of the head between episodes of very bad pain. Pain that gets worse with exposure to bright lights. How is this diagnosed? Your health care provider may diagnose the condition based on a physical exam and your symptoms. Tests may be done, such as: Imaging studies of the brain and neck (cervical spine), such as an MRI or CT scan. These look for causes of pinched nerves. Applying pressure to the nerves in the neck to try to re-create the pain. Injection of numbing medicine into the occipital nerve areas to see if pain goes away (diagnostic nerve block). How is this treated? Treatment for this condition may begin with simple measures, such as: Rest. Massage. Applying heat or cold to the area. Over-the-counter pain relievers. If these measures do not work, you may need other treatments, including: Medicines, such as: Prescription-strength anti-inflammatory medicines. Muscle relaxants. Anti-seizure medicines, which can relieve pain. Antidepressants, which can relieve  pain. Injected medicines, such as medicines that numb the area (local anesthetic) and steroids. Pulsed radiofrequency ablation. This  is when wires are implanted to deliver electrical impulses that block pain signals from the occipital nerve. Surgery to relieve nerve pressure. Physical therapy. Follow these instructions at home: Managing pain     Avoid any activities that cause pain. Rest when you have an attack of pain. Try gentle massage to relieve pain. Try a different pillow or sleeping position. If directed, apply heat to the affected area as often as told by your health care provider. Use the heat source that your health care provider recommends, such as a moist heat pack or a heating pad. Place a towel between your skin and the heat source. Leave the heat on for 20-30 minutes. Remove the heat if your skin turns bright red. This is especially important if you are unable to feel pain, heat, or cold. You have a greater risk of getting burned. If directed, put ice on the back of your head and neck area. To do this: Put ice in a plastic bag. Place a towel between your skin and the bag. Leave the ice on for 20 minutes, 2-3 times a day. Remove the ice if your skin turns bright red. This is very important. If you cannot feel pain, heat, or cold, you have a greater risk of damage to the area. General instructions Take over-the-counter and prescription medicines only as told by your health care provider. Avoid things that make your symptoms worse, such as bright lights. Try to stay active. Get regular exercise that does not cause pain. Ask your health care provider to suggest safe exercises for you. Work with a physical therapist to learn stretching exercises you can do at home. Practice good posture. Keep all follow-up visits. This is important. Contact a health care provider if: Your medicine is not working. You have new or worsening symptoms. Get help right away if: You have very bad head pain that does not go away. You have a sudden change in vision, balance, or speech. These symptoms may represent a serious  problem that is an emergency. Do not wait to see if the symptoms will go away. Get medical help right away. Call your local emergency services (911 in the [place] Do not drive yourself to the hospital. Summary Occipital neuralgia is a type of headache that causes brief episodes of very bad pain in the back of the head. Pain from occipital neuralgia may spread (radiate) to other parts of the head. Treatment for this condition includes rest, massage, and medicines. This information is not intended to replace advice given to you by your health care provider. Make sure you discuss any questions you have with your health care provider. Document Revised: 05/30/2020 Document Reviewed: 05/30/2020 Elsevier Patient Education  2023 [employer]  Gabapentin  Capsules or Tablets What is this medication? GABAPENTIN  (GA ba pen tin) treats nerve pain. It may also be used to prevent and control seizures in people with epilepsy. It works by calming overactive nerves in your body. This medicine may be used for other purposes; ask your health care provider or pharmacist if you have questions. COMMON BRAND NAME(S): Active-PAC with Gabapentin , Gabarone , Gralise , Neurontin  What should I tell my care team before I take this medication? They need to know if you have any of these conditions: Alcohol or substance use disorder Kidney disease Lung or breathing disease Suicidal thoughts, plans, or attempt; a previous suicide attempt by you or a  family member An unusual or allergic reaction to gabapentin , other medications, foods, dyes, or preservatives Pregnant or trying to get pregnant Breast-feeding How should I use this medication? Take this medication by mouth with a glass of water. Follow the directions on the prescription label. You can take it with or without food. If it upsets your stomach, take it with food. Take your medication at regular intervals. Do not take it more often than directed. Do not stop taking  except on your care team's advice. If you are directed to break the 600 or 800 mg tablets in half as part of your dose, the extra half tablet should be used for the next dose. If you have not used the extra half tablet within 28 days, it should be thrown away. A special MedGuide will be given to you by the pharmacist with each prescription and refill. Be sure to read this information carefully each time. Talk to your care team about the use of this medication in children. While this medication may be prescribed for children as young as 3 years for selected conditions, precautions do apply. Overdosage: If you think you have taken too much of this medicine contact a poison control center or emergency room at once. NOTE: This medicine is only for you. Do not share this medicine with others. What if I miss a dose? If you miss a dose, take it as soon as you can. If it is almost time for your next dose, take only that dose. Do not take double or extra doses. What may interact with this medication? Alcohol Antihistamines for allergy, cough, and cold Certain medications for anxiety or sleep Certain medications for depression like amitriptyline, fluoxetine, sertraline Certain medications for seizures like phenobarbital, primidone Certain medications for stomach problems General anesthetics like halothane, isoflurane, methoxyflurane, propofol  Local anesthetics like lidocaine , pramoxine, tetracaine Medications that relax muscles for surgery Opioid medications for pain Phenothiazines like chlorpromazine, mesoridazine, prochlorperazine, thioridazine This list may not describe all possible interactions. Give your health care provider a list of all the medicines, herbs, non-prescription drugs, or dietary supplements you use. Also tell them if you smoke, drink alcohol, or use illegal drugs. Some items may interact with your medicine. What should I watch for while using this medication? Visit your care team for  regular checks on your progress. You may want to keep a record at home of how you feel your condition is responding to treatment. You may want to share this information with your care team at each visit. You should contact your care team if your seizures get worse or if you have any new types of seizures. Do not stop taking this medication or any of your seizure medications unless instructed by your care team. Stopping your medication suddenly can increase your seizures or their severity. This medication may cause serious skin reactions. They can happen weeks to months after starting the medication. Contact your care team right away if you notice fevers or flu-like symptoms with a rash. The rash may be red or purple and then turn into blisters or peeling of the skin. Or, you might notice a red rash with swelling of the face, lips or lymph nodes in your neck or under your arms. Wear a medical identification bracelet or chain if you are taking this medication for seizures. Carry a card that lists all your medications. This medication may affect your coordination, reaction time, or judgment. Do not drive or operate machinery until you know how this medication affects  you. Sit up or stand slowly to reduce the risk of dizzy or fainting spells. Drinking alcohol with this medication can increase the risk of these side effects. Your mouth may get dry. Chewing sugarless gum or sucking hard candy, and drinking plenty of water may help. Watch for new or worsening thoughts of suicide or depression. This includes sudden changes in mood, behaviors, or thoughts. These changes can happen at any time but are more common in the beginning of treatment or after a change in dose. Call your care team right away if you experience these thoughts or worsening depression. If you become pregnant while using this medication, you may enroll in the [place] [employer] Antiepileptic Drug Pregnancy Registry by calling [phone]. This  registry collects information about the safety of antiepileptic medication use during pregnancy. What side effects may I notice from receiving this medication? Side effects that you should report to your care team as soon as possible: Allergic reactions or angioedema--skin rash, itching, hives, swelling of the face, eyes, lips, tongue, arms, or legs, trouble swallowing or breathing Rash, fever, and swollen lymph nodes Thoughts of suicide or self harm, worsening mood, feelings of depression Trouble breathing Unusual changes in mood or behavior in children after use such as difficulty concentrating, hostility, or restlessness Side effects that usually do not require medical attention (report to your care team if they continue or are bothersome): Dizziness Drowsiness Nausea Swelling of ankles, feet, or hands Vomiting This list may not describe all possible side effects. Call your doctor for medical advice about side effects. You may report side effects to FDA at 1-800-FDA-1088. Where should I keep my medication? Keep out of reach of children and pets. Store at room temperature between 15 and 30 degrees C (59 and 86 degrees F). Get rid of any unused medication after the expiration date. This medication may cause accidental overdose and death if taken by other adults, children, or pets. To get rid of medications that are no longer needed or have expired: Take the medication to a medication take-back program. Check with your pharmacy or law enforcement to find a location. If you cannot return the medication, check the label or package insert to see if the medication should be thrown out in the garbage or flushed down the toilet. If you are not sure, ask your care team. If it is safe to put it in the trash, empty the medication out of the container. Mix the medication with cat litter, dirt, coffee grounds, or other unwanted substance. Seal the mixture in a bag or container. Put it in the trash. NOTE:  This sheet is a summary. It may not cover all possible information. If you have questions about this medicine, talk to your doctor, pharmacist, or health care provider.  2023 Elsevier/Gold Standard (2021-01-31 00:00:00) Methylprednisolone  Tablets What is this medication? METHYLPREDNISOLONE  (meth ill pred NISS oh lone) treats many conditions such as asthma, allergic reactions, arthritis, inflammatory bowel diseases, adrenal, and blood or bone marrow disorders. It works by decreasing inflammation, slowing down an overactive immune system, or replacing cortisol normally made in the body. Cortisol is a hormone that plays an important role in how the body responds to stress, illness, and injury. It belongs to a group of medications called steroids. This medicine may be used for other purposes; ask your health care provider or pharmacist if you have questions. COMMON BRAND NAME(S): Medrol , Medrol  Dosepak What should I tell my care team before I take this medication? They need to know  if you have any of these conditions: Cushing's syndrome Eye disease, vision problems Diabetes Glaucoma Heart disease High blood pressure Infection especially a viral infection, such as chickenpox, cold sores, or herpes Liver disease Mental health conditions Myasthenia gravis Osteoporosis Recent or upcoming vaccine Seizures Stomach or intestine problems Thyroid  disease An unusual or allergic reaction to lactose, methylprednisolone , other medications, foods, dyes, or preservatives Pregnant or trying to get pregnant Breastfeeding How should I use this medication? Take this medication by mouth with a glass of water. Follow the directions on the prescription label. Take this medication with food. If you are taking this medication once a day, take it in the morning. Do not take it more often than directed. Do not suddenly stop taking your medication because you may develop a severe reaction. Your care team will tell you  how much medication to take. If your care team wants you to stop the medication, the dose may be slowly lowered over time to avoid any side effects. Talk to your care team about the use of this medication in children. Special care may be needed. Overdosage: If you think you have taken too much of this medicine contact a poison control center or emergency room at once. NOTE: This medicine is only for you. Do not share this medicine with others. What if I miss a dose? If you miss a dose, take it as soon as you can. If it is almost time for your next dose, talk to your care team. You may need to miss a dose or take an extra dose. Do not take double or extra doses without advice. What may interact with this medication? Do not take this medication with any of the following: Alefacept Echinacea Live virus vaccines Metyrapone Mifepristone This medication may also interact with the following: Amphotericin B Aspirin and aspirin-like medications Certain antibiotics, such as erythromycin, clarithromycin, troleandomycin Certain medications for diabetes Certain medications for fungal infections, such as ketoconazole Certain medications for seizures, such as carbamazepine, phenobarbital, phenytoin Certain medications that treat or prevent blood clots, such as warfarin Cholestyramine Cyclosporine Digoxin Diuretics Estrogen or progestin hormones Isoniazid NSAIDs, medications for pain and inflammation, such as ibuprofen or naproxen Other medications for myasthenia gravis Rifampin Vaccines This list may not describe all possible interactions. Give your health care provider a list of all the medicines, herbs, non-prescription drugs, or dietary supplements you use. Also tell them if you smoke, drink alcohol, or use illegal drugs. Some items may interact with your medicine. What should I watch for while using this medication? Tell your care team if your symptoms do not start to get better or if they get  worse. Do not stop taking except on your care team's advice. You may develop a severe reaction. Your care team will tell you how much medication to take. This medication may increase your risk of getting an infection. Tell your care team if you are around anyone with measles or chickenpox, or if you develop sores or blisters that do not heal properly. This medication may increase blood sugar levels. Ask your care team if changes in diet or medications are needed if you have diabetes. Tell your care team right away if you have any change in your eyesight. Using this medication for a long time may increase your risk of low bone mass. Talk to your care team about bone health. What side effects may I notice from receiving this medication? Side effects that you should report to your care team as soon as  possible: Allergic reactions--skin rash, itching, hives, swelling of the face, lips, tongue, or throat Cushing syndrome--increased fat around the midsection, upper back, neck, or face, pink or purple stretch marks on the skin, thinning, fragile skin that easily bruises, unexpected hair growth High blood sugar (hyperglycemia)--increased thirst or amount of urine, unusual weakness or fatigue, blurry vision Increase in blood pressure Infection--fever, chills, cough, sore throat, wounds that don't heal, pain or trouble when passing urine, general feeling of discomfort or being unwell Low adrenal gland function--nausea, vomiting, loss of appetite, unusual weakness or fatigue, dizziness Mood and behavior changes--anxiety, nervousness, confusion, hallucinations, irritability, hostility, thoughts of suicide or self-harm, worsening mood, feelings of depression Stomach bleeding--bloody or black, tar-like stools, vomiting blood or brown material that looks like coffee grounds Swelling of the ankles, hands, or feet Side effects that usually do not require medical attention (report to your care team if they continue or  are bothersome): Acne General discomfort and fatigue Headache Increase in appetite Nausea Trouble sleeping Weight gain This list may not describe all possible side effects. Call your doctor for medical advice about side effects. You may report side effects to FDA at 1-800-FDA-1088. Where should I keep my medication? Keep out of the reach of children and pets. Store at room temperature between 20 and 25 degrees C (68 and 77 degrees F). Throw away any unused medication after the expiration date. NOTE: This sheet is a summary. It may not cover all possible information. If you have questions about this medicine, talk to your doctor, pharmacist, or health care provider.  2023 Elsevier/Gold Standard (2020-10-26 00:00:00) Lidocaine  Patches What is this medication? LIDOCAINE  (LYE doe kane) treats pain. It works by numbing a specific area of the body, which blocks pain signals going to the brain. It belongs to a group of medications called local anesthetics. This medicine may be used for other purposes; ask your health care provider or pharmacist if you have questions. COMMON BRAND NAME(S): Aspercreme with Lidocaine , Blue-Emu, DERMALID, GEN7T, Lidocan, Lidocare, Lidoderm , Lidofore, LidoReal-30, Lidozo, Salonpas Lidocaine , Xyliderm , ZTlido  What should I tell my care team before I take this medication? They need to know if you have any of these conditions: G6PD deficiency Heart disease Kidney disease Liver disease Skin conditions or sensitivity An unusual or allergic reaction to lidocaine , parabens, other medications, foods, dyes, or preservatives Pregnant or trying to get pregnant Breast-feeding How should I use this medication? This medication is for external use only. Use it as directed on the label. Do not apply to burned or damaged skin. Do not use it more often than directed. Keep using it unless your care team tells you to stop. Talk to your care team about the use of this medication in  children. Special care may be needed. Overdosage: If you think you have taken too much of this medicine contact a poison control center or emergency room at once. NOTE: This medicine is only for you. Do not share this medicine with others. What if I miss a dose? If you miss a dose, use it as soon as you can. If it is almost time for your next dose, use only that dose. Do not use double or extra doses. What may interact with this medication? This medication may interact with the following: Acetaminophen  Certain antibiotics, such as dapsone, nitrofurantoin, aminosalicylic acid, sulfasalazine Certain medications for seizures, such as phenobarbital, phenytoin, valproic acid Chloroquine Cyclophosphamide Dofetilide Flutamide Hydroxyurea Ifosfamide Local anesthetics, such as lidocaine , pramoxine, tetracaine MAOIs, such as Carbex, Eldepryl, Marplan,  Nardil, and Parnate Metoclopramide Moricizine Nitroglycerin Primaquine Saquinavir Quinine This list may not describe all possible interactions. Give your health care provider a list of all the medicines, herbs, non-prescription drugs, or dietary supplements you use. Also tell them if you smoke, drink alcohol, or use illegal drugs. Some items may interact with your medicine. What should I watch for while using this medication? Visit your care team for regular checks on your progress. Tell your care team if your symptoms do not start to get better or if they get worse. Be careful to avoid injury while the area is numb, and you are not aware of pain. If you are going to need surgery, an MRI, CT scan, or other procedure, tell your care team that you are using this medication. You may need to remove the patch before the procedure. What side effects may I notice from receiving this medication? Side effects that you should report to your care team as soon as possible: Allergic reactions--skin rash, itching, hives, swelling of the face, lips, tongue, or  throat Headache, unusual weakness or fatigue, shortness of breath, nausea, vomiting, rapid heartbeat, blue skin or lips, which may be signs of methemoglobinemia Heart rhythm changes--fast or irregular heartbeat, dizziness, feeling faint or lightheaded, chest pain, trouble breathing Side effects that usually do not require medical attention (report to your care team if they continue or are bothersome): Change in skin color Irritation at application site This list may not describe all possible side effects. Call your doctor for medical advice about side effects. You may report side effects to FDA at 1-800-FDA-1088. Where should I keep my medication? Keep out of the reach of children and pets. See product for storage information. Each product may have different instructions. Get rid of any unused medication after the expiration date. To get rid of medications that are no longer needed or have expired: Take the medication to a medication take-back program. Check with your pharmacy or law enforcement to find a location. If you cannot return the medication, ask your pharmacist or care team how to get rid of this medication safely. NOTE: This sheet is a summary. It may not cover all possible information. If you have questions about this medicine, talk to your doctor, pharmacist, or health care provider.  2023 Elsevier/Gold Standard (2021-08-19 00:00:00)

## 2024-02-20 NOTE — Progress Notes (Signed)
 Office: [phone]  /  Fax: [phone]  WEIGHT SUMMARY AND BIOMETRICS  Anthropometric Measurements Height: 5' 3 (1.6 m) Weight: 191 lb (86.6 kg) BMI (Calculated): 33.84 Weight at Last Visit: 191 lb Weight Lost Since Last Visit: 0 Weight Gained Since Last Visit: 0 Starting Weight: 240 lb Total Weight Loss (lbs): 49 lb (22.2 kg) Peak Weight: 255 lb   Body Composition  Body Fat %: 41.7 % Fat Mass (lbs): 80 lbs Muscle Mass (lbs): 106.2 lbs Total Body Water (lbs): 74.6 lbs Visceral Fat Rating : 12   Other Clinical Data Fasting: no Labs: no Today's Visit #: 8 Starting Date: 08/10/16    Chief Complaint: OBESITY   History of Present Illness Katherine Smith is a 63 year old female who presents for a follow-up on her obesity treatment plan.  She has maintained her weight over the last month, adhering to a category two eating plan about fifty percent of the time. She has not been exercising due to time constraints and responsibilities caring for her mother, who is recovering from a hip fracture. Her hunger levels have been stable, but she has not been meal planning or prepping due to time constraints. Frequent visits to assist her mother have impacted her ability to maintain her own routine.  She is experiencing symptoms suggestive of occipital neuralgia and is scheduled for an MRI. She has been prescribed a Medrol  Dosepak and gabapentin , which she plans to take at bedtime. She notes that gabapentin  is considered a weight-gaining drug, but she is willing to try it to manage her symptoms.  She has not been engaging in her previous exercise routine, 'Walk Fit,' due to not feeling well and being overtired from caring for her mother. She wants to resume this activity once she feels better.  Recent lab work included a CMP, kidney and liver function tests, triglycerides, HDL, and LDL levels. Her LDL is at 117. Her TSH was recently checked.      PHYSICAL EXAM:  Blood  pressure 111/75, pulse 80, temperature 98.1 F (36.7 C), height 5' 3 (1.6 m), weight 191 lb (86.6 kg), last menstrual period 04/14/2016, SpO2 97%. Body mass index is 33.83 kg/m.  DIAGNOSTIC DATA REVIEWED:  BMET    Component Value Date/Time   NA 144 07/07/2020 0946   K 4.1 07/07/2020 0946   CL 104 07/07/2020 0946   CO2 25 07/07/2020 0946   GLUCOSE 108 (H) 07/07/2020 0946   GLUCOSE 110 (H) 01/07/2019 0930   BUN 17 07/07/2020 0946   CREATININE 0.93 07/07/2020 0946   CALCIUM 9.4 07/07/2020 0946   GFRNONAA 67 07/07/2020 0946   GFRAA 77 07/07/2020 0946   Lab Results  Component Value Date   HGBA1C 5.2 07/07/2020   HGBA1C 5.3 08/10/2016   Lab Results  Component Value Date   INSULIN  13.9 07/07/2020   INSULIN  38.9 (H) 08/10/2016   Lab Results  Component Value Date   TSH 1.340 07/07/2020   CBC    Component Value Date/Time   WBC 5.5 07/07/2020 0946   WBC 8.8 01/07/2019 0930   RBC 4.83 07/07/2020 0946   RBC 4.54 01/07/2019 0930   HGB 13.5 07/07/2020 0946   HCT 41.1 07/07/2020 0946   PLT 297 07/07/2020 0946   MCV 85 07/07/2020 0946   MCH 28.0 07/07/2020 0946   MCH 28.2 01/07/2019 0930   MCHC 32.8 07/07/2020 0946   MCHC 32.9 01/07/2019 0930   RDW 13.3 07/07/2020 0946   Iron Studies No results found for:  IRON, TIBC, FERRITIN, IRONPCTSAT Lipid Panel     Component Value Date/Time   CHOL 254 (H) 07/07/2020 0946   TRIG 115 07/07/2020 0946   HDL 60 07/07/2020 0946   LDLCALC 174 (H) 07/07/2020 0946   Hepatic Function Panel     Component Value Date/Time   PROT 6.8 07/07/2020 0946   ALBUMIN 4.5 07/07/2020 0946   AST 19 07/07/2020 0946   ALT 16 07/07/2020 0946   ALKPHOS 88 07/07/2020 0946   BILITOT 0.5 07/07/2020 0946      Component Value Date/Time   TSH 1.340 07/07/2020 0946   Nutritional Lab Results  Component Value Date   VD25OH 51.6 04/18/2023   VD25OH 44.0 07/07/2020   VD25OH 40.1 02/25/2020     Assessment and Plan Assessment &  Plan Occipital Neuralgia An MRI has been ordered to further investigate. She has been prescribed a Medrol  Dosepak and gabapentin . Gabapentin  may cause weight gain, but benefits may outweigh negatives if it provides symptom relief. She plans to start gabapentin  at bedtime to assess tolerance. - Await MRI results - Start Medrol  Dosepak as prescribed - Start gabapentin  at bedtime and monitor for symptom relief and side effects - Will delay exercise until we have more information  Obesity Weight has been maintained over the last month, following the category two eating plan about 50% of the time. Exercise has been limited due to time constraints while assisting her mother, who is recovering from a hip fracture. She is interested in resuming her walking exercise program once her situation stabilizes. A high-protein, low-calorie dessert recipe was provided to aid in dietary adherence. - Continue category two eating plan - Encourage resumption of walking exercise program when feasible - Try high-protein, low-calorie dessert recipe  Hyperlipidemia LDL cholesterol is at 117 mg/dL, below the threshold for medication but above the optimal level of under 100 mg/dL for her risk factors. The goal is to maintain LDL under 130 mg/dL to avoid medication, with a preference for under 100 mg/dL. - Continue monitoring lipid levels - Encourage dietary modifications to lower LDL cholesterol  General Health Maintenance Recent lab work, including a CMP, showed good kidney and liver function. Triglycerides and HDL levels are good. TSH levels are within normal range.     I have personally spent 33 minutes total time today in preparation, patient care, and documentation for this visit, including the following: review of clinical lab tests; review of medical history, and nutritional counseling   She was informed of the importance of frequent follow up visits to maximize her success with intensive lifestyle  modifications for her multiple health conditions.    Louann Penton, MD

## 2024-02-21 ENCOUNTER — Other Ambulatory Visit: Payer: Self-pay

## 2024-02-21 MED ORDER — ALPRAZOLAM 0.25 MG PO TABS
ORAL_TABLET | ORAL | 0 refills | Status: DC
  Filled 2024-02-21: qty 4, 2d supply, fill #0

## 2024-02-22 ENCOUNTER — Other Ambulatory Visit: Payer: Self-pay

## 2024-02-22 MED ORDER — LEVOTHYROXINE SODIUM 112 MCG PO TABS
112.0000 ug | ORAL_TABLET | Freq: Every day | ORAL | 1 refills | Status: DC
  Filled 2024-02-22: qty 90, 90d supply, fill #0
  Filled 2024-05-25: qty 90, 90d supply, fill #1
  Filled 2024-09-03: qty 90, 90d supply, fill #2

## 2024-02-27 ENCOUNTER — Ambulatory Visit

## 2024-02-27 DIAGNOSIS — M79602 Pain in left arm: Secondary | ICD-10-CM | POA: Diagnosis not present

## 2024-02-27 DIAGNOSIS — M79601 Pain in right arm: Secondary | ICD-10-CM | POA: Diagnosis not present

## 2024-02-27 DIAGNOSIS — R202 Paresthesia of skin: Secondary | ICD-10-CM

## 2024-02-27 DIAGNOSIS — R200 Anesthesia of skin: Secondary | ICD-10-CM | POA: Diagnosis not present

## 2024-02-27 DIAGNOSIS — M5412 Radiculopathy, cervical region: Secondary | ICD-10-CM

## 2024-02-27 DIAGNOSIS — M2480 Other specific joint derangements of unspecified joint, not elsewhere classified: Secondary | ICD-10-CM

## 2024-02-27 DIAGNOSIS — M5382 Other specified dorsopathies, cervical region: Secondary | ICD-10-CM

## 2024-02-27 DIAGNOSIS — R29898 Other symptoms and signs involving the musculoskeletal system: Secondary | ICD-10-CM

## 2024-02-27 DIAGNOSIS — M542 Cervicalgia: Secondary | ICD-10-CM | POA: Diagnosis not present

## 2024-02-27 DIAGNOSIS — H539 Unspecified visual disturbance: Secondary | ICD-10-CM

## 2024-02-27 DIAGNOSIS — R519 Headache, unspecified: Secondary | ICD-10-CM | POA: Diagnosis not present

## 2024-02-27 DIAGNOSIS — G8929 Other chronic pain: Secondary | ICD-10-CM

## 2024-02-27 MED ORDER — GADOBENATE DIMEGLUMINE 529 MG/ML IV SOLN
18.0000 mL | Freq: Once | INTRAVENOUS | Status: AC | PRN
  Administered 2024-02-27: 18 mL via INTRAVENOUS

## 2024-03-03 ENCOUNTER — Ambulatory Visit: Payer: Self-pay | Admitting: Neurology

## 2024-03-04 NOTE — Telephone Encounter (Signed)
 Spoke with patient and discussed MRI cervical spine results as noted below. The patient verbalized understanding and agreed to PT referral as well as referral for injections. She would like PT at [facility] in [place] where she lives. She thanked me for the call.

## 2024-03-04 NOTE — Telephone Encounter (Signed)
-----   Message from Onetha KATHEE Epp sent at 03/03/2024  5:54 PM EDT ----- (Will you call and discuss with patient and if she agrees, let me know and I can place orders thank you) Ms. Dykman, the mri of the cervical spine shows arthritc changes that are not signficant or  concerning but can cause neck pain/occipital neuralgia. I think we should order physical therapy and send you for evaluation of injections into the cervical spine to see if they can help with the  neck pain. I can ask my team to call you and see if you are willing and I can order it, thank you ----- Message ----- From: Rosemarie Eather RAMAN, MD Sent: 02/27/2024  11:07 AM EDT To: Onetha KATHEE Epp, MD

## 2024-03-05 ENCOUNTER — Other Ambulatory Visit: Payer: Self-pay | Admitting: Neurology

## 2024-03-05 DIAGNOSIS — M5412 Radiculopathy, cervical region: Secondary | ICD-10-CM

## 2024-03-05 DIAGNOSIS — M542 Cervicalgia: Secondary | ICD-10-CM

## 2024-03-05 DIAGNOSIS — M7918 Myalgia, other site: Secondary | ICD-10-CM

## 2024-03-05 DIAGNOSIS — M47892 Other spondylosis, cervical region: Secondary | ICD-10-CM

## 2024-03-06 ENCOUNTER — Telehealth: Payer: Self-pay | Admitting: Neurology

## 2024-03-06 NOTE — Telephone Encounter (Signed)
 Referral for orthopedic surgery fax to [facility]. Phone: [phone], Fax: [phone]

## 2024-03-11 ENCOUNTER — Other Ambulatory Visit: Payer: Self-pay

## 2024-03-19 ENCOUNTER — Encounter (INDEPENDENT_AMBULATORY_CARE_PROVIDER_SITE_OTHER): Payer: Self-pay | Admitting: Family Medicine

## 2024-03-19 ENCOUNTER — Ambulatory Visit: Attending: Neurology

## 2024-03-19 ENCOUNTER — Ambulatory Visit (INDEPENDENT_AMBULATORY_CARE_PROVIDER_SITE_OTHER): Admitting: Family Medicine

## 2024-03-19 VITALS — BP 117/75 | HR 74 | Temp 97.4°F | Ht 63.0 in | Wt 192.0 lb

## 2024-03-19 DIAGNOSIS — R632 Polyphagia: Secondary | ICD-10-CM

## 2024-03-19 DIAGNOSIS — M5412 Radiculopathy, cervical region: Secondary | ICD-10-CM | POA: Insufficient documentation

## 2024-03-19 DIAGNOSIS — G588 Other specified mononeuropathies: Secondary | ICD-10-CM | POA: Diagnosis not present

## 2024-03-19 DIAGNOSIS — G4486 Cervicogenic headache: Secondary | ICD-10-CM | POA: Insufficient documentation

## 2024-03-19 DIAGNOSIS — Z6834 Body mass index (BMI) 34.0-34.9, adult: Secondary | ICD-10-CM

## 2024-03-19 DIAGNOSIS — M5030 Other cervical disc degeneration, unspecified cervical region: Secondary | ICD-10-CM | POA: Diagnosis not present

## 2024-03-19 DIAGNOSIS — M479 Spondylosis, unspecified: Secondary | ICD-10-CM

## 2024-03-19 DIAGNOSIS — E669 Obesity, unspecified: Secondary | ICD-10-CM

## 2024-03-19 DIAGNOSIS — M5481 Occipital neuralgia: Secondary | ICD-10-CM | POA: Insufficient documentation

## 2024-03-19 DIAGNOSIS — M7918 Myalgia, other site: Secondary | ICD-10-CM | POA: Insufficient documentation

## 2024-03-19 DIAGNOSIS — M542 Cervicalgia: Secondary | ICD-10-CM | POA: Insufficient documentation

## 2024-03-19 NOTE — Progress Notes (Signed)
 Office: [phone]  /  Fax: [phone]  WEIGHT SUMMARY AND BIOMETRICS  Anthropometric Measurements Height: 5' 3 (1.6 m) Weight: 192 lb (87.1 kg) BMI (Calculated): 34.02 Weight at Last Visit: 191lb Weight Lost Since Last Visit: 0lb Weight Gained Since Last Visit: 1lb Starting Weight: 240lb Total Weight Loss (lbs): 48 lb (21.8 kg) Peak Weight: 255lb   Body Composition  Body Fat %: 42.2 % Fat Mass (lbs): 81.2 lbs Muscle Mass (lbs): 105.8 lbs Total Body Water (lbs): 73.6 lbs Visceral Fat Rating : 12   Other Clinical Data Fasting: No Labs: no Today's Visit #: 9 Starting Date: 08/10/16    Chief Complaint: OBESITY    History of Present Illness  She is adhering to the category two eating plan 60% of the time and has not been engaging in any exercise. She has gained one pound in the last month. She experiences increased 'food noise' and has been consuming foods she previously avoided, attributing this to boredom or emotional eating. She has been purchasing items at the grocery store that she had not been consuming for a while.  She is currently on Zepbound  but is unsure if it is effectively controlling her excessive hunger.  She has a history of neck issues, including bulging discs and degenerative changes, and has started therapy. She is scheduled to receive an injection for suboccipital neuralgia on the 20th of this month. She has previously benefited from physical therapy for neck arthritis. Her husband, a Land, used to assist with treatments, but she has not received any treatment from him recently.  She acknowledges not eating apples as she should, recognizing them as a good source of crunch in her diet. She recently bought a bag of apples to incorporate them back into her diet.      PHYSICAL EXAM:  Blood pressure 117/75, pulse 74, temperature (!) 97.4 F (36.3 C), height 5' 3 (1.6 m), weight 192 lb (87.1 kg), last menstrual period 04/14/2016, SpO2  99%. Body mass index is 34.01 kg/m.  DIAGNOSTIC DATA REVIEWED:  BMET    Component Value Date/Time   NA 144 07/07/2020 0946   K 4.1 07/07/2020 0946   CL 104 07/07/2020 0946   CO2 25 07/07/2020 0946   GLUCOSE 108 (H) 07/07/2020 0946   GLUCOSE 110 (H) 01/07/2019 0930   BUN 17 07/07/2020 0946   CREATININE 0.93 07/07/2020 0946   CALCIUM 9.4 07/07/2020 0946   GFRNONAA 67 07/07/2020 0946   GFRAA 77 07/07/2020 0946   Lab Results  Component Value Date   HGBA1C 5.2 07/07/2020   HGBA1C 5.3 08/10/2016   Lab Results  Component Value Date   INSULIN  13.9 07/07/2020   INSULIN  38.9 (H) 08/10/2016   Lab Results  Component Value Date   TSH 1.340 07/07/2020   CBC    Component Value Date/Time   WBC 5.5 07/07/2020 0946   WBC 8.8 01/07/2019 0930   RBC 4.83 07/07/2020 0946   RBC 4.54 01/07/2019 0930   HGB 13.5 07/07/2020 0946   HCT 41.1 07/07/2020 0946   PLT 297 07/07/2020 0946   MCV 85 07/07/2020 0946   MCH 28.0 07/07/2020 0946   MCH 28.2 01/07/2019 0930   MCHC 32.8 07/07/2020 0946   MCHC 32.9 01/07/2019 0930   RDW 13.3 07/07/2020 0946   Iron Studies No results found for: IRON, TIBC, FERRITIN, IRONPCTSAT Lipid Panel     Component Value Date/Time   CHOL 254 (H) 07/07/2020 0946   TRIG 115 07/07/2020 0946   HDL 60  07/07/2020 0946   LDLCALC 174 (H) 07/07/2020 0946   Hepatic Function Panel     Component Value Date/Time   PROT 6.8 07/07/2020 0946   ALBUMIN 4.5 07/07/2020 0946   AST 19 07/07/2020 0946   ALT 16 07/07/2020 0946   ALKPHOS 88 07/07/2020 0946   BILITOT 0.5 07/07/2020 0946      Component Value Date/Time   TSH 1.340 07/07/2020 0946   Nutritional Lab Results  Component Value Date   VD25OH 51.6 04/18/2023   VD25OH 44.0 07/07/2020   VD25OH 40.1 02/25/2020     Assessment and Plan Assessment & Plan Obesity and polyphagia Following the category two eating plan approximately 60% of the time, not currently exercising, and has gained one pound in the  last month. Reports increased food cravings, possibly due to boredom or emotional eating, and acknowledges consuming foods she previously avoided. Increased carbohydrate intake can counteract the effects of Zepbound  by causing a larger insulin  response, which can increase hunger. - Continue Zepbound  as prescribed. - Encourage adherence to the category two eating plan. - Discuss strategies to manage cravings, including trying protein-rich snacks like kale chips, Parmesan cheese crisps, and okra chips. - Encourage consumption of healthy snacks like apples. - Reinforce the importance of avoiding processed foods and high-carbohydrate snacks. - Schedule follow-up appointment on September 6th.  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 Started physical therapy to improve posture and strengthen muscles, which has been beneficial in the past. Scheduled for an injection for cervical neuralgia on August 20th at [facility]. - Continue physical therapy to improve posture and strengthen neck muscles. - Proceed with scheduled injection for cervical neuralgia on August 20th at [facility].   She was informed of the importance of frequent follow up visits to maximize her success with intensive lifestyle modifications for her multiple health conditions.   I have personally spent 31 minutes total time today in preparation, patient care, and documentation for this visit, including the following: review of clinical lab tests and nutritional counseling  Louann Penton, MD

## 2024-03-19 NOTE — Therapy (Signed)
 OUTPATIENT PHYSICAL THERAPY EVALUATION  Patient Name: Katherine Smith MRN: [id number] DOB:07-Dec-1960, 63 y.o., female Today's Date: 03/19/2024  END OF SESSION:  PT End of Session - 03/19/24 0805     Visit Number 1    Number of Visits 12    Date for PT Re-Evaluation 04/30/24    Authorization Type [employer]    Authorization Time Period 03/19/24-04/30/24    Progress Note Due on Visit 10    PT Start Time 0800    PT Stop Time 0840    PT Time Calculation (min) 40 min    Equipment Utilized During Treatment Gait belt    Activity Tolerance Patient tolerated treatment well          Past Medical History:  Diagnosis Date   Anxiety    Back pain    Cancer (HCC)    BASAL CELL SKIN CANCER   Depression    Fatty liver    GERD (gastroesophageal reflux disease)    Hashimoto's thyroiditis    HTN (hypertension)    Hyperlipidemia    Hypothyroidism    IBS (irritable bowel syndrome)    Insomnia    Joint pain    Lactose intolerance    Lichen sclerosus    Obesity    Psoriasis    Sleep apnea    Past Surgical History:  Procedure Laterality Date   ANTERIOR DISC ARTHROPLASTY     2004   CATARACT EXTRACTION W/ INTRAOCULAR LENS IMPLANT Bilateral    COLONOSCOPY  12/18/2011   COLONOSCOPY N/A 11/29/2022   Procedure: COLONOSCOPY;  Surgeon: Toledo, Ladell POUR, MD;  Location: [facility] ENDOSCOPY;  Service: Gastroenterology;  Laterality: N/A;  DM   COLONOSCOPY WITH PROPOFOL  N/A 04/04/2017   Procedure: COLONOSCOPY WITH PROPOFOL ;  Surgeon: Viktoria Lamar DASEN, MD;  Location: [facility] ENDOSCOPY;  Service: Endoscopy;  Laterality: N/A;   SHOULDER ARTHROSCOPY WITH ROTATOR CUFF REPAIR AND SUBACROMIAL DECOMPRESSION Left 07/12/2020   Procedure: SHOULDER ARTHROSCOPY WITH ARTHROSCOPIC  ROTATOR CUFF REPAIR AND SUBACROMIAL DECOMPRESSION;  Surgeon: Dozier Soulier, MD;  Location: [facility];  Service: Orthopedics;  Laterality: Left;   Patient Active Problem List   Diagnosis Date Noted   Chronic  intractable headache 11/12/2023   Stress 02/14/2023   Polyphagia 10/11/2022   Obesity, Beginning BMI 42.51 09/06/2022   Acquired hypothyroidism 08/29/2022   GERD (gastroesophageal reflux disease) 08/29/2022   Lichen sclerosus 08/29/2022   GAD (generalized anxiety disorder) 09/01/2020   Depression 01/08/2020   Primary insomnia 07/31/2018   Class 3 severe obesity with serious comorbidity and body mass index (BMI) of 40.0 to 44.9 in adult 02/07/2017   Elevated liver function tests 11/23/2016   B12 nutritional deficiency 11/23/2016   Essential hypertension 10/25/2016   Prediabetes 10/25/2016   BMI 33.0-33.9,adult 09/26/2016   Hashimoto's thyroiditis 09/07/2016   Sleep apnea 09/07/2016   Insulin  resistance 08/24/2016   Vitamin D  deficiency 08/24/2016   Hyperlipidemia 08/24/2016   Morbid obesity (HCC) 08/24/2016    PCP: Alla Amis, MD REFERRING PROVIDER: Onetha Epp, MD (neurology)  REFERRING DIAG: Occipital neuralgia   THERAPY DIAG:  Occipital neuralgia of right side  Cervicogenic headache  Rationale for Evaluation and Treatment: Rehabilitation  ONSET DATE: Chronic recurrent  SUBJECTIVE:  SUBJECTIVE STATEMENT: Pt wants help with shooting occipital nerve pain.   PERTINENT HISTORY:  63yoF referred to OPPT by neurology for chronic neck pain/occipital neuralgia on the right, with neck movement shooting pain up the back of the right head. Also reports history of  migraine, not very frequent. Pt has been started on Neurontin  by neurology which is helping some. Neurology recommending injections for this in coming weeks. Pt reports similar problem over the last 3-5 years, managed well by her husband who is a Land, would have periods of remittance, however pt reports he  lives out of state at present. 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 PMH: HA, Vit D deficiency, HLD, hashimoto's disease, OSA, HTN, B12 def, hypoTSH, GAD.  PAIN:  Are you having pain? 0 at present, 4/10 shooting occipital nerve pain on Right with certain head movements. PRECAUTIONS: None WEIGHT BEARING RESTRICTIONS: No FALLS:  Has patient fallen in last 6 months? 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  DIAGNOSTIC FINDINGS:  MRI: per neurology, no concerning finding related to occipital neuralgia, general age related cervical spondylosis.   PATIENT SURVEYS:  NDI: 30%  POSTURE: rigid mid to upper thoracic kyphosis with slight forward head posturing, pec tightness with Left > Rt scapular protraction at rest.   CERVICAL SPINE MOBILITY:   Active ROM A/PROM (deg) eval  Extension (degrees to LOG) 40*  Right lateral flexion 14*  Left lateral flexion 14*  Right rotation 55*  Left rotation 60*   (* = end range pain)  TREATMENT DATE 03/19/24 :                                                                                                                                 -supine on thoracic towel roll x 5 minutes  -concurrent bilat pec T stretch 3x75sec -cervical retraction to neutral (starting on 2 pillows) 15x3secH  -standing BUE BTB row 15x3secH  -standing BUE RTB ER 15x3secH  *education on symptoms monitoring for HEP performance  PATIENT EDUCATION:  Education details: regional interdependence: thoracic kyphosis and forward head posturing and closing of neurological spaces in upper cervical region. Also discussed how global cervical stiffness can create overload to upper cervical region.  Person educated: Katherine Smith Education method:  collaborative learning, deliberate practice, positive reinforcement, explicit instruction, establish rules. Education comprehension: generally good   HOME EXERCISE PROGRAM: Access Code: [email or web address] URL: [email or web address] Date: 03/19/2024 Prepared by: Peggye Linear  Exercises - Standing Row with Anchored Resistance  - 3 x daily - 1 sets - 15 reps - 3sec hold - Standing Shoulder External Rotation with Resistance  - 3 x daily - 1 sets - 15 reps - 3sec hold - Supine Cervical Retraction with Towel  - 3 x  daily - 1 sets - 15 reps - 3sec hold - Supine Thoracic Mobilization Towel Roll Vertical with Arm Stretch  - 2 x daily - 7 x weekly - 1 sets - 3 reps - 60sec hold  ASSESSMENT:  CLINICAL IMPRESSION: 63yoF referred to OPPT for occipital neuralgia that is affecting her ability to sleep, drive, and perform work duties without pain limitations. Exam notable for joint hypomobility in thoracic spine, cervical spine and scapulothoracic joints, all of which place excessive burden on upper cervical spine segments for ADL/IADL head movements. Initiated treatment today, generally good tolerance, and issued HEP for thoracic mobility and postural strengthening. Pt denies any symptoms aggravation during treatment session. Patient will benefit from skilled physical therapy intervention to reduce deficits and impairments identified in evaluation, in order to reduce pain, improve quality of life, and maximize activity tolerance for ADL, IADL, and leisure/fitness. Physical therapy will help pt achieve long and short term goals of care.    OBJECTIVE IMPAIRMENTS: Decreased knowledge of condition, decreased use of DME, decreased mobility, difficulty walking, decreased strength, decreased ROM. ACTIVITY LIMITATIONS: Lifting, standing, walking, squatting, transfers, locomotion level PARTICIPATION LIMITATIONS: Cleaning, laundry, interpersonal relationships, driving, yardwork, community activity.  PERSONAL  FACTORS: Age, behavior pattern, education, past/current experiences, transportation, profession are also affecting patient's functional outcome.  REHAB POTENTIAL: Good  CLINICAL DECISION MAKING: Moderate  EVALUATION COMPLEXITY: Medium    GOALS: Goals reviewed with patient? Yes  SHORT TERM GOALS: Target date: 04/19/24  Pt to demonstrate improved cervical extension 50 degrees (standing to LOG) and rotation> 60 degrees bilat all without shooting nerve pain.  Baseline:  Goal status: INITIAL  2.  Pt to have a decrease in self reported disability pertaining to neck pain AEB reduced NDI score >10%.  Baseline:  Goal status: INITIAL  LONG TERM GOALS: Target date: 05/19/24  Pt to have a decrease in self reported disability pertaining to neck pain AEB reduced NDI score >20%. Baseline: 30%  Goal status: INITIAL  2.  Pt to demonstrate cervical rotation ROM >70 degrees bilat without shooting nerve pain to improve safety and performance in driving a motor vehicle.  Baseline:  Goal status: INITIAL  3.  Pt to have reduced muscle tightness and adaptive muscle shortening AEB cervical sidebedning >24 degrees bilat without pain.  Baseline:  Goal status: INITIAL  4.  Pt to report confidence and consistency with long term HEP for DC to promote continued thoracic extension mobility and scapulothoracic strengthening to reduce recurrent of symptoms and to promote age-related bone density health.  Baseline:  Goal status: INITIAL PLAN:  PT FREQUENCY: 1-2x/week  PT DURATION: 6 weeks PLANNED INTERVENTIONS: 97110-Therapeutic exercises, 97530- Therapeutic activity, [id number]- Neuromuscular re-education, 97535- Self Care, [id number]- Manual therapy, [id number]- Electrical stimulation (unattended), [phone]- Electrical stimulation (manual), 20560 (1-2 muscles), 20561 (3+ muscles)- Dry Needling, Patient/Family education, Joint mobilization, Spinal mobilization, and Moist heat  PLAN FOR NEXT SESSION: Review HEP for technique and  symptoms reponse; teach self mobilization for C0/1 and C1/2 as tolerated, continue to advance scapulothoracic strengthening and ROM.   Melburn Treiber C, PT 03/19/2024, 8:13 AM  9:39 AM, 03/19/24 Peggye JAYSON Linear, PT, DPT Physical Therapist - [facility]  Outpatient Physical Therapy- Main Campus [phone]

## 2024-03-21 ENCOUNTER — Ambulatory Visit

## 2024-03-21 DIAGNOSIS — M5481 Occipital neuralgia: Secondary | ICD-10-CM | POA: Diagnosis not present

## 2024-03-21 DIAGNOSIS — G4486 Cervicogenic headache: Secondary | ICD-10-CM | POA: Diagnosis not present

## 2024-03-21 DIAGNOSIS — M5412 Radiculopathy, cervical region: Secondary | ICD-10-CM | POA: Diagnosis not present

## 2024-03-21 DIAGNOSIS — M7918 Myalgia, other site: Secondary | ICD-10-CM | POA: Diagnosis not present

## 2024-03-21 DIAGNOSIS — M542 Cervicalgia: Secondary | ICD-10-CM | POA: Diagnosis not present

## 2024-03-21 NOTE — Therapy (Signed)
 OUTPATIENT PHYSICAL THERAPY TREATMENT  Patient Name: Katherine Smith MRN: [id number] DOB:Mar 08, 1961, 63 y.o., female Today's Date: 03/21/2024  END OF SESSION:  PT End of Session - 03/21/24 0854     Visit Number 2    Number of Visits 12    Date for PT Re-Evaluation 04/30/24    Authorization Type [employer]    Authorization Time Period 03/19/24-04/30/24    Progress Note Due on Visit 10    PT Start Time 0846    PT Stop Time 0929    PT Time Calculation (min) 43 min    Equipment Utilized During Treatment Gait belt    Activity Tolerance Patient tolerated treatment well           Past Medical History:  Diagnosis Date   Anxiety    Back pain    Cancer (HCC)    BASAL CELL SKIN CANCER   Depression    Fatty liver    GERD (gastroesophageal reflux disease)    Hashimoto's thyroiditis    HTN (hypertension)    Hyperlipidemia    Hypothyroidism    IBS (irritable bowel syndrome)    Insomnia    Joint pain    Lactose intolerance    Lichen sclerosus    Obesity    Psoriasis    Sleep apnea    Past Surgical History:  Procedure Laterality Date   ANTERIOR DISC ARTHROPLASTY     2004   CATARACT EXTRACTION W/ INTRAOCULAR LENS IMPLANT Bilateral    COLONOSCOPY  12/18/2011   COLONOSCOPY N/A 11/29/2022   Procedure: COLONOSCOPY;  Surgeon: Toledo, Ladell POUR, MD;  Location: [facility] ENDOSCOPY;  Service: Gastroenterology;  Laterality: N/A;  DM   COLONOSCOPY WITH PROPOFOL  N/A 04/04/2017   Procedure: COLONOSCOPY WITH PROPOFOL ;  Surgeon: Viktoria Lamar DASEN, MD;  Location: [facility] ENDOSCOPY;  Service: Endoscopy;  Laterality: N/A;   SHOULDER ARTHROSCOPY WITH ROTATOR CUFF REPAIR AND SUBACROMIAL DECOMPRESSION Left 07/12/2020   Procedure: SHOULDER ARTHROSCOPY WITH ARTHROSCOPIC  ROTATOR CUFF REPAIR AND SUBACROMIAL DECOMPRESSION;  Surgeon: Dozier Soulier, MD;  Location: [facility];  Service: Orthopedics;  Laterality: Left;   Patient Active Problem List   Diagnosis Date Noted   Chronic  intractable headache 11/12/2023   Stress 02/14/2023   Polyphagia 10/11/2022   Obesity, Beginning BMI 42.51 09/06/2022   Acquired hypothyroidism 08/29/2022   GERD (gastroesophageal reflux disease) 08/29/2022   Lichen sclerosus 08/29/2022   GAD (generalized anxiety disorder) 09/01/2020   Depression 01/08/2020   Primary insomnia 07/31/2018   Class 3 severe obesity with serious comorbidity and body mass index (BMI) of 40.0 to 44.9 in adult 02/07/2017   Elevated liver function tests 11/23/2016   B12 nutritional deficiency 11/23/2016   Essential hypertension 10/25/2016   Prediabetes 10/25/2016   BMI 33.0-33.9,adult 09/26/2016   Hashimoto's thyroiditis 09/07/2016   Sleep apnea 09/07/2016   Insulin  resistance 08/24/2016   Vitamin D  deficiency 08/24/2016   Hyperlipidemia 08/24/2016   Morbid obesity (HCC) 08/24/2016    PCP: Alla Amis, MD REFERRING PROVIDER: Onetha Epp, MD (neurology)  REFERRING DIAG: Occipital neuralgia   THERAPY DIAG:  Occipital neuralgia of right side  Cervicogenic headache  Rationale for Evaluation and Treatment: Rehabilitation  ONSET DATE: Chronic recurrent  SUBJECTIVE:  SUBJECTIVE STATEMENT: Patient reports she has been a little sore but compliant with her HEP. I am doing okay. Glad I chose to go forward with PT as I do think it will help.   PERTINENT HISTORY:  63yoF referred to OPPT by neurology for chronic neck pain/occipital neuralgia on the right, with neck movement shooting pain up the back of the right head. Also reports history of  migraine, not very frequent. Pt has been started on Neurontin  by neurology which is helping some. Neurology recommending injections for this in coming weeks. Pt reports similar problem over the last 3-5 years, managed  well by her husband who is a Land, would have periods of remittance, however pt reports he lives out of state at present. 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 PMH: HA, Vit D deficiency, HLD, hashimoto's disease, OSA, HTN, B12 def, hypoTSH, GAD.  PAIN:  Are you having pain? 0 at present, 4/10 shooting occipital nerve pain on Right with certain head movements. PRECAUTIONS: None WEIGHT BEARING RESTRICTIONS: No FALLS:  Has patient fallen in last 6 months? 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  DIAGNOSTIC FINDINGS:  MRI: per neurology, no concerning finding related to occipital neuralgia, general age related cervical spondylosis.   PATIENT SURVEYS:  NDI: 30%  POSTURE: rigid mid to upper thoracic kyphosis with slight forward head posturing, pec tightness with Left > Rt scapular protraction at rest.   CERVICAL SPINE MOBILITY:   Active ROM A/PROM (deg) eval  Extension (degrees to LOG) 40*  Right lateral flexion 14*  Left lateral flexion 14*  Right rotation 55*  Left rotation 60*   (* = end range pain)  TREATMENT DATE 03/21/24 :                                                                                                                                  Manual therapy:  - suboccipital release (hold 60 sec x 5)  - Manual cervical traction hold 30 sec x 5 -Manual cervical side bend to left with traction - hold 30 sec x 2 - End range cervical rotation x 5 ea direction and then SB x 5 ea Direction -Side glide to C4-7 on R with 20 bouts grade 3   Neuromuscular Re-Education: neuromuscular reeducation of movement, balance, coordination, kinesthetic sense, posture and proprioception for sitting and/or standing  -supine on pool  noodle with pectoral stretch x 2 minutes  -concurrent bilat pec T stretch 2 x 60 sec -cervical retraction to  15x3secH (supine)  - Supine scap retract (active with pool noodle along T-spine) x 15 - VC for technique -Self mob  with cervical rotation and head nod (minimal) x 20 -Self mob with chin tuck/scap retract then  rotation (minimal) x 15 -Self stretch with forward cervical flex with overpressure then rotation x 30 sec hold x 3 ea direction -Wall posture stretch at wall x 60 sec hold x 2  PATIENT EDUCATION:  Education details: Review of HEP and provided handout today. Person educated: Jeydi Education method: collaborative learning, deliberate practice, positive reinforcement, explicit instruction, establish rules. Education comprehension: generally good   HOME EXERCISE PROGRAM: Access Code: [email or web address] URL: [email or web address] Date: 03/19/2024 Prepared by: Peggye Linear  Exercises - Standing Row with Anchored Resistance  - 3 x daily - 1 sets - 15 reps - 3sec hold - Standing Shoulder External Rotation with Resistance  - 3 x daily - 1 sets - 15 reps - 3sec hold - Supine Cervical Retraction with Towel  - 3 x daily - 1 sets - 15 reps - 3sec hold - Supine Thoracic Mobilization Towel Roll Vertical with Arm Stretch  - 2 x daily - 7 x weekly - 1 sets - 3 reps - 60sec hold  ASSESSMENT:  CLINICAL IMPRESSION: Patient presented with good motivation for 2nd visit today. She was responsive to all instructed postural activities - quick to ask questions and able to return good demo of correct technique. Provided handout from exercises instructed last session and all reviewed today. She was mostly pain free during session stating good relief with manual techniques. Good overall rehab potential and patient should continue to be appropriate for 1x/week so long as she continues with good compliance with activities for home use. Patient will benefit from skilled physical therapy  intervention to reduce deficits and impairments identified in evaluation, in order to reduce pain, improve quality of life, and maximize activity tolerance for ADL, IADL, and leisure/fitness. Physical therapy will help pt achieve long and short term goals of care.    OBJECTIVE IMPAIRMENTS: Decreased knowledge of condition, decreased use of DME, decreased mobility, difficulty walking, decreased strength, decreased ROM. ACTIVITY LIMITATIONS: Lifting, standing, walking, squatting, transfers, locomotion level PARTICIPATION LIMITATIONS: Cleaning, laundry, interpersonal relationships, driving, yardwork, community activity.  PERSONAL FACTORS: Age, behavior pattern, education, past/current experiences, transportation, profession are also affecting patient's functional outcome.  REHAB POTENTIAL: Good  CLINICAL DECISION MAKING: Moderate  EVALUATION COMPLEXITY: Medium    GOALS: Goals reviewed with patient? Yes  SHORT TERM GOALS: Target date: 04/19/24  Pt to demonstrate improved cervical extension 50 degrees (standing to LOG) and rotation> 60 degrees bilat all without shooting nerve pain.  Baseline:  Goal status: INITIAL  2.  Pt to have a decrease in self reported disability pertaining to neck pain AEB reduced NDI score >10%.  Baseline:  Goal status: INITIAL  LONG TERM GOALS: Target date: 05/19/24  Pt to have a decrease in self reported disability pertaining to neck pain AEB reduced NDI score >20%. Baseline: 30%  Goal status: INITIAL  2.  Pt to demonstrate cervical rotation ROM >70 degrees bilat without shooting nerve pain to improve safety and performance in driving a motor vehicle.  Baseline:  Goal status: INITIAL  3.  Pt to have reduced muscle tightness and adaptive muscle shortening AEB cervical sidebedning >24 degrees bilat without pain.  Baseline:  Goal status: INITIAL  4.  Pt to report confidence and consistency with long term HEP for DC to promote continued thoracic extension mobility  and scapulothoracic strengthening to reduce recurrent of symptoms and to promote age-related bone density health.  Baseline:  Goal status: INITIAL PLAN:  PT FREQUENCY: 1-2x/week  PT DURATION: 6 weeks PLANNED INTERVENTIONS: 97110-Therapeutic exercises, 97530- Therapeutic activity, [id number]- Neuromuscular  re-education, [phone]- Self Care, 02859- Manual therapy, G0283- Electrical stimulation (unattended), [phone]- Electrical stimulation (manual), [phone] (1-2 muscles), 20561 (3+ muscles)- Dry Needling, Patient/Family education, Joint mobilization, Spinal mobilization, and Moist heat  PLAN FOR NEXT SESSION:  Continue to review and progress HEP for technique and symptoms response Continue to advance scapulothoracic strengthening and ROM.   Reyes LOISE London, PT 03/21/2024, 10:59 AM  10:59 AM, 03/21/24 Physical Therapist - [facility]  Outpatient Physical Therapy- Main Campus [phone]

## 2024-03-26 ENCOUNTER — Other Ambulatory Visit: Payer: Self-pay

## 2024-03-26 ENCOUNTER — Ambulatory Visit

## 2024-03-26 DIAGNOSIS — M7918 Myalgia, other site: Secondary | ICD-10-CM | POA: Diagnosis not present

## 2024-03-26 DIAGNOSIS — M5412 Radiculopathy, cervical region: Secondary | ICD-10-CM | POA: Diagnosis not present

## 2024-03-26 DIAGNOSIS — G4486 Cervicogenic headache: Secondary | ICD-10-CM

## 2024-03-26 DIAGNOSIS — M5481 Occipital neuralgia: Secondary | ICD-10-CM

## 2024-03-26 DIAGNOSIS — M542 Cervicalgia: Secondary | ICD-10-CM | POA: Diagnosis not present

## 2024-03-26 NOTE — Therapy (Signed)
 OUTPATIENT PHYSICAL THERAPY TREATMENT  Patient Name: Katherine Smith MRN: [id number] DOB:Dec 18, 1960, 63 y.o., female Today's Date: 03/26/2024  END OF SESSION:  PT End of Session - 03/26/24 0807     Visit Number 3    Number of Visits 12    Date for PT Re-Evaluation 04/30/24    Authorization Type [employer]    Authorization Time Period 03/19/24-04/30/24    Progress Note Due on Visit 10    PT Start Time 0804    PT Stop Time 0845    PT Time Calculation (min) 41 min    Equipment Utilized During Treatment Gait belt    Activity Tolerance Patient tolerated treatment well            Past Medical History:  Diagnosis Date   Anxiety    Back pain    Cancer (HCC)    BASAL CELL SKIN CANCER   Depression    Fatty liver    GERD (gastroesophageal reflux disease)    Hashimoto's thyroiditis    HTN (hypertension)    Hyperlipidemia    Hypothyroidism    IBS (irritable bowel syndrome)    Insomnia    Joint pain    Lactose intolerance    Lichen sclerosus    Obesity    Psoriasis    Sleep apnea    Past Surgical History:  Procedure Laterality Date   ANTERIOR DISC ARTHROPLASTY     2004   CATARACT EXTRACTION W/ INTRAOCULAR LENS IMPLANT Bilateral    COLONOSCOPY  12/18/2011   COLONOSCOPY N/A 11/29/2022   Procedure: COLONOSCOPY;  Surgeon: Toledo, Ladell POUR, MD;  Location: [facility] ENDOSCOPY;  Service: Gastroenterology;  Laterality: N/A;  DM   COLONOSCOPY WITH PROPOFOL  N/A 04/04/2017   Procedure: COLONOSCOPY WITH PROPOFOL ;  Surgeon: Viktoria Lamar DASEN, MD;  Location: [facility] ENDOSCOPY;  Service: Endoscopy;  Laterality: N/A;   SHOULDER ARTHROSCOPY WITH ROTATOR CUFF REPAIR AND SUBACROMIAL DECOMPRESSION Left 07/12/2020   Procedure: SHOULDER ARTHROSCOPY WITH ARTHROSCOPIC  ROTATOR CUFF REPAIR AND SUBACROMIAL DECOMPRESSION;  Surgeon: Dozier Soulier, MD;  Location: [facility];  Service: Orthopedics;  Laterality: Left;   Patient Active Problem List   Diagnosis Date Noted   Chronic  intractable headache 11/12/2023   Stress 02/14/2023   Polyphagia 10/11/2022   Obesity, Beginning BMI 42.51 09/06/2022   Acquired hypothyroidism 08/29/2022   GERD (gastroesophageal reflux disease) 08/29/2022   Lichen sclerosus 08/29/2022   GAD (generalized anxiety disorder) 09/01/2020   Depression 01/08/2020   Primary insomnia 07/31/2018   Class 3 severe obesity with serious comorbidity and body mass index (BMI) of 40.0 to 44.9 in adult 02/07/2017   Elevated liver function tests 11/23/2016   B12 nutritional deficiency 11/23/2016   Essential hypertension 10/25/2016   Prediabetes 10/25/2016   BMI 33.0-33.9,adult 09/26/2016   Hashimoto's thyroiditis 09/07/2016   Sleep apnea 09/07/2016   Insulin  resistance 08/24/2016   Vitamin D  deficiency 08/24/2016   Hyperlipidemia 08/24/2016   Morbid obesity (HCC) 08/24/2016    PCP: Alla Amis, MD REFERRING PROVIDER: Onetha Epp, MD (neurology)  REFERRING DIAG: Occipital neuralgia   THERAPY DIAG:  Occipital neuralgia of right side  Cervicogenic headache  Rationale for Evaluation and Treatment: Rehabilitation  ONSET DATE: Chronic recurrent  SUBJECTIVE:  SUBJECTIVE STATEMENT:  Pt reports 1/10 pain upon arrival  Pt notes that she has made some progress with therapy, but it's still bothering her at night when she goes to sleep.      PERTINENT HISTORY:  63yoF referred to OPPT by neurology for chronic neck pain/occipital neuralgia on the right, with neck movement shooting pain up the back of the right head. Also reports history of  migraine, not very frequent. Pt has been started on Neurontin  by neurology which is helping some. Neurology recommending injections for this in coming weeks. Pt reports similar problem over the last 3-5 years,  managed well by her husband who is a Land, would have periods of remittance, however pt reports he lives out of state at present. 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 PMH: HA, Vit D deficiency, HLD, hashimoto's disease, OSA, HTN, B12 def, hypoTSH, GAD.  PAIN:  Are you having pain? 0 at present, 4/10 shooting occipital nerve pain on Right with certain head movements. PRECAUTIONS: None WEIGHT BEARING RESTRICTIONS: No FALLS:  Has patient fallen in last 6 months? 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  DIAGNOSTIC FINDINGS:  MRI: per neurology, no concerning finding related to occipital neuralgia, general age related cervical spondylosis.   PATIENT SURVEYS:  NDI: 30%  POSTURE: rigid mid to upper thoracic kyphosis with slight forward head posturing, pec tightness with Left > Rt scapular protraction at rest.   CERVICAL SPINE MOBILITY:   Active ROM A/PROM (deg) eval  Extension (degrees to LOG) 40*  Right lateral flexion 14*  Left lateral flexion 14*  Right rotation 55*  Left rotation 60*   (* = end range pain)  TREATMENT DATE 03/26/24 :                                                                                                                                 Manual therapy:   Supine STM to cervical region to increase extensibility of the paraspinals Supine suboccipital release technique to decrease cervicalgia Supine manual traction performed in order to increase joint space in cervical region for pain relief Supine cervical upglides/downglides, 30 sec bouts Supine UT/Levator stretch, 30 sec bouts to increase tissue extensibility of the cervical region   TherEx:  Seated UT stretch with verbal and visual cues, 30 sec  holds, x2 each Seated levator stretch with verbal and visual cues, 30 sec holds x2 each Standing shoulder W's, 2x10 Standing wall angels, 2x10 Standing forward wall slides with serratus lift off, 2x10  Pt educated throughout session about proper posture and technique with exercises. Improved exercise technique, movement at target joints, use of target muscles after min to mod verbal, visual, tactile cues   PATIENT EDUCATION:  Education details: Review  of HEP and provided handout today. Person educated: Reannah Education method: collaborative learning, deliberate practice, positive reinforcement, explicit instruction, establish rules. Education comprehension: generally good   HOME EXERCISE PROGRAM: Access Code: [email or web address] URL: [email or web address] Date: 03/19/2024 Prepared by: Peggye Linear & Sidra Simpers  Exercises - Standing Row with Anchored Resistance  - 3 x daily - 1 sets - 15 reps - 3sec hold - Standing Shoulder External Rotation with Resistance  - 3 x daily - 1 sets - 15 reps - 3sec hold - Supine Cervical Retraction with Towel  - 3 x daily - 1 sets - 15 reps - 3sec hold - Supine Thoracic Mobilization Towel Roll Vertical with Arm Stretch  - 2 x daily - 7 x weekly - 1 sets - 3 reps - 60sec hold - Wall Angels  - 1 x daily - 7 x weekly - 3 sets - 10 reps - Serratus Activation at Wall  - 1 x daily - 7 x weekly - 3 sets - 10 reps  ASSESSMENT:  CLINICAL IMPRESSION:  Pt responded well to the exercises and put forth good effort throughout the session.  Pt noted to have some pain relief with the manual therapy techniques during the session.  Pt educated on potential dry needling techniques and will discuss that further at the next session depending on the injection that she is to receive on the same day unless she puts it off.  Pt otherwise will continue to improve with HEP and exercises today are to be added.   Pt will continue to benefit from skilled therapy to address  remaining deficits in order to improve overall QoL and return to PLOF.    Patient presented with good motivation for 2nd visit today. She was responsive to all instructed postural activities - quick to ask questions and able to return good demo of correct technique. Provided handout from exercises instructed last session and all reviewed today. She was mostly pain free during session stating good relief with manual techniques. Good overall rehab potential and patient should continue to be appropriate for 1x/week so long as she continues with good compliance with activities for home use. Patient will benefit from skilled physical therapy intervention to reduce deficits and impairments identified in evaluation, in order to reduce pain, improve quality of life, and maximize activity tolerance for ADL, IADL, and leisure/fitness. Physical therapy will help pt achieve long and short term goals of care.    OBJECTIVE IMPAIRMENTS: Decreased knowledge of condition, decreased use of DME, decreased mobility, difficulty walking, decreased strength, decreased ROM. ACTIVITY LIMITATIONS: Lifting, standing, walking, squatting, transfers, locomotion level PARTICIPATION LIMITATIONS: Cleaning, laundry, interpersonal relationships, driving, yardwork, community activity.  PERSONAL FACTORS: Age, behavior pattern, education, past/current experiences, transportation, profession are also affecting patient's functional outcome.  REHAB POTENTIAL: Good  CLINICAL DECISION MAKING: Moderate  EVALUATION COMPLEXITY: Medium    GOALS: Goals reviewed with patient? Yes  SHORT TERM GOALS: Target date: 04/19/24  Pt to demonstrate improved cervical extension 50 degrees (standing to LOG) and rotation> 60 degrees bilat all without shooting nerve pain.  Baseline:  Goal status: INITIAL  2.  Pt to have a decrease in self reported disability pertaining to neck pain AEB reduced NDI score >10%.  Baseline:  Goal status: INITIAL  LONG TERM  GOALS: Target date: 05/19/24  Pt to have a decrease in self reported disability pertaining to neck pain AEB reduced NDI score >20%. Baseline: 30%  Goal status: INITIAL  2.  Pt to demonstrate cervical rotation ROM >70  degrees bilat without shooting nerve pain to improve safety and performance in driving a motor vehicle.  Baseline:  Goal status: INITIAL  3.  Pt to have reduced muscle tightness and adaptive muscle shortening AEB cervical sidebedning >24 degrees bilat without pain.  Baseline:  Goal status: INITIAL  4.  Pt to report confidence and consistency with long term HEP for DC to promote continued thoracic extension mobility and scapulothoracic strengthening to reduce recurrent of symptoms and to promote age-related bone density health.  Baseline:  Goal status: INITIAL PLAN:  PT FREQUENCY: 1-2x/week  PT DURATION: 6 weeks PLANNED INTERVENTIONS: 97110-Therapeutic exercises, 97530- Therapeutic activity, [id number]- Neuromuscular re-education, 97535- Self Care, 02859- Manual therapy, G0283- Electrical stimulation (unattended), [phone]- Electrical stimulation (manual), 20560 (1-2 muscles), 20561 (3+ muscles)- Dry Needling, Patient/Family education, Joint mobilization, Spinal mobilization, and Moist heat  PLAN FOR NEXT SESSION:  Continue to review and progress HEP for technique and symptoms response Continue to advance scapulothoracic strengthening and ROM.  Assess for trigger point dry needling.  Fonda Simpers, PT, DPT Physical Therapist - [facility]  03/26/24, 8:49 AM

## 2024-03-27 ENCOUNTER — Other Ambulatory Visit: Payer: Self-pay

## 2024-03-31 ENCOUNTER — Other Ambulatory Visit: Payer: Self-pay

## 2024-04-02 ENCOUNTER — Other Ambulatory Visit: Payer: Self-pay

## 2024-04-02 ENCOUNTER — Ambulatory Visit

## 2024-04-02 DIAGNOSIS — M542 Cervicalgia: Secondary | ICD-10-CM | POA: Diagnosis not present

## 2024-04-02 DIAGNOSIS — M5481 Occipital neuralgia: Secondary | ICD-10-CM | POA: Diagnosis not present

## 2024-04-02 DIAGNOSIS — G4486 Cervicogenic headache: Secondary | ICD-10-CM | POA: Diagnosis not present

## 2024-04-02 DIAGNOSIS — M7918 Myalgia, other site: Secondary | ICD-10-CM | POA: Diagnosis not present

## 2024-04-02 DIAGNOSIS — M5412 Radiculopathy, cervical region: Secondary | ICD-10-CM | POA: Diagnosis not present

## 2024-04-02 MED ORDER — TIZANIDINE HCL 2 MG PO TABS
2.0000 mg | ORAL_TABLET | Freq: Four times a day (QID) | ORAL | 2 refills | Status: AC | PRN
  Filled 2024-04-02: qty 30, 8d supply, fill #0
  Filled 2024-05-25: qty 30, 8d supply, fill #1
  Filled 2024-09-18: qty 30, 8d supply, fill #2

## 2024-04-02 NOTE — Therapy (Signed)
 OUTPATIENT PHYSICAL THERAPY TREATMENT  Patient Name: Katherine Smith MRN: [id number] DOB:Mar 13, 1961, 63 y.o., female Today's Date: 04/02/2024  END OF SESSION:  PT End of Session - 04/02/24 0805     Visit Number 4    Number of Visits 12    Date for PT Re-Evaluation 04/30/24    Authorization Type [employer]    Authorization Time Period 03/19/24-04/30/24    Progress Note Due on Visit 10    PT Start Time 0805    PT Stop Time 0845    PT Time Calculation (min) 40 min    Equipment Utilized During Treatment Gait belt    Activity Tolerance Patient tolerated treatment well            Past Medical History:  Diagnosis Date   Anxiety    Back pain    Cancer (HCC)    BASAL CELL SKIN CANCER   Depression    Fatty liver    GERD (gastroesophageal reflux disease)    Hashimoto's thyroiditis    HTN (hypertension)    Hyperlipidemia    Hypothyroidism    IBS (irritable bowel syndrome)    Insomnia    Joint pain    Lactose intolerance    Lichen sclerosus    Obesity    Psoriasis    Sleep apnea    Past Surgical History:  Procedure Laterality Date   ANTERIOR DISC ARTHROPLASTY     2004   CATARACT EXTRACTION W/ INTRAOCULAR LENS IMPLANT Bilateral    COLONOSCOPY  12/18/2011   COLONOSCOPY N/A 11/29/2022   Procedure: COLONOSCOPY;  Surgeon: Toledo, Ladell POUR, MD;  Location: [facility] END[facility]COPY;  Service: Gastroenterology;  Laterality: N/A;  DM   COLONOSCOPY WITH PROPOFOL  N/A 04/04/2017   Procedure: COLONOSCOPY WITH PROPOFOL ;  Surgeon: Viktoria Lamar DASEN, MD;  Location: [facility];  Service: Endoscopy;  Laterality: N/A;   SHOULDER ARTHROSCOPY WITH ROTATOR CUFF REPAIR AND SUBACROMIAL DECOMPRESSION Left 07/12/2020   Procedure: SHOULDER ARTHROSCOPY WITH ARTHROSCOPIC  ROTATOR CUFF REPAIR AND SUBACROMIAL DECOMPRESSION;  Surgeon: Dozier Soulier, MD;  Location: [facility];  Service: Orthopedics;  Laterality: Left;   Patient Active Problem List   Diagnosis Date Noted   Chronic  intractable headache 11/12/2023   Stress 02/14/2023   Polyphagia 10/11/2022   Obesity, Beginning BMI 42.51 09/06/2022   Acquired hypothyroidism 08/29/2022   GERD (gastroesophageal reflux disease) 08/29/2022   Lichen sclerosus 08/29/2022   GAD (generalized anxiety disorder) 09/01/2020   Depression 01/08/2020   Primary insomnia 07/31/2018   Class 3 severe obesity with serious comorbidity and body mass index (BMI) of 40.0 to 44.9 in adult 02/07/2017   Elevated liver function tests 11/23/2016   B12 nutritional deficiency 11/23/2016   Essential hypertension 10/25/2016   Prediabetes 10/25/2016   BMI 33.0-33.9,adult 09/26/2016   Hashimoto's thyroiditis 09/07/2016   Sleep apnea 09/07/2016   Insulin  resistance 08/24/2016   Vitamin D  deficiency 08/24/2016   Hyperlipidemia 08/24/2016   Morbid obesity (HCC) 08/24/2016    PCP: Alla Amis, MD REFERRING PROVIDER: Onetha Epp, MD (neurology)  REFERRING DIAG: Occipital neuralgia   THERAPY DIAG:  Occipital neuralgia of right side  Cervicogenic headache  Rationale for Evaluation and Treatment: Rehabilitation  ONSET DATE: Chronic recurrent  SUBJECTIVE:  SUBJECTIVE STATEMENT:  Pt reports 3/10 pain today in the neck.  Pt notes it is continued on the R side and she has found slight relief following therapy sessions.  Pt notes that the pain is still most present in the morning due to not being able to find comfort when sleeping.      PERTINENT HISTORY:  63yoF referred to OPPT by neurology for chronic neck pain/occipital neuralgia on the right, with neck movement shooting pain up the back of the right head. Also reports history of  migraine, not very frequent. Pt has been started on Neurontin  by neurology which is helping some. Neurology  recommending injections for this in coming weeks. Pt reports similar problem over the last 3-5 years, managed well by her husband who is a Land, would have periods of remittance, however pt reports he lives out of state at present. 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 PMH: HA, Vit D deficiency, HLD, hashimoto's disease, OSA, HTN, B12 def, hypoTSH, GAD.  PAIN:  Are you having pain? 0 at present, 4/10 shooting occipital nerve pain on Right with certain head movements. PRECAUTIONS: None WEIGHT BEARING RESTRICTIONS: No FALLS:  Has patient fallen in last 6 months? Yes 1 OCCUPATION: Outpatient pharmacist for [employer]  PLOF: fully inependent  PATIENT GOALS: stop shooting nerve pain   OBJECTIVE:  Note: Objective measures were completed at Evaluation unless otherwise noted.  DIAGNOSTIC FINDINGS:  MRI: per neurology, no concerning finding related to occipital neuralgia, general age related cervical spondylosis.   PATIENT SURVEYS:  NDI: 30%  POSTURE: rigid mid to upper thoracic kyphosis with slight forward head posturing, pec tightness with Left > Rt scapular protraction at rest.   CERVICAL SPINE MOBILITY:   Active ROM A/PROM (deg) eval  Extension (degrees to LOG) 40*  Right lateral flexion 14*  Left lateral flexion 14*  Right rotation 55*  Left rotation 60*   (* = end range pain)  TREATMENT DATE 04/02/24 :                                                                                                                                  Manual therapy:   Supine STM to cervical region to increase extensibility of the paraspinals Supine suboccipital release technique to decrease cervicalgia Supine manual traction performed in order to increase joint space in cervical region for pain relief Supine cervical upglides/downglides, 30 sec bouts Supine UT/Levator stretch, 30 sec bouts to increase tissue  extensibility of the cervical region Supine R shoulder distraction for increased joint spacing, as pt has slightly reduced ROM on the R compared to the L, 30 sec bouts x4 attempts   TherEx:  Standing monster walks at wall with GTB around wrists, 2x10  Standing shoulder W's, 2x10 Standing wall angels, 2x10 Standing forward wall slides with serratus lift off, 2x10  Pt educated throughout session about  proper posture and technique with exercises. Improved exercise technique, movement at target joints, use of target muscles after min to mod verbal, visual, tactile cues   PATIENT EDUCATION:  Education details: Review of HEP and provided handout today. Person educated: Koren Education method: collaborative learning, deliberate practice, positive reinforcement, explicit instruction, establish rules. Education comprehension: generally good   HOME EXERCISE PROGRAM: Access Code: [email or web address] URL: [email or web address] Date: 03/19/2024 Prepared by: Peggye Linear & Sidra Simpers  Exercises - Standing Row with Anchored Resistance  - 3 x daily - 1 sets - 15 reps - 3sec hold - Standing Shoulder External Rotation with Resistance  - 3 x daily - 1 sets - 15 reps - 3sec hold - Supine Cervical Retraction with Towel  - 3 x daily - 1 sets - 15 reps - 3sec hold - Supine Thoracic Mobilization Towel Roll Vertical with Arm Stretch  - 2 x daily - 7 x weekly - 1 sets - 3 reps - 60sec hold - Wall Angels  - 1 x daily - 7 x weekly - 3 sets - 10 reps - Serratus Activation at Wall  - 1 x daily - 7 x weekly - 3 sets - 10 reps  ASSESSMENT:  CLINICAL IMPRESSION:  Pt responded well to the exercises and was able to demonstrate with increased resistance by applying the GTB.  Pt ultimately still having some postural limitations and significant tightness noted in the R cervical paraspinals.  Pt educated on dry needling at this time and may consider it for the next session, however due to pt potentially  receiving an injection today, dry needling was held off.   Pt will continue to benefit from skilled therapy to address remaining deficits in order to improve overall QoL and return to PLOF.     OBJECTIVE IMPAIRMENTS: Decreased knowledge of condition, decreased use of DME, decreased mobility, difficulty walking, decreased strength, decreased ROM. ACTIVITY LIMITATIONS: Lifting, standing, walking, squatting, transfers, locomotion level PARTICIPATION LIMITATIONS: Cleaning, laundry, interpersonal relationships, driving, yardwork, community activity.  PERSONAL FACTORS: Age, behavior pattern, education, past/current experiences, transportation, profession are also affecting patient's functional outcome.  REHAB POTENTIAL: Good  CLINICAL DECISION MAKING: Moderate  EVALUATION COMPLEXITY: Medium    GOALS: Goals reviewed with patient? Yes  SHORT TERM GOALS: Target date: 04/19/24  Pt to demonstrate improved cervical extension 50 degrees (standing to LOG) and rotation> 60 degrees bilat all without shooting nerve pain.  Baseline:  Goal status: INITIAL  2.  Pt to have a decrease in self reported disability pertaining to neck pain AEB reduced NDI score >10%.  Baseline:  Goal status: INITIAL  LONG TERM GOALS: Target date: 05/19/24  Pt to have a decrease in self reported disability pertaining to neck pain AEB reduced NDI score >20%. Baseline: 30%  Goal status: INITIAL  2.  Pt to demonstrate cervical rotation ROM >70 degrees bilat without shooting nerve pain to improve safety and performance in driving a motor vehicle.  Baseline:  Goal status: INITIAL  3.  Pt to have reduced muscle tightness and adaptive muscle shortening AEB cervical sidebedning >24 degrees bilat without pain.  Baseline:  Goal status: INITIAL  4.  Pt to report confidence and consistency with long term HEP for DC to promote continued thoracic extension mobility and scapulothoracic strengthening to reduce recurrent of symptoms and to  promote age-related bone density health.  Baseline:  Goal status: INITIAL PLAN:  PT FREQUENCY: 1-2x/week  PT DURATION: 6 weeks PLANNED INTERVENTIONS: 97110-Therapeutic exercises, 97530- Therapeutic activity, [id number]- Neuromuscular re-education, 97535-  Self Care, 02859- Manual therapy, [id number]- Electrical stimulation (unattended), [phone]- Electrical stimulation (manual), [phone] (1-2 muscles), [id number] (3+ muscles)- Dry Needling, Patient/Family education, Joint mobilization, Spinal mobilization, and Moist heat  PLAN FOR NEXT SESSION:  Continue to review and progress HEP for technique and symptoms response Continue to advance scapulothoracic strengthening and ROM.  Assess for trigger point dry needling.  Fonda Simpers, PT, DPT Physical Therapist - [facility]  04/02/24, 8:05 AM

## 2024-04-09 ENCOUNTER — Encounter

## 2024-04-16 ENCOUNTER — Ambulatory Visit: Attending: Neurology

## 2024-04-16 DIAGNOSIS — G4486 Cervicogenic headache: Secondary | ICD-10-CM | POA: Diagnosis not present

## 2024-04-16 DIAGNOSIS — M5481 Occipital neuralgia: Secondary | ICD-10-CM | POA: Insufficient documentation

## 2024-04-16 NOTE — Therapy (Signed)
 OUTPATIENT PHYSICAL THERAPY TREATMENT  Patient Name: Katherine Smith MRN: [id number] DOB:[birth year], 63 y.o., female Today's Date: 04/16/2024  END OF SESSION:  PT End of Session - 04/16/24 0806     Visit Number 5    Number of Visits 12    Date for PT Re-Evaluation 04/30/24    Authorization Type Katherine Smith [employer]    Authorization Time Period 03/19/24-04/30/24    Progress Note Due on Visit 10    PT Start Time 0805    PT Stop Time 0845    PT Time Calculation (min) 40 min    Equipment Utilized During Treatment Gait belt    Activity Tolerance Patient tolerated treatment well          Past Medical History:  Diagnosis Date   Anxiety    Back pain    Cancer (HCC)    BASAL CELL SKIN CANCER   Depression    Fatty liver    GERD (gastroesophageal reflux disease)    Hashimoto's thyroiditis    HTN (hypertension)    Hyperlipidemia    Hypothyroidism    IBS (irritable bowel syndrome)    Insomnia    Joint pain    Lactose intolerance    Lichen sclerosus    Obesity    Psoriasis    Sleep apnea    Past Surgical History:  Procedure Laterality Date   ANTERIOR DISC ARTHROPLASTY     2004   CATARACT EXTRACTION W/ INTRAOCULAR LENS IMPLANT Bilateral    COLONOSCOPY  12/18/2011   COLONOSCOPY N/A 11/29/2022   Procedure: COLONOSCOPY;  Surgeon: Toledo, Ladell POUR, MD;  Location: [facility] ENDOSCOPY;  Service: Gastroenterology;  Laterality: N/A;  DM   COLONOSCOPY WITH PROPOFOL  N/A 04/04/2017   Procedure: COLONOSCOPY WITH PROPOFOL ;  Surgeon: Viktoria Lamar DASEN, MD;  Location: [facility] ENDOSCOPY;  Service: Endoscopy;  Laterality: N/A;   SHOULDER ARTHROSCOPY WITH ROTATOR CUFF REPAIR AND SUBACROMIAL DECOMPRESSION Left 07/12/2020   Procedure: SHOULDER ARTHROSCOPY WITH ARTHROSCOPIC  ROTATOR CUFF REPAIR AND SUBACROMIAL DECOMPRESSION;  Surgeon: Dozier Soulier, MD;  Location: [facility];  Service: Orthopedics;  Laterality: Left;   Patient Active Problem List   Diagnosis Date Noted   Chronic  intractable headache 11/12/2023   Stress 02/14/2023   Polyphagia 10/11/2022   Obesity, Beginning BMI 42.51 09/06/2022   Acquired hypothyroidism 08/29/2022   GERD (gastroesophageal reflux disease) 08/29/2022   Lichen sclerosus 08/29/2022   GAD (generalized anxiety disorder) 09/01/2020   Depression 01/08/2020   Primary insomnia 07/31/2018   Class 3 severe obesity with serious comorbidity and body mass index (BMI) of 40.0 to 44.9 in adult 02/07/2017   Elevated liver function tests 11/23/2016   B12 nutritional deficiency 11/23/2016   Essential hypertension 10/25/2016   Prediabetes 10/25/2016   BMI 33.0-33.9,adult 09/26/2016   Hashimoto's thyroiditis 09/07/2016   Sleep apnea 09/07/2016   Insulin  resistance 08/24/2016   Vitamin D  deficiency 08/24/2016   Hyperlipidemia 08/24/2016   Morbid obesity (HCC) 08/24/2016    PCP: Alla Amis, MD REFERRING PROVIDER: Onetha Epp, MD (neurology)  REFERRING DIAG: Occipital neuralgia   THERAPY DIAG:  Occipital neuralgia of right side  Cervicogenic headache  Rationale for Evaluation and Treatment: Rehabilitation  ONSET DATE: Chronic recurrent  SUBJECTIVE:  SUBJECTIVE STATEMENT:  Pt reports 5/10 pain today, which is unusual for her.  Pt reports she did not get the injection a couple of weeks ago and is scheduled to have it done today at 5 PM.       PERTINENT HISTORY:  63yoF referred to OPPT by neurology for chronic neck pain/occipital neuralgia on the right, with neck movement shooting pain up the back of the right head. Also reports history of  migraine, not very frequent. Pt has been started on Neurontin  by neurology which is helping some. Neurology recommending injections for this in coming weeks. Pt reports similar problem over the last  3-5 years, managed well by her husband who is a Land, would have periods of remittance, however pt reports he lives out of state at present. 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 PMH: HA, Vit D deficiency, HLD, hashimoto's disease, OSA, HTN, B12 def, hypoTSH, GAD.  PAIN:  Are you having pain? 0 at present, 4/10 shooting occipital nerve pain on Right with certain head movements. PRECAUTIONS: None WEIGHT BEARING RESTRICTIONS: No FALLS:  Has patient fallen in last 6 months? 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  DIAGNOSTIC FINDINGS:  MRI: per neurology, no concerning finding related to occipital neuralgia, general age related cervical spondylosis.   PATIENT SURVEYS:  NDI: 30%  POSTURE: rigid mid to upper thoracic kyphosis with slight forward head posturing, pec tightness with Left > Rt scapular protraction at rest.   CERVICAL SPINE MOBILITY:   Active ROM A/PROM (deg) eval  Extension (degrees to LOG) 40*  Right lateral flexion 14*  Left lateral flexion 14*  Right rotation 55*  Left rotation 60*   (* = end range pain)  TREATMENT DATE 04/16/24 :                                                                                                                                  Manual therapy:   Supine STM to cervical region to increase extensibility of the paraspinals Supine suboccipital release technique to decrease cervicalgia Supine manual traction performed in order to increase joint space in cervical region for pain relief Supine cervical upglides/downglides, 30 sec bouts Supine UT/Levator stretch, 30 sec bouts to increase tissue extensibility of the cervical region Supine R shoulder distraction for increased joint spacing,  as pt has slightly reduced ROM on the R compared to the L, 30 sec bouts x4 attempts   Trigger Point Dry Needling  What is Trigger Point Dry Needling (DN)? DN is a physical therapy technique used to treat muscle pain and dysfunction. Specifically, DN helps deactivate muscle trigger points (muscle knots).  A thin filiform needle is used to penetrate the skin and stimulate the underlying trigger  point. The goal is for a local twitch response (LTR) to occur and for the trigger point to relax. No medication of any kind is injected during the procedure.  Benefits of Trigger Point Dry Needling Reduces localized tension and stiffness promoting muscle function. Promotes range of motion and flexibility of the area being treated. Relieves localized and referred muscle related pain.  Promotes localized blood flow. Benefits of DN increase when performed with formal therapy including strengthening and stretching.   What Does Trigger Point Dry Needling Feel Like?  The procedure feels different for each individual patient. Some patients report that they do not actually feel the needle enter the skin and overall the process is not painful. Very mild bleeding may occur. However, many patients feel a deep cramping in the muscle in which the needle was inserted. This is the local twitch response.   How Will I feel after the treatment? Soreness is normal, and the onset of soreness may not occur for a few hours. Typically this soreness does not last longer than two days.  Bruising is uncommon, however; ice can be used to decrease any possible bruising.  In rare cases feeling tired or nauseous after the treatment is normal. In addition, your symptoms may get worse before they get better, this period will typically not last longer than 24 hours.   What Can I do After My Treatment? Increase your hydration by drinking more water for the next 24 hours.  You may place ice or heat on the areas treated that have become  sore, however, do not use heat on inflamed or bruised areas. Heat often brings more relief post needling. You can continue your regular activities, but vigorous activity is not recommended initially after the treatment for 24 hours. DN is best combined with other physical therapy such as strengthening, stretching, and other therapies.   What are the complications? While your therapist has had extensive training in minimizing the risks of trigger point dry needling, it is important to understand the risks of any procedure.  Risks include bleeding, pain, fatigue, hematoma, infection, vertigo, nausea or nerve involvement. Monitor for any changes to your skin or sensation. Contact your therapist or MD with concerns.  A rare but serious complication is a pneumothorax over or near your middle and upper chest and back If you have dry needling in this area, monitor for the following symptoms: Shortness of breath on exertion and/or Difficulty taking a deep breath and/or Chest Pain and/or A dry cough If any of the above symptoms develop, please go to the nearest emergency room or call 911. Tell them you had dry needling over your thorax and report any symptoms you are having. Please follow-up with your treating therapist after you complete the medical evaluation.  Trigger Point Dry Needling  Initial Treatment: Pt instructed on Dry Needling rational, procedures, and possible side effects. Pt instructed to expect mild to moderate muscle soreness later in the day and/or into the next day.  Pt instructed in methods to reduce muscle soreness. Pt instructed to continue prescribed HEP. Because Dry Needling was performed over or adjacent to a lung field, pt was educated on S/S of pneumothorax and to seek immediate medical attention should they occur.  Patient was educated on signs and symptoms of infection and other risk factors and advised to seek medical attention should they occur.  Patient verbalized  understanding of these instructions and education.   Patient Verbal Consent Given: Yes Education Handout Provided: Yes Muscles Treated: B  UT's Electrical Stimulation Performed: No Treatment Response/Outcome: Pt with significant muscle twitch bilaterally, however multiple muscle twitches on the R side which was noted to be much more tight.     Pt educated throughout session about proper posture and technique with exercises. Improved exercise technique, movement at target joints, use of target muscles after min to mod verbal, visual, tactile cues   PATIENT EDUCATION:  Education details: Review of HEP and provided handout today. Person educated: Katherine Smith Education method: collaborative learning, deliberate practice, positive reinforcement, explicit instruction, establish rules. Education comprehension: generally good   HOME EXERCISE PROGRAM: Access Code: [email or web address] URL: [email or web address] Date: 03/19/2024 Prepared by: Katherine Smith & Katherine Smith  Exercises - Standing Row with Anchored Resistance  - 3 x daily - 1 sets - 15 reps - 3sec hold - Standing Shoulder External Rotation with Resistance  - 3 x daily - 1 sets - 15 reps - 3sec hold - Supine Cervical Retraction with Towel  - 3 x daily - 1 sets - 15 reps - 3sec hold - Supine Thoracic Mobilization Towel Roll Vertical with Arm Stretch  - 2 x daily - 7 x weekly - 1 sets - 3 reps - 60sec hold - Wall Angels  - 1 x daily - 7 x weekly - 3 sets - 10 reps - Serratus Activation at Wall  - 1 x daily - 7 x weekly - 3 sets - 10 reps  ASSESSMENT:  CLINICAL IMPRESSION:  Pt responded well to the manual therapy approach along with the dry needling.  Pt noted a reduction in the tenderness and the tightness that she was experiencing upon  arrival to the clinic.  Pt received educated regarding the dry needling method and the precautions and voice understanding.  Pt will be re-assessed at the next visit to see if injections and dry  needling assisted with her symptoms.   Pt will continue to benefit from skilled therapy to address remaining deficits in order to improve overall QoL and return to PLOF.       OBJECTIVE IMPAIRMENTS: Decreased knowledge of condition, decreased use of DME, decreased mobility, difficulty walking, decreased strength, decreased ROM. ACTIVITY LIMITATIONS: Lifting, standing, walking, squatting, transfers, locomotion level PARTICIPATION LIMITATIONS: Cleaning, laundry, interpersonal relationships, driving, yardwork, community activity.  PERSONAL FACTORS: Age, behavior pattern, education, past/current experiences, transportation, profession are also affecting patient's functional outcome.  REHAB POTENTIAL: Good  CLINICAL DECISION MAKING: Moderate  EVALUATION COMPLEXITY: Medium    GOALS: Goals reviewed with patient? Yes  SHORT TERM GOALS: Target date: 04/19/24  Pt to demonstrate improved cervical extension 50 degrees (standing to LOG) and rotation> 60 degrees bilat all without shooting nerve pain.  Baseline:  Goal status: INITIAL  2.  Pt to have a decrease in self reported disability pertaining to neck pain AEB reduced NDI score >10%.  Baseline:  Goal status: INITIAL  LONG TERM GOALS: Target date: 05/19/24  Pt to have a decrease in self reported disability pertaining to neck pain AEB reduced NDI score >20%. Baseline: 30%  Goal status: INITIAL  2.  Pt to demonstrate cervical rotation ROM >70 degrees bilat without shooting nerve pain to improve safety and performance in driving a motor vehicle.  Baseline:  Goal status: INITIAL  3.  Pt to have reduced muscle tightness and adaptive muscle shortening AEB cervical sidebedning >24 degrees bilat without pain.  Baseline:  Goal status: INITIAL  4.  Pt to report confidence and consistency with long term HEP for DC to promote continued thoracic extension mobility  and scapulothoracic strengthening to reduce recurrent of symptoms and to promote  age-related bone density health.  Baseline:  Goal status: INITIAL PLAN:  PT FREQUENCY: 1-2x/week  PT DURATION: 6 weeks PLANNED INTERVENTIONS: 97110-Therapeutic exercises, 97530- Therapeutic activity, [id number]- Neuromuscular re-education, 97535- Self Care, [id number]- Manual therapy, [id number]- Electrical stimulation (unattended), [phone]- Electrical stimulation (manual), 20560 (1-2 muscles), 20561 (3+ muscles)- Dry Needling, Patient/Family education, Joint mobilization, Spinal mobilization, and Moist heat  PLAN FOR NEXT SESSION:  Continue to review and progress HEP for technique and symptoms response Continue to advance scapulothoracic strengthening and ROM.  Assess for trigger point dry needling.   Fonda Smith, PT, DPT Physical Therapist - [name]  04/16/24, 10:46 AM

## 2024-04-21 ENCOUNTER — Ambulatory Visit

## 2024-04-22 ENCOUNTER — Ambulatory Visit

## 2024-04-23 ENCOUNTER — Ambulatory Visit (INDEPENDENT_AMBULATORY_CARE_PROVIDER_SITE_OTHER): Admitting: Family Medicine

## 2024-04-23 ENCOUNTER — Ambulatory Visit

## 2024-04-23 ENCOUNTER — Ambulatory Visit: Admitting: Physical Therapy

## 2024-04-23 ENCOUNTER — Encounter (INDEPENDENT_AMBULATORY_CARE_PROVIDER_SITE_OTHER): Payer: Self-pay | Admitting: Family Medicine

## 2024-04-23 ENCOUNTER — Other Ambulatory Visit: Payer: Self-pay

## 2024-04-23 VITALS — BP 120/80 | HR 83 | Temp 98.0°F | Ht 63.0 in | Wt 195.0 lb

## 2024-04-23 DIAGNOSIS — E669 Obesity, unspecified: Secondary | ICD-10-CM

## 2024-04-23 DIAGNOSIS — G4486 Cervicogenic headache: Secondary | ICD-10-CM

## 2024-04-23 DIAGNOSIS — R632 Polyphagia: Secondary | ICD-10-CM

## 2024-04-23 DIAGNOSIS — F5089 Other specified eating disorder: Secondary | ICD-10-CM

## 2024-04-23 DIAGNOSIS — E559 Vitamin D deficiency, unspecified: Secondary | ICD-10-CM | POA: Diagnosis not present

## 2024-04-23 DIAGNOSIS — Z6834 Body mass index (BMI) 34.0-34.9, adult: Secondary | ICD-10-CM | POA: Diagnosis not present

## 2024-04-23 DIAGNOSIS — F3289 Other specified depressive episodes: Secondary | ICD-10-CM

## 2024-04-23 DIAGNOSIS — M5481 Occipital neuralgia: Secondary | ICD-10-CM | POA: Diagnosis not present

## 2024-04-23 DIAGNOSIS — E66813 Obesity, class 3: Secondary | ICD-10-CM

## 2024-04-23 MED ORDER — VITAMIN D (ERGOCALCIFEROL) 1.25 MG (50000 UNIT) PO CAPS
50000.0000 [IU] | ORAL_CAPSULE | ORAL | 0 refills | Status: DC
  Filled 2024-04-23: qty 12, 84d supply, fill #0

## 2024-04-23 MED ORDER — ZEPBOUND 15 MG/0.5ML ~~LOC~~ SOAJ
15.0000 mg | SUBCUTANEOUS | 0 refills | Status: DC
  Filled 2024-04-23: qty 2, 28d supply, fill #0
  Filled 2024-05-25: qty 2, 28d supply, fill #1
  Filled 2024-07-05: qty 2, 28d supply, fill #2

## 2024-04-23 MED ORDER — ESCITALOPRAM OXALATE 20 MG PO TABS
20.0000 mg | ORAL_TABLET | Freq: Every morning | ORAL | 0 refills | Status: DC
  Filled 2024-04-23: qty 90, 90d supply, fill #0

## 2024-04-23 NOTE — Therapy (Unsigned)
 OUTPATIENT PHYSICAL THERAPY TREATMENT  Patient Name: Katherine Smith MRN: [id number] DOB:1960-09-23, 63 y.o., female Today's Date: 04/23/2024  END OF SESSION:  PT End of Session - 04/23/24 1018     Visit Number 6    Number of Visits 12    Date for PT Re-Evaluation 04/30/24    Authorization Type [employer]    Authorization Time Period 03/19/24-04/30/24    Progress Note Due on Visit 10    PT Start Time 1020    PT Stop Time 1100    PT Time Calculation (min) 40 min    Equipment Utilized During Treatment Gait belt    Activity Tolerance Patient tolerated treatment well          Past Medical History:  Diagnosis Date   Anxiety    Back pain    Cancer (HCC)    BASAL CELL SKIN CANCER   Depression    Fatty liver    GERD (gastroesophageal reflux disease)    Hashimoto's thyroiditis    HTN (hypertension)    Hyperlipidemia    Hypothyroidism    IBS (irritable bowel syndrome)    Insomnia    Joint pain    Lactose intolerance    Lichen sclerosus    Obesity    Psoriasis    Sleep apnea    Past Surgical History:  Procedure Laterality Date   ANTERIOR DISC ARTHROPLASTY     2004   CATARACT EXTRACTION W/ INTRAOCULAR LENS IMPLANT Bilateral    COLONOSCOPY  12/18/2011   COLONOSCOPY N/A 11/29/2022   Procedure: COLONOSCOPY;  Surgeon: Toledo, Ladell POUR, MD;  Location: [facility] ENDOSCOPY;  Service: Gastroenterology;  Laterality: N/A;  DM   COLONOSCOPY WITH PROPOFOL  N/A 04/04/2017   Procedure: COLONOSCOPY WITH PROPOFOL ;  Surgeon: Viktoria Lamar DASEN, MD;  Location: [facility] ENDOSCOPY;  Service: Endoscopy;  Laterality: N/A;   SHOULDER ARTHROSCOPY WITH ROTATOR CUFF REPAIR AND SUBACROMIAL DECOMPRESSION Left 07/12/2020   Procedure: SHOULDER ARTHROSCOPY WITH ARTHROSCOPIC  ROTATOR CUFF REPAIR AND SUBACROMIAL DECOMPRESSION;  Surgeon: Dozier Soulier, MD;  Location: [facility];  Service: Orthopedics;  Laterality: Left;   Patient Active Problem List   Diagnosis Date Noted   Chronic  intractable headache 11/12/2023   Stress 02/14/2023   Polyphagia 10/11/2022   Obesity, Beginning BMI 42.51 09/06/2022   Acquired hypothyroidism 08/29/2022   GERD (gastroesophageal reflux disease) 08/29/2022   Lichen sclerosus 08/29/2022   GAD (generalized anxiety disorder) 09/01/2020   Depression 01/08/2020   Primary insomnia 07/31/2018   Class 3 severe obesity with serious comorbidity and body mass index (BMI) of 40.0 to 44.9 in adult 02/07/2017   Elevated liver function tests 11/23/2016   B12 nutritional deficiency 11/23/2016   Essential hypertension 10/25/2016   Prediabetes 10/25/2016   BMI 33.0-33.9,adult 09/26/2016   Hashimoto's thyroiditis 09/07/2016   Sleep apnea 09/07/2016   Insulin  resistance 08/24/2016   Vitamin D  deficiency 08/24/2016   Hyperlipidemia 08/24/2016   Morbid obesity (HCC) 08/24/2016    PCP: Alla Amis, MD REFERRING PROVIDER: Onetha Epp, MD (neurology)  REFERRING DIAG: Occipital neuralgia   THERAPY DIAG:  Occipital neuralgia of right side  Cervicogenic headache  Rationale for Evaluation and Treatment: Rehabilitation  ONSET DATE: Chronic recurrent  SUBJECTIVE:  SUBJECTIVE STATEMENT:  Pt reports that she is doing pretty good.  reports that pain still 3/10 in the suboccipital region. States that she had injection for pain management on 9/3 following PT treatment, and was hoping for complete relief, but it is doing better.     PERTINENT HISTORY:  63yoF referred to OPPT by neurology for chronic neck pain/occipital neuralgia on the right, with neck movement shooting pain up the back of the right head. Also reports history of  migraine, not very frequent. Pt has been started on Neurontin  by neurology which is helping some. Neurology recommending  injections for this in coming weeks. Pt reports similar problem over the last 3-5 years, managed well by her husband who is a Land, would have periods of remittance, however pt reports he lives out of state at present. 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 PMH: HA, Vit D deficiency, HLD, hashimoto's disease, OSA, HTN, B12 def, hypoTSH, GAD.  PAIN:  Are you having pain? 0 at present, 4/10 shooting occipital nerve pain on Right with certain head movements. PRECAUTIONS: None WEIGHT BEARING RESTRICTIONS: No FALLS:  Has patient fallen in last 6 months? 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  DIAGNOSTIC FINDINGS:  MRI: per neurology, no concerning finding related to occipital neuralgia, general age related cervical spondylosis.   PATIENT SURVEYS:  NDI: 30%  POSTURE: rigid mid to upper thoracic kyphosis with slight forward head posturing, pec tightness with Left > Rt scapular protraction at rest.   CERVICAL SPINE MOBILITY:   Active ROM A/PROM (deg) eval  Extension (degrees to LOG) 40*  Right lateral flexion 14*  Left lateral flexion 14*  Right rotation 55*  Left rotation 60*   (* = end range pain)  TREATMENT DATE 04/23/24 :                                                                                                                                  Manual therapy:   Supine STM to cervical region to increase extensibility of the paraspinals Supine suboccipital release technique to decrease cervicalgia Supine manual traction performed in order to increase joint space in cervical region for pain relief Supine cervical upglides/downglides, 30 sec bouts Supine UT/Levator stretch, 30 sec bouts to increase tissue extensibility of the  cervical region Supine R shoulder distraction for increased joint spacing, as pt has slightly reduced ROM on the R compared to the L, 30 sec bouts x4 attempts   Trigger Point Dry Needling  What is Trigger Point Dry Needling (DN)? DN is a physical therapy technique used to treat muscle pain and dysfunction. Specifically, DN helps deactivate muscle trigger points (muscle knots).  A thin filiform needle is used to penetrate the skin  and stimulate the underlying trigger point. The goal is for a local twitch response (LTR) to occur and for the trigger point to relax. No medication of any kind is injected during the procedure.  Benefits of Trigger Point Dry Needling Reduces localized tension and stiffness promoting muscle function. Promotes range of motion and flexibility of the area being treated. Relieves localized and referred muscle related pain.  Promotes localized blood flow. Benefits of DN increase when performed with formal therapy including strengthening and stretching.   What Does Trigger Point Dry Needling Feel Like?  The procedure feels different for each individual patient. Some patients report that they do not actually feel the needle enter the skin and overall the process is not painful. Very mild bleeding may occur. However, many patients feel a deep cramping in the muscle in which the needle was inserted. This is the local twitch response.   How Will I feel after the treatment? Soreness is normal, and the onset of soreness may not occur for a few hours. Typically this soreness does not last longer than two days.  Bruising is uncommon, however; ice can be used to decrease any possible bruising.  In rare cases feeling tired or nauseous after the treatment is normal. In addition, your symptoms may get worse before they get better, this period will typically not last longer than 24 hours.   What Can I do After My Treatment? Increase your hydration by drinking more water for the next  24 hours.  You may place ice or heat on the areas treated that have become sore, however, do not use heat on inflamed or bruised areas. Heat often brings more relief post needling. You can continue your regular activities, but vigorous activity is not recommended initially after the treatment for 24 hours. DN is best combined with other physical therapy such as strengthening, stretching, and other therapies.   What are the complications? While your therapist has had extensive training in minimizing the risks of trigger point dry needling, it is important to understand the risks of any procedure.  Risks include bleeding, pain, fatigue, hematoma, infection, vertigo, nausea or nerve involvement. Monitor for any changes to your skin or sensation. Contact your therapist or MD with concerns.  A rare but serious complication is a pneumothorax over or near your middle and upper chest and back If you have dry needling in this area, monitor for the following symptoms: Shortness of breath on exertion and/or Difficulty taking a deep breath and/or Chest Pain and/or A dry cough If any of the above symptoms develop, please go to the nearest emergency room or call 911. Tell them you had dry needling over your thorax and report any symptoms you are having. Please follow-up with your treating therapist after you complete the medical evaluation.  Trigger Point Dry Needling  Initial Treatment: Pt instructed on Dry Needling rational, procedures, and possible side effects. Pt instructed to expect mild to moderate muscle soreness later in the day and/or into the next day.  Pt instructed in methods to reduce muscle soreness. Pt instructed to continue prescribed HEP. Because Dry Needling was performed over or adjacent to a lung field, pt was educated on S/S of pneumothorax and to seek immediate medical attention should they occur.  Patient was educated on signs and symptoms of infection and other risk factors and  advised to seek medical attention should they occur.  Patient verbalized understanding of these instructions and education.   Patient Verbal Consent Given: Yes Education Handout  Provided: Yes Muscles Treated: B UT's Electrical Stimulation Performed: No Treatment Response/Outcome: Pt with significant muscle twitch bilaterally, however multiple muscle twitches on the R side which was noted to be much more tight.     Pt educated throughout session about proper posture and technique with exercises. Improved exercise technique, movement at target joints, use of target muscles after min to mod verbal, visual, tactile cues   PATIENT EDUCATION:  Education details: Review of HEP and provided handout today. Person educated: Kailah Education method: collaborative learning, deliberate practice, positive reinforcement, explicit instruction, establish rules. Education comprehension: generally good   HOME EXERCISE PROGRAM: Access Code: [email or web address] URL: [email or web address] Date: 03/19/2024 Prepared by: Peggye Linear & Sidra Simpers  Exercises - Standing Row with Anchored Resistance  - 3 x daily - 1 sets - 15 reps - 3sec hold - Standing Shoulder External Rotation with Resistance  - 3 x daily - 1 sets - 15 reps - 3sec hold - Supine Cervical Retraction with Towel  - 3 x daily - 1 sets - 15 reps - 3sec hold - Supine Thoracic Mobilization Towel Roll Vertical with Arm Stretch  - 2 x daily - 7 x weekly - 1 sets - 3 reps - 60sec hold - Wall Angels  - 1 x daily - 7 x weekly - 3 sets - 10 reps - Serratus Activation at Wall  - 1 x daily - 7 x weekly - 3 sets - 10 reps  ASSESSMENT:  CLINICAL IMPRESSION:  Pt responded well to the manual therapy approach along with the dry needling.  Pt noted a reduction in the tenderness and the tightness that she was experiencing upon  arrival to the clinic.  Pt received educated regarding the dry needling method and the precautions and voice understanding.   Pt will be re-assessed at the next visit to see if injections and dry needling assisted with her symptoms.   Pt will continue to benefit from skilled therapy to address remaining deficits in order to improve overall QoL and return to PLOF.       OBJECTIVE IMPAIRMENTS: Decreased knowledge of condition, decreased use of DME, decreased mobility, difficulty walking, decreased strength, decreased ROM. ACTIVITY LIMITATIONS: Lifting, standing, walking, squatting, transfers, locomotion level PARTICIPATION LIMITATIONS: Cleaning, laundry, interpersonal relationships, driving, yardwork, community activity.  PERSONAL FACTORS: Age, behavior pattern, education, past/current experiences, transportation, profession are also affecting patient's functional outcome.  REHAB POTENTIAL: Good  CLINICAL DECISION MAKING: Moderate  EVALUATION COMPLEXITY: Medium    GOALS: Goals reviewed with patient? Yes  SHORT TERM GOALS: Target date: 04/19/24  Pt to demonstrate improved cervical extension 50 degrees (standing to LOG) and rotation> 60 degrees bilat all without shooting nerve pain.  Baseline:  Goal status: INITIAL  2.  Pt to have a decrease in self reported disability pertaining to neck pain AEB reduced NDI score >10%.  Baseline:  Goal status: INITIAL  LONG TERM GOALS: Target date: 05/19/24  Pt to have a decrease in self reported disability pertaining to neck pain AEB reduced NDI score >20%. Baseline: 30%  Goal status: INITIAL  2.  Pt to demonstrate cervical rotation ROM >70 degrees bilat without shooting nerve pain to improve safety and performance in driving a motor vehicle.  Baseline:  Goal status: INITIAL  3.  Pt to have reduced muscle tightness and adaptive muscle shortening AEB cervical sidebedning >24 degrees bilat without pain.  Baseline:  Goal status: INITIAL  4.  Pt to report confidence and consistency with long term HEP for DC to  promote continued thoracic extension mobility and scapulothoracic  strengthening to reduce recurrent of symptoms and to promote age-related bone density health.  Baseline:  Goal status: INITIAL PLAN:  PT FREQUENCY: 1-2x/week  PT DURATION: 6 weeks PLANNED INTERVENTIONS: 97110-Therapeutic exercises, 97530- Therapeutic activity, [id number]- Neuromuscular re-education, 97535- Self Care, [id number]- Manual therapy, [id number]- Electrical stimulation (unattended), [phone]- Electrical stimulation (manual), 20560 (1-2 muscles), 20561 (3+ muscles)- Dry Needling, Patient/Family education, Joint mobilization, Spinal mobilization, and Moist heat  PLAN FOR NEXT SESSION:  Continue to review and progress HEP for technique and symptoms response Continue to advance scapulothoracic strengthening and ROM.  Assess for trigger point dry needling.   Fonda Simpers, PT, DPT Physical Therapist - [facility]  04/23/24, 10:19 AM

## 2024-04-23 NOTE — Progress Notes (Signed)
 Office: [phone]  /  Fax: [phone]  WEIGHT SUMMARY AND BIOMETRICS  Anthropometric Measurements Height: 5' 3 (1.6 m) Weight: 195 lb (88.5 kg) BMI (Calculated): 34.55 Weight at Last Visit: 192 lb Weight Lost Since Last Visit: 0 Weight Gained Since Last Visit: 3 lb Starting Weight: 240 lb Total Weight Loss (lbs): 45 lb (20.4 kg) Peak Weight: 255 lb   Body Composition  Body Fat %: 43.8 % Fat Mass (lbs): 85.4 lbs Muscle Mass (lbs): 104.2 lbs Total Body Water (lbs): 74.8 lbs Visceral Fat Rating : 13   Other Clinical Data Fasting: no Labs: no Today's Visit #: 76 Starting Date: 08/10/16    Chief Complaint: OBESITY   History of Present Illness Katherine Smith is a 63 year old female who presents for obesity treatment and progress assessment.  She has been following the category two ED plan about sixty percent of the time and has gained three pounds in the last month since her last visit. She is not currently exercising and engages in emotional eating, particularly comfort eating at night. She acknowledges a lack of discipline with her diet and has gained approximately fifteen pounds from her lowest weight of 178 pounds. She attempts to maintain her protein intake, consuming twenty to thirty grams with each meal. Stress and comfort eating have contributed to her weight gain.  She is currently being treated for vitamin D  deficiency with ergocalciferol  50,000 IU weekly and requests a refill. She is also on Lexapro  20 mg in the morning for emotional eating behavior and requests a refill. Additionally, she takes Zypram 15 mg weekly for polyphagia.  She experiences ongoing headaches and has received an injection of ropivacaine and dexamethasone  into the occipital nerve a week ago for occipital neuralgia, noting slight improvement. She mentions knots in her scapula area contributing to headaches and has undergone dry needling therapy once, which she believes will be  beneficial.  She has been taking Neurontin  but is trying to discontinue it due to side effects, including lack of motivation and weight gain. She feels unmotivated to exercise due to neck issues, including arthritis and bulging discs. Her life is stressful, with work being particularly demanding, and she is considering retirement or part-time work in the near future.      PHYSICAL EXAM:  Blood pressure 120/80, pulse 83, temperature 98 F (36.7 C), height 5' 3 (1.6 m), weight 195 lb (88.5 kg), last menstrual period 04/14/2016, SpO2 98%. Body mass index is 34.54 kg/m.  DIAGNOSTIC DATA REVIEWED:  BMET    Component Value Date/Time   NA 144 07/07/2020 0946   K 4.1 07/07/2020 0946   CL 104 07/07/2020 0946   CO2 25 07/07/2020 0946   GLUCOSE 108 (H) 07/07/2020 0946   GLUCOSE 110 (H) 01/07/2019 0930   BUN 17 07/07/2020 0946   CREATININE 0.93 07/07/2020 0946   CALCIUM 9.4 07/07/2020 0946   GFRNONAA 67 07/07/2020 0946   GFRAA 77 07/07/2020 0946   Lab Results  Component Value Date   HGBA1C 5.2 07/07/2020   HGBA1C 5.3 08/10/2016   Lab Results  Component Value Date   INSULIN  13.9 07/07/2020   INSULIN  38.9 (H) 08/10/2016   Lab Results  Component Value Date   TSH 1.340 07/07/2020   CBC    Component Value Date/Time   WBC 5.5 07/07/2020 0946   WBC 8.8 01/07/2019 0930   RBC 4.83 07/07/2020 0946   RBC 4.54 01/07/2019 0930   HGB 13.5 07/07/2020 0946   HCT 41.1 07/07/2020  0946   PLT 297 07/07/2020 0946   MCV 85 07/07/2020 0946   MCH 28.0 07/07/2020 0946   MCH 28.2 01/07/2019 0930   MCHC 32.8 07/07/2020 0946   MCHC 32.9 01/07/2019 0930   RDW 13.3 07/07/2020 0946   Iron Studies No results found for: IRON, TIBC, FERRITIN, IRONPCTSAT Lipid Panel     Component Value Date/Time   CHOL 254 (H) 07/07/2020 0946   TRIG 115 07/07/2020 0946   HDL 60 07/07/2020 0946   LDLCALC 174 (H) 07/07/2020 0946   Hepatic Function Panel     Component Value Date/Time   PROT 6.8  07/07/2020 0946   ALBUMIN 4.5 07/07/2020 0946   AST 19 07/07/2020 0946   ALT 16 07/07/2020 0946   ALKPHOS 88 07/07/2020 0946   BILITOT 0.5 07/07/2020 0946      Component Value Date/Time   TSH 1.340 07/07/2020 0946   Nutritional Lab Results  Component Value Date   VD25OH 51.6 04/18/2023   VD25OH 44.0 07/07/2020   VD25OH 40.1 02/25/2020     Assessment and Plan Assessment & Plan Obesity with polyphagia and emotional eating She has gained three pounds over the last month due to stress and emotional eating, particularly at night. She follows the category two ED plan about sixty percent of the time and is not currently exercising. She reports comfort eating and acknowledges a lack of discipline in her dietary habits, including increased consumption of mayonnaise and snacking. She is on Zypram 15 mg weekly for polyphagia and Lexapro  20 mg in the morning for emotional eating. - Continue category two ED plan - Refill Zypram 15 mg weekly - Refill Lexapro  20 mg in the morning - Encourage increased adherence to dietary plan - Encourage resumption of exercise routine - Discuss strategies to manage emotional eating, including planning healthy snacks and removing unhealthy options from the home  Emotional Eating Behaviors She is experiencing stress related to work and personal life, contributing to her emotional eating and weight gain. She is currently on Lexapro  20 mg in the morning for depression. - Continue Lexapro  20 mg in the morning - Work on recognizing emotional eating behaviors and having healthier snacks available when she has cravings  Vitamin D  deficiency She is being treated with ergocalciferol  50,000 IU weekly for vitamin D  deficiency. - Refill ergocalciferol  50,000 IU weekly     [name] was informed of the importance of frequent follow up visits to maximize her success with intensive lifestyle modifications for her obesity and obesity related health conditions as recommended  by USPSTF and CMS guidelines   Katherine Penton, MD

## 2024-04-28 ENCOUNTER — Ambulatory Visit

## 2024-04-30 ENCOUNTER — Other Ambulatory Visit: Payer: Self-pay

## 2024-04-30 ENCOUNTER — Ambulatory Visit

## 2024-04-30 DIAGNOSIS — S6980XA Other specified injuries of unspecified wrist, hand and finger(s), initial encounter: Secondary | ICD-10-CM | POA: Diagnosis not present

## 2024-04-30 DIAGNOSIS — S62623A Displaced fracture of medial phalanx of left middle finger, initial encounter for closed fracture: Secondary | ICD-10-CM | POA: Diagnosis not present

## 2024-04-30 DIAGNOSIS — M5481 Occipital neuralgia: Secondary | ICD-10-CM

## 2024-04-30 DIAGNOSIS — S62625A Displaced fracture of medial phalanx of left ring finger, initial encounter for closed fracture: Secondary | ICD-10-CM | POA: Diagnosis not present

## 2024-04-30 DIAGNOSIS — G4486 Cervicogenic headache: Secondary | ICD-10-CM

## 2024-04-30 MED ORDER — MELOXICAM 15 MG PO TABS
15.0000 mg | ORAL_TABLET | Freq: Every day | ORAL | 0 refills | Status: DC
  Filled 2024-04-30: qty 30, 30d supply, fill #0

## 2024-04-30 NOTE — Therapy (Addendum)
 OUTPATIENT PHYSICAL THERAPY TREATMENT/RE-CERT  Patient Name: Katherine Smith MRN: [id number] DOB:[birth year], 63 y.o., female Today's Date: 04/30/2024  END OF SESSION:  PT End of Session - 04/30/24 0807     Visit Number 7    Number of Visits 15    Date for PT Re-Evaluation 06/11/24   Authorization Type [employer]    Authorization Time Period 03/19/24-04/30/24    Progress Note Due on Visit 10    PT Start Time 0803    PT Stop Time 0845    PT Time Calculation (min) 42 min    Equipment Utilized During Treatment Gait belt    Activity Tolerance Patient tolerated treatment well          Past Medical History:  Diagnosis Date   Anxiety    Back pain    Cancer (HCC)    BASAL CELL SKIN CANCER   Depression    Fatty liver    GERD (gastroesophageal reflux disease)    Hashimoto's thyroiditis    HTN (hypertension)    Hyperlipidemia    Hypothyroidism    IBS (irritable bowel syndrome)    Insomnia    Joint pain    Lactose intolerance    Lichen sclerosus    Obesity    Psoriasis    Sleep apnea    Past Surgical History:  Procedure Laterality Date   ANTERIOR DISC ARTHROPLASTY     2004   CATARACT EXTRACTION W/ INTRAOCULAR LENS IMPLANT Bilateral    COLONOSCOPY  12/18/2011   COLONOSCOPY N/A 11/29/2022   Procedure: COLONOSCOPY;  Surgeon: Toledo, Ladell POUR, MD;  Location: [facility] ENDOSCOPY;  Service: Gastroenterology;  Laterality: N/A;  DM   COLONOSCOPY WITH PROPOFOL  N/A 04/04/2017   Procedure: COLONOSCOPY WITH PROPOFOL ;  Surgeon: Viktoria Lamar DASEN, MD;  Location: [facility] ENDOSCOPY;  Service: Endoscopy;  Laterality: N/A;   SHOULDER ARTHROSCOPY WITH ROTATOR CUFF REPAIR AND SUBACROMIAL DECOMPRESSION Left 07/12/2020   Procedure: SHOULDER ARTHROSCOPY WITH ARTHROSCOPIC  ROTATOR CUFF REPAIR AND SUBACROMIAL DECOMPRESSION;  Surgeon: Dozier Soulier, MD;  Location: [facility];  Service: Orthopedics;  Laterality: Left;   Patient Active Problem List   Diagnosis Date Noted   Chronic  intractable headache 11/12/2023   Stress 02/14/2023   Polyphagia 10/11/2022   Obesity, Beginning BMI 42.51 09/06/2022   Acquired hypothyroidism 08/29/2022   GERD (gastroesophageal reflux disease) 08/29/2022   Lichen sclerosus 08/29/2022   GAD (generalized anxiety disorder) 09/01/2020   Depression 01/08/2020   Primary insomnia 07/31/2018   Class 3 severe obesity with serious comorbidity and body mass index (BMI) of 40.0 to 44.9 in adult 02/07/2017   Elevated liver function tests 11/23/2016   B12 nutritional deficiency 11/23/2016   Essential hypertension 10/25/2016   Prediabetes 10/25/2016   BMI 33.0-33.9,adult 09/26/2016   Hashimoto's thyroiditis 09/07/2016   Sleep apnea 09/07/2016   Insulin  resistance 08/24/2016   Vitamin D  deficiency 08/24/2016   Hyperlipidemia 08/24/2016   Morbid obesity (HCC) 08/24/2016    PCP: Alla Amis, MD REFERRING PROVIDER: Onetha Epp, MD (neurology)  REFERRING DIAG: Occipital neuralgia   THERAPY DIAG:  Occipital neuralgia of right side  Cervicogenic headache  Rationale for Evaluation and Treatment: Rehabilitation  ONSET DATE: Chronic recurrent  SUBJECTIVE:  SUBJECTIVE STATEMENT:  Pt reports she had an injury to her L hand to digits 3-4 and dislocated them yesterday.  Pt reportedly put them back in place, but is planning on [facility].  She did it when attempting to assist her father from falling.    PERTINENT HISTORY:  63yoF referred to OPPT by neurology for chronic neck pain/occipital neuralgia on the right, with neck movement shooting pain up the back of the right head. Also reports history of  migraine, not very frequent. Pt has been started on Neurontin  by neurology which is helping some. Neurology  recommending injections for this in coming weeks. Pt reports similar problem over the last 3-5 years, managed well by her husband who is a Land, would have periods of remittance, however pt reports he lives out of state at present. 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 PMH: HA, Vit D deficiency, HLD, hashimoto's disease, OSA, HTN, B12 def, hypoTSH, GAD.  PAIN:  Are you having pain? 0 at present, 4/10 shooting occipital nerve pain on Right with certain head movements. PRECAUTIONS: None WEIGHT BEARING RESTRICTIONS: No FALLS:  Has patient fallen in last 6 months? 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  DIAGNOSTIC FINDINGS:  MRI: per neurology, no concerning finding related to occipital neuralgia, general age related cervical spondylosis.   PATIENT SURVEYS:  NDI: 30%  POSTURE: rigid mid to upper thoracic kyphosis with slight forward head posturing, pec tightness with Left > Rt scapular protraction at rest.   CERVICAL SPINE MOBILITY:   Active ROM A/PROM (deg) eval  Extension (degrees to LOG) 40*  Right lateral flexion 14*  Left lateral flexion 14*  Right rotation 55*  Left rotation 60*   (* = end range pain)  TREATMENT DATE 04/30/24 :                                                                                                                                 Manual therapy:   Supine STM to cervical region to increase extensibility of the paraspinals Supine suboccipital release technique to decrease cervicalgia Supine manual traction performed in order to increase joint space in cervical region for pain relief Supine cervical upglides/downglides, 30 sec bouts Supine UT 30 sec bouts to increase tissue extensibility of the  cervical region Supine R shoulder distraction for increased joint spacing, as pt has slightly reduced ROM on the R compared to the L, 30 sec bouts x4 attempts    Trigger Point Dry Needling  Subsequent Treatment: Instructions provided previously at initial dry needling treatment.   Patient Verbal Consent Given: Yes Education Handout Provided: Previously Provided Muscles Treated: B UT and B Cervical  Electrical Stimulation Performed: No Treatment  Response/Outcome: Pt with multiple twitches in the UT's and a significant reduction in overall tissue restriction upon reassessment.   Encouraged to perform guide relaxation meditation to improve breathing control and body awareness to reduce hyper activation of the UT.     PATIENT EDUCATION:  Education details: Review of HEP and provided handout today.  Pt educated throughout session about proper posture and technique with exercises. Improved exercise technique, movement at target joints, use of target muscles after min to mod verbal, visual, tactile cues  Person educated: Tai Education method: collaborative learning, deliberate practice, positive reinforcement, explicit instruction, establish rules. Education comprehension: generally good   HOME EXERCISE PROGRAM: Access Code: [email or web address] URL: [email or web address] Date: 03/19/2024 Prepared by: Peggye Linear & Sidra Simpers  Exercises - Standing Row with Anchored Resistance  - 3 x daily - 1 sets - 15 reps - 3sec hold - Standing Shoulder External Rotation with Resistance  - 3 x daily - 1 sets - 15 reps - 3sec hold - Supine Cervical Retraction with Towel  - 3 x daily - 1 sets - 15 reps - 3sec hold - Supine Thoracic Mobilization Towel Roll Vertical with Arm Stretch  - 2 x daily - 7 x weekly - 1 sets - 3 reps - 60sec hold - Wall Angels  - 1 x daily - 7 x weekly - 3 sets - 10 reps - Serratus Activation at Wall  - 1 x daily - 7 x weekly - 3 sets - 10  reps  ASSESSMENT:  CLINICAL IMPRESSION:  Pt responded well to the manual therapy, noting an increase in tissue restriction of the UT's, more noticeable in the L side than the R.  Pt responded favorably to the dry needling attempt and the paraspinals were also addressed at this time as well.   Pt will continue to benefit from skilled therapy to address remaining deficits in order to improve overall QoL and return to PLOF.        OBJECTIVE IMPAIRMENTS: Decreased knowledge of condition, decreased use of DME, decreased mobility, difficulty walking, decreased strength, decreased ROM. ACTIVITY LIMITATIONS: Lifting, standing, walking, squatting, transfers, locomotion level PARTICIPATION LIMITATIONS: Cleaning, laundry, interpersonal relationships, driving, yardwork, community activity.  PERSONAL FACTORS: Age, behavior pattern, education, past/current experiences, transportation, profession are also affecting patient's functional outcome.  REHAB POTENTIAL: Good  CLINICAL DECISION MAKING: Moderate  EVALUATION COMPLEXITY: Medium    GOALS: Goals reviewed with patient? Yes  SHORT TERM GOALS: Target date: 04/19/24  Pt to demonstrate improved cervical extension 50 degrees (standing to LOG) and rotation> 60 degrees bilat all without shooting nerve pain.  Baseline:  Goal status: INITIAL  2.  Pt to have a decrease in self reported disability pertaining to neck pain AEB reduced NDI score >10%.  Baseline:  Goal status: INITIAL  LONG TERM GOALS: Target date: 05/19/24  Pt to have a decrease in self reported disability pertaining to neck pain AEB reduced NDI score >20%. Baseline: 30%  Goal status: INITIAL  2.  Pt to demonstrate cervical rotation ROM >70 degrees bilat without shooting nerve pain to improve safety and performance in driving a motor vehicle.  Baseline:  Goal status: INITIAL  3.  Pt to have reduced muscle tightness and adaptive muscle shortening AEB cervical sidebedning >24 degrees  bilat without pain.  Baseline:  Goal status: INITIAL  4.  Pt to report confidence and consistency with long term HEP for DC to promote continued thoracic extension mobility and scapulothoracic strengthening to reduce recurrent of symptoms and to promote  age-related bone density health.  Baseline:  Goal status: INITIAL PLAN:  PT FREQUENCY: 1-2x/week  PT DURATION: 6 weeks PLANNED INTERVENTIONS: 97110-Therapeutic exercises, 97530- Therapeutic activity, [id number]- Neuromuscular re-education, 97535- Self Care, 02859- Manual therapy, G0283- Electrical stimulation (unattended), [phone]- Electrical stimulation (manual), 20560 (1-2 muscles), 20561 (3+ muscles)- Dry Needling, Patient/Family education, Joint mobilization, Spinal mobilization, and Moist heat  PLAN FOR NEXT SESSION:   Continue to review and progress HEP for technique and symptoms response Continue to advance scapulothoracic strengthening and ROM.  Assess for trigger point dry needling.   Fonda Simpers, PT, DPT Physical Therapist - [facility]  04/30/24, 8:08 AM

## 2024-05-05 ENCOUNTER — Other Ambulatory Visit: Payer: Self-pay

## 2024-05-05 ENCOUNTER — Ambulatory Visit

## 2024-05-07 ENCOUNTER — Ambulatory Visit

## 2024-05-07 DIAGNOSIS — M5481 Occipital neuralgia: Secondary | ICD-10-CM

## 2024-05-07 DIAGNOSIS — G4486 Cervicogenic headache: Secondary | ICD-10-CM | POA: Diagnosis not present

## 2024-05-07 NOTE — Therapy (Addendum)
 OUTPATIENT PHYSICAL THERAPY TREATMENT  Patient Name: Katherine Smith MRN: [id number] DOB:09-05-1960, 63 y.o., female Today's Date: 05/07/2024  END OF SESSION:  PT End of Session - 05/07/24 0804     Visit Number 8    Number of Visits 15   Date for Recertification  06/11/24   Authorization Type [employer]    Authorization Time Period 03/19/24-04/30/24    Progress Note Due on Visit 10    PT Start Time 0802    PT Stop Time 0845    PT Time Calculation (min) 43 min    Equipment Utilized During Treatment Gait belt    Activity Tolerance Patient tolerated treatment well          Past Medical History:  Diagnosis Date   Anxiety    Back pain    Cancer (HCC)    BASAL CELL SKIN CANCER   Depression    Fatty liver    GERD (gastroesophageal reflux disease)    Hashimoto's thyroiditis    HTN (hypertension)    Hyperlipidemia    Hypothyroidism    IBS (irritable bowel syndrome)    Insomnia    Joint pain    Lactose intolerance    Lichen sclerosus    Obesity    Psoriasis    Sleep apnea    Past Surgical History:  Procedure Laterality Date   ANTERIOR DISC ARTHROPLASTY     2004   CATARACT EXTRACTION W/ INTRAOCULAR LENS IMPLANT Bilateral    COLONOSCOPY  12/18/2011   COLONOSCOPY N/A 11/29/2022   Procedure: COLONOSCOPY;  Surgeon: Toledo, Ladell POUR, MD;  Location: [facility] END[facility]COPY;  Service: Gastroenterology;  Laterality: N/A;  DM   COLONOSCOPY WITH PROPOFOL  N/A 04/04/2017   Procedure: COLONOSCOPY WITH PROPOFOL ;  Surgeon: Viktoria Lamar DASEN, MD;  Location: [facility] END[facility]COPY;  Service: Endoscopy;  Laterality: N/A;   SHOULDER ARTHROSCOPY WITH ROTATOR CUFF REPAIR AND SUBACROMIAL DECOMPRESSION Left 07/12/2020   Procedure: SHOULDER ARTHROSCOPY WITH ARTHROSCOPIC  ROTATOR CUFF REPAIR AND SUBACROMIAL DECOMPRESSION;  Surgeon: Dozier Soulier, MD;  Location: [facility];  Service: Orthopedics;  Laterality: Left;   Patient Active Problem List   Diagnosis Date Noted   Chronic  intractable headache 11/12/2023   Stress 02/14/2023   Polyphagia 10/11/2022   Obesity, Beginning BMI 42.51 09/06/2022   Acquired hypothyroidism 08/29/2022   GERD (gastroesophageal reflux disease) 08/29/2022   Lichen sclerosus 08/29/2022   GAD (generalized anxiety disorder) 09/01/2020   Depression 01/08/2020   Primary insomnia 07/31/2018   Class 3 severe obesity with serious comorbidity and body mass index (BMI) of 40.0 to 44.9 in adult 02/07/2017   Elevated liver function tests 11/23/2016   B12 nutritional deficiency 11/23/2016   Essential hypertension 10/25/2016   Prediabetes 10/25/2016   BMI 33.0-33.9,adult 09/26/2016   Hashimoto's thyroiditis 09/07/2016   Sleep apnea 09/07/2016   Insulin  resistance 08/24/2016   Vitamin D  deficiency 08/24/2016   Hyperlipidemia 08/24/2016   Morbid obesity (HCC) 08/24/2016    PCP: Alla Amis, MD REFERRING PROVIDER: Onetha Epp, MD (neurology)  REFERRING DIAG: Occipital neuralgia   THERAPY DIAG:  Occipital neuralgia of right side  Cervicogenic headache  Rationale for Evaluation and Treatment: Rehabilitation  ONSET DATE: Chronic recurrent  SUBJECTIVE:  SUBJECTIVE STATEMENT:  Pt reports that she did get assessed for the L digits and was found to have an avulsion fracture on both of the 3rd and 4th digits.  Pt has an appointment next week with hand specialist.  Pt denies any pain at this point in time.    PERTINENT HISTORY:  63yoF referred to OPPT by neurology for chronic neck pain/occipital neuralgia on the right, with neck movement shooting pain up the back of the right head. Also reports history of  migraine, not very frequent. Pt has been started on Neurontin  by neurology which is helping some. Neurology recommending injections for  this in coming weeks. Pt reports similar problem over the last 3-5 years, managed well by her husband who is a Land, would have periods of remittance, however pt reports he lives out of state at present. 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 PMH: HA, Vit D deficiency, HLD, hashimoto's disease, OSA, HTN, B12 def, hypoTSH, GAD.  PAIN:  Are you having pain? 0 at present, 4/10 shooting occipital nerve pain on Right with certain head movements. PRECAUTIONS: None WEIGHT BEARING RESTRICTIONS: No FALLS:  Has patient fallen in last 6 months? 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  DIAGNOSTIC FINDINGS:  MRI: per neurology, no concerning finding related to occipital neuralgia, general age related cervical spondylosis.   PATIENT SURVEYS:  NDI: 30%  POSTURE: rigid mid to upper thoracic kyphosis with slight forward head posturing, pec tightness with Left > Rt scapular protraction at rest.   CERVICAL SPINE MOBILITY:   Active ROM A/PROM (deg) eval  Extension (degrees to LOG) 40*  Right lateral flexion 14*  Left lateral flexion 14*  Right rotation 55*  Left rotation 60*   (* = end range pain)  TREATMENT DATE 05/07/24 :                                                                                                                                 TherEx:  Prone scapular rows, one UE at a time, 2x10  Prone horizontal abduction with arm fully extended, 2x10 each UE Prone superman's for scapular activation, 2x10 Prone Y's, 2x10    Manual therapy:   Supine STM to cervical region to increase extensibility of the paraspinals Supine suboccipital release technique to decrease cervicalgia Supine manual traction performed in order to increase  joint space in cervical region for pain relief Supine cervical upglides/downglides, 30 sec bouts Supine UT 30 sec bouts to increase tissue extensibility of the cervical region Supine R shoulder distraction for increased joint spacing, as pt has slightly reduced ROM on the R compared to the L, 30 sec bouts x4 attempts    Trigger Point Dry Needling  Subsequent Treatment: Instructions  provided previously at initial dry needling treatment.   Patient Verbal Consent Given: Yes Education Handout Provided: Previously Provided Muscles Treated: B UT and suboccipitals Electrical Stimulation Performed: No Treatment Response/Outcome: Pt with multiple twitches in the UT's and a significant reduction in overall tissue restriction upon reassessment.   Encouraged to perform guide relaxation meditation to improve breathing control and body awareness to reduce hyper activation of the UT.     PATIENT EDUCATION:  Education details: Review of HEP and provided handout today.  Pt educated throughout session about proper posture and technique with exercises. Improved exercise technique, movement at target joints, use of target muscles after min to mod verbal, visual, tactile cues  Person educated: Lisaann Education method: collaborative learning, deliberate practice, positive reinforcement, explicit instruction, establish rules. Education comprehension: generally good   HOME EXERCISE PROGRAM: Access Code: [email or web address] URL: [email or web address] Date: 03/19/2024 Prepared by: Peggye Linear & Sidra Simpers  Exercises - Standing Row with Anchored Resistance  - 3 x daily - 1 sets - 15 reps - 3sec hold - Standing Shoulder External Rotation with Resistance  - 3 x daily - 1 sets - 15 reps - 3sec hold - Supine Cervical Retraction with Towel  - 3 x daily - 1 sets - 15 reps - 3sec hold - Supine Thoracic Mobilization Towel Roll Vertical with Arm Stretch  - 2 x daily - 7 x weekly - 1 sets - 3 reps - 60sec  hold - Wall Angels  - 1 x daily - 7 x weekly - 3 sets - 10 reps - Serratus Activation at Wall  - 1 x daily - 7 x weekly - 3 sets - 10 reps  ASSESSMENT:  CLINICAL IMPRESSION:  Pt continues to respond well to the manual therapy and dry needling approach.  Pt with significant twitches on the L UT.  Pt also introduced to prone exercises to improve the stabilization of the scapular region.  Pt noted it to be quite difficult and will continue to improve with exercises that target the area moving forward.   Pt will continue to benefit from skilled therapy to address remaining deficits in order to improve overall QoL and return to PLOF.         OBJECTIVE IMPAIRMENTS: Decreased knowledge of condition, decreased use of DME, decreased mobility, difficulty walking, decreased strength, decreased ROM. ACTIVITY LIMITATIONS: Lifting, standing, walking, squatting, transfers, locomotion level PARTICIPATION LIMITATIONS: Cleaning, laundry, interpersonal relationships, driving, yardwork, community activity.  PERSONAL FACTORS: Age, behavior pattern, education, past/current experiences, transportation, profession are also affecting patient's functional outcome.  REHAB POTENTIAL: Good  CLINICAL DECISION MAKING: Moderate  EVALUATION COMPLEXITY: Medium    GOALS: Goals reviewed with patient? Yes  SHORT TERM GOALS: Target date: 04/19/24  Pt to demonstrate improved cervical extension 50 degrees (standing to LOG) and rotation> 60 degrees bilat all without shooting nerve pain.  Baseline:  Goal status: INITIAL  2.  Pt to have a decrease in self reported disability pertaining to neck pain AEB reduced NDI score >10%.  Baseline:  Goal status: INITIAL  LONG TERM GOALS: Target date: 06/11/24  Pt to have a decrease in self reported disability pertaining to neck pain AEB reduced NDI score >20%. Baseline: 30%  Goal status: INITIAL  2.  Pt to demonstrate cervical rotation ROM >70 degrees bilat without shooting nerve  pain to improve safety and performance in driving a motor vehicle.  Baseline:  Goal status: INITIAL  3.  Pt to have reduced muscle tightness and adaptive muscle shortening AEB cervical  sidebedning >24 degrees bilat without pain.  Baseline:  Goal status: INITIAL  4.  Pt to report confidence and consistency with long term HEP for DC to promote continued thoracic extension mobility and scapulothoracic strengthening to reduce recurrent of symptoms and to promote age-related bone density health.  Baseline:  Goal status: INITIAL PLAN:  PT FREQUENCY: 1-2x/week  PT DURATION: 6 weeks PLANNED INTERVENTIONS: 97110-Therapeutic exercises, 97530- Therapeutic activity, [id number]- Neuromuscular re-education, 97535- Self Care, 02859- Manual therapy, G0283- Electrical stimulation (unattended), [phone]- Electrical stimulation (manual), 20560 (1-2 muscles), 20561 (3+ muscles)- Dry Needling, Patient/Family education, Joint mobilization, Spinal mobilization, and Moist heat  PLAN FOR NEXT SESSION:    Continue to review and progress HEP for technique and symptoms response Continue to advance scapulothoracic strengthening and ROM.  Assess for trigger point dry needling.   Fonda Simpers, PT, DPT Physical Therapist - [facility]  05/07/24, 11:14 AM

## 2024-05-12 ENCOUNTER — Ambulatory Visit

## 2024-05-13 DIAGNOSIS — S63635A Sprain of interphalangeal joint of left ring finger, initial encounter: Secondary | ICD-10-CM | POA: Diagnosis not present

## 2024-05-13 DIAGNOSIS — S63631A Sprain of interphalangeal joint of left index finger, initial encounter: Secondary | ICD-10-CM | POA: Diagnosis not present

## 2024-05-13 DIAGNOSIS — S63633A Sprain of interphalangeal joint of left middle finger, initial encounter: Secondary | ICD-10-CM | POA: Diagnosis not present

## 2024-05-14 ENCOUNTER — Ambulatory Visit: Attending: Neurology | Admitting: Physical Therapy

## 2024-05-14 DIAGNOSIS — M7918 Myalgia, other site: Secondary | ICD-10-CM | POA: Diagnosis not present

## 2024-05-14 DIAGNOSIS — G4486 Cervicogenic headache: Secondary | ICD-10-CM | POA: Insufficient documentation

## 2024-05-14 DIAGNOSIS — M5481 Occipital neuralgia: Secondary | ICD-10-CM | POA: Insufficient documentation

## 2024-05-14 NOTE — Therapy (Signed)
 OUTPATIENT PHYSICAL THERAPY TREATMENT  Patient Name: SHAWNTELL DIXSON MRN: [id number] DOB:09/11/1960, 63 y.o., female Today's Date: 05/14/2024  END OF SESSION:  PT End of Session - 05/14/24 0801     Visit Number 9    Number of Visits 12    Date for Recertification  04/30/24    Authorization Type [employer]    Authorization Time Period 03/19/24-04/30/24    Progress Note Due on Visit 10    PT Start Time 0803    PT Stop Time 0843    PT Time Calculation (min) 40 min    Equipment Utilized During Treatment Gait belt    Activity Tolerance Patient tolerated treatment well          Past Medical History:  Diagnosis Date   Anxiety    Back pain    Cancer (HCC)    BASAL CELL SKIN CANCER   Depression    Fatty liver    GERD (gastroesophageal reflux disease)    Hashimoto's thyroiditis    HTN (hypertension)    Hyperlipidemia    Hypothyroidism    IBS (irritable bowel syndrome)    Insomnia    Joint pain    Lactose intolerance    Lichen sclerosus    Obesity    Psoriasis    Sleep apnea    Past Surgical History:  Procedure Laterality Date   ANTERIOR DISC ARTHROPLASTY     2004   CATARACT EXTRACTION W/ INTRAOCULAR LENS IMPLANT Bilateral    COLONOSCOPY  12/18/2011   COLONOSCOPY N/A 11/29/2022   Procedure: COLONOSCOPY;  Surgeon: Toledo, Ladell POUR, MD;  Location: [facility] ENDOSCOPY;  Service: Gastroenterology;  Laterality: N/A;  DM   COLONOSCOPY WITH PROPOFOL  N/A 04/04/2017   Procedure: COLONOSCOPY WITH PROPOFOL ;  Surgeon: Viktoria Lamar DASEN, MD;  Location: [facility] ENDOSCOPY;  Service: Endoscopy;  Laterality: N/A;   SHOULDER ARTHROSCOPY WITH ROTATOR CUFF REPAIR AND SUBACROMIAL DECOMPRESSION Left 07/12/2020   Procedure: SHOULDER ARTHROSCOPY WITH ARTHROSCOPIC  ROTATOR CUFF REPAIR AND SUBACROMIAL DECOMPRESSION;  Surgeon: Dozier Soulier, MD;  Location: [facility];  Service: Orthopedics;  Laterality: Left;   Patient Active Problem List   Diagnosis Date Noted   Chronic  intractable headache 11/12/2023   Stress 02/14/2023   Polyphagia 10/11/2022   Obesity, Beginning BMI 42.51 09/06/2022   Acquired hypothyroidism 08/29/2022   GERD (gastroesophageal reflux disease) 08/29/2022   Lichen sclerosus 08/29/2022   GAD (generalized anxiety disorder) 09/01/2020   Depression 01/08/2020   Primary insomnia 07/31/2018   Class 3 severe obesity with serious comorbidity and body mass index (BMI) of 40.0 to 44.9 in adult (HCC) 02/07/2017   Elevated liver function tests 11/23/2016   B12 nutritional deficiency 11/23/2016   Essential hypertension 10/25/2016   Prediabetes 10/25/2016   BMI 33.0-33.9,adult 09/26/2016   Hashimoto's thyroiditis 09/07/2016   Sleep apnea 09/07/2016   Insulin  resistance 08/24/2016   Vitamin D  deficiency 08/24/2016   Hyperlipidemia 08/24/2016   Morbid obesity (HCC) 08/24/2016    PCP: Alla Amis, MD REFERRING PROVIDER: Onetha Epp, MD (neurology)  REFERRING DIAG: Occipital neuralgia   THERAPY DIAG:  Occipital neuralgia of right side  Cervicogenic headache  Rationale for Evaluation and Treatment: Rehabilitation  ONSET DATE: Chronic recurrent  SUBJECTIVE:  SUBJECTIVE STATEMENT:  Pt reports that she has been better since receiving injections and TDN last session. Reports a little bit of discomfort in the suboccipital region still but referral pain as improved.    PERTINENT HISTORY:  63yoF referred to OPPT by neurology for chronic neck pain/occipital neuralgia on the right, with neck movement shooting pain up the back of the right head. Also reports history of  migraine, not very frequent. Pt has been started on Neurontin  by neurology which is helping some. Neurology recommending injections for this in coming weeks. Pt reports similar  problem over the last 3-5 years, managed well by her husband who is a Land, would have periods of remittance, however pt reports he lives out of state at present. 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 PMH: HA, Vit D deficiency, HLD, hashimoto's disease, OSA, HTN, B12 def, hypoTSH, GAD.  PAIN:  Are you having pain? 0 at present, 4/10 shooting occipital nerve pain on Right with certain head movements. PRECAUTIONS: None WEIGHT BEARING RESTRICTIONS: No FALLS:  Has patient fallen in last 6 months? Yes 1 OCCUPATION: Outpatient pharmacist for [employer]  PLOF: fully inependent  PATIENT GOALS: stop shooting nerve pain   OBJECTIVE:  Note: Objective measures were completed at Evaluation unless otherwise noted.  DIAGNOSTIC FINDINGS:  MRI: per neurology, no concerning finding related to occipital neuralgia, general age related cervical spondylosis.   PATIENT SURVEYS:  NDI: 30%  POSTURE: rigid mid to upper thoracic kyphosis with slight forward head posturing, pec tightness with Left > Rt scapular protraction at rest.   CERVICAL SPINE MOBILITY:   Active ROM A/PROM (deg) eval  Extension (degrees to LOG) 40*  Right lateral flexion 14*  Left lateral flexion 14*  Right rotation 55*  Left rotation 60*   (* = end range pain)  TREATMENT DATE 05/14/24 :                                                                                                                                 TherEx:  Prone  shoulder extension palms up 2 x 12  Prone horizontal abduction with arm fully extended, 2x12  Prone Y's, 2x10 Prone W's 2 x 10   Supine Chin tucks 2 x 12   Standing scapular lift offs x 10 Standing horizontal abduction to robber stretch x 10  Cues for reduce shoulder elevation and reduce compensation through trunk and elbow due scapular weakness.    Manual therapy:   Prone:  Grade 2-3 PA mobilizations T1-T7 30  sec per segment. Severely limited joint mobility noted.  STM to R side rhomboid/middle trap x 1 min, UT x 3 min and teres major/minor x 2 min with multiple TP release.   Supine STM to cervical region to increase extensibility of the paraspinals  Supine suboccipital release technique to decrease cervicalgia Supine manual traction performed in order to increase  joint space in cervical region for pain relief Supine cervical upglides/downglides, 30 sec bouts Supine UT 30 sec bouts to increase tissue extensibility of the cervical region     Trigger Point Dry Needling  Subsequent Treatment: Instructions provided previously at initial dry needling treatment.   Patient Verbal Consent Given: Yes Education Handout Provided: Previously Provided Muscles Treated: B UT and suboccipitals Electrical Stimulation Performed: No Treatment Response/Outcome: Pt with multiple twitches in the UT's and a significant reduction in overall tissue restriction upon reassessment.   Encouraged to perform guide relaxation meditation to improve breathing control and body awareness to reduce hyper activation of the UT.     PATIENT EDUCATION:  Education details: Review of HEP and provided handout today.  Pt educated throughout session about proper posture and technique with exercises. Improved exercise technique, movement at target joints, use of target muscles after min to mod verbal, visual, tactile cues  Person educated: [place] Education method: collaborative learning, deliberate practice, positive reinforcement, explicit instruction, establish rules. Education comprehension: generally good   HOME EXERCISE PROGRAM: Access Code: [email or web address] URL: [email or web address] Date: 03/19/2024 Prepared by: Peggye Linear & Sidra Simpers  Exercises - Standing Row with Anchored Resistance  - 3 x daily - 1 sets - 15 reps - 3sec hold - Standing Shoulder External Rotation with Resistance  - 3 x daily - 1 sets -  15 reps - 3sec hold - Supine Cervical Retraction with Towel  - 3 x daily - 1 sets - 15 reps - 3sec hold - Supine Thoracic Mobilization Towel Roll Vertical with Arm Stretch  - 2 x daily - 7 x weekly - 1 sets - 3 reps - 60sec hold - Wall Angels  - 1 x daily - 7 x weekly - 3 sets - 10 reps - Serratus Activation at Wall  - 1 x daily - 7 x weekly - 3 sets - 10 reps  ASSESSMENT:  CLINICAL IMPRESSION:  Pt continues to respond well to the manual therapy and reports that she responded well to dry needling in last session. Continued prone and standing exercises to improve the stabilization of the scapular region.  Pt noted it to be quite difficult and will continue to improve with exercises that target the area moving forward.   Pt will continue to benefit from skilled therapy to address remaining deficits in order to improve overall QoL and return to PLOF.         OBJECTIVE IMPAIRMENTS: Decreased knowledge of condition, decreased use of DME, decreased mobility, difficulty walking, decreased strength, decreased ROM. ACTIVITY LIMITATIONS: Lifting, standing, walking, squatting, transfers, locomotion level PARTICIPATION LIMITATIONS: Cleaning, laundry, interpersonal relationships, driving, yardwork, community activity.  PERSONAL FACTORS: Age, behavior pattern, education, past/current experiences, transportation, profession are also affecting patient's functional outcome.  REHAB POTENTIAL: Good  CLINICAL DECISION MAKING: Moderate  EVALUATION COMPLEXITY: Medium    GOALS: Goals reviewed with patient? Yes  SHORT TERM GOALS: Target date: 04/19/24  Pt to demonstrate improved cervical extension 50 degrees (standing to LOG) and rotation> 60 degrees bilat all without shooting nerve pain.  Baseline:  Goal status: INITIAL  2.  Pt to have a decrease in self reported disability pertaining to neck pain AEB reduced NDI score >10%.  Baseline:  Goal status: INITIAL  LONG TERM GOALS: Target date: 05/19/24  Pt  to have a decrease in self reported disability pertaining to neck pain AEB reduced NDI score >20%. Baseline: 30%  Goal status: INITIAL  2.  Pt to demonstrate cervical rotation ROM >70 degrees  bilat without shooting nerve pain to improve safety and performance in driving a motor vehicle.  Baseline:  Goal status: INITIAL  3.  Pt to have reduced muscle tightness and adaptive muscle shortening AEB cervical sidebedning >24 degrees bilat without pain.  Baseline:  Goal status: INITIAL  4.  Pt to report confidence and consistency with long term HEP for DC to promote continued thoracic extension mobility and scapulothoracic strengthening to reduce recurrent of symptoms and to promote age-related bone density health.  Baseline:  Goal status: INITIAL PLAN:  PT FREQUENCY: 1-2x/week  PT DURATION: 6 weeks PLANNED INTERVENTIONS: 97110-Therapeutic exercises, 97530- Therapeutic activity, [id number]- Neuromuscular re-education, 97535- Self Care, 02859- Manual therapy, G0283- Electrical stimulation (unattended), [phone]- Electrical stimulation (manual), 20560 (1-2 muscles), 20561 (3+ muscles)- Dry Needling, Patient/Family education, Joint mobilization, Spinal mobilization, and Moist heat  PLAN FOR NEXT SESSION:    Continue to review and progress HEP for technique and symptoms response Continue to advance scapulothoracic strengthening and ROM.  Assess for trigger point dry needling.  Massie Dollar PT, DPT  Physical Therapist - [facility]  8:02 AM 05/14/24

## 2024-05-16 ENCOUNTER — Other Ambulatory Visit: Payer: Self-pay

## 2024-05-16 MED ORDER — FLUZONE 0.5 ML IM SUSY
0.5000 mL | PREFILLED_SYRINGE | Freq: Once | INTRAMUSCULAR | 0 refills | Status: AC
  Filled 2024-05-16: qty 0.5, 1d supply, fill #0

## 2024-05-16 MED ORDER — COMIRNATY 30 MCG/0.3ML IM SUSY
0.3000 mL | PREFILLED_SYRINGE | Freq: Once | INTRAMUSCULAR | 0 refills | Status: AC
Start: 2024-05-16 — End: 2024-05-17
  Filled 2024-05-16: qty 0.3, 1d supply, fill #0

## 2024-05-19 ENCOUNTER — Ambulatory Visit

## 2024-05-19 ENCOUNTER — Other Ambulatory Visit: Payer: Self-pay

## 2024-05-19 DIAGNOSIS — Z01419 Encounter for gynecological examination (general) (routine) without abnormal findings: Secondary | ICD-10-CM | POA: Diagnosis not present

## 2024-05-19 DIAGNOSIS — Z1331 Encounter for screening for depression: Secondary | ICD-10-CM | POA: Diagnosis not present

## 2024-05-19 DIAGNOSIS — N898 Other specified noninflammatory disorders of vagina: Secondary | ICD-10-CM | POA: Diagnosis not present

## 2024-05-19 MED ORDER — ESTRING 7.5 MCG/24HR VA RING
VAGINAL_RING | VAGINAL | 3 refills | Status: AC
  Filled 2024-05-19 (×2): qty 1, 84d supply, fill #0

## 2024-05-21 ENCOUNTER — Ambulatory Visit: Admitting: Physical Therapy

## 2024-05-21 ENCOUNTER — Telehealth: Payer: Self-pay

## 2024-05-21 ENCOUNTER — Ambulatory Visit (INDEPENDENT_AMBULATORY_CARE_PROVIDER_SITE_OTHER): Admitting: Family Medicine

## 2024-05-21 DIAGNOSIS — M5481 Occipital neuralgia: Secondary | ICD-10-CM | POA: Diagnosis not present

## 2024-05-21 DIAGNOSIS — S63615D Unspecified sprain of left ring finger, subsequent encounter: Secondary | ICD-10-CM | POA: Diagnosis not present

## 2024-05-21 DIAGNOSIS — G4486 Cervicogenic headache: Secondary | ICD-10-CM

## 2024-05-21 DIAGNOSIS — S63613D Unspecified sprain of left middle finger, subsequent encounter: Secondary | ICD-10-CM | POA: Diagnosis not present

## 2024-05-21 NOTE — Therapy (Signed)
 OUTPATIENT PHYSICAL THERAPY TREATMENT PHYSICAL THERAPY PROGRESS NOTE   Dates of reporting period  03/19/24   to   05/21/2024       Patient Name: Katherine Smith MRN: [id number] DOB:1960/10/19, 63 y.o., female Today's Date: 05/21/2024  END OF SESSION:  PT End of Session - 05/21/24 0805     Visit Number 10    Number of Visits 15    Date for Recertification  06/11/24   corrected   Authorization Type [employer]    Authorization Time Period 03/19/24-04/30/24    Progress Note Due on Visit 10    PT Start Time 0805    PT Stop Time 0845    PT Time Calculation (min) 40 min    Equipment Utilized During Treatment Gait belt    Activity Tolerance Patient tolerated treatment well          Past Medical History:  Diagnosis Date   Anxiety    Back pain    Cancer (HCC)    BASAL CELL SKIN CANCER   Depression    Fatty liver    GERD (gastroesophageal reflux disease)    Hashimoto's thyroiditis    HTN (hypertension)    Hyperlipidemia    Hypothyroidism    IBS (irritable bowel syndrome)    Insomnia    Joint pain    Lactose intolerance    Lichen sclerosus    Obesity    Psoriasis    Sleep apnea    Past Surgical History:  Procedure Laterality Date   ANTERIOR DISC ARTHROPLASTY     2004   CATARACT EXTRACTION W/ INTRAOCULAR LENS IMPLANT Bilateral    COLONOSCOPY  12/18/2011   COLONOSCOPY N/A 11/29/2022   Procedure: COLONOSCOPY;  Surgeon: Toledo, Ladell POUR, MD;  Location: [facility] ENDOSCOPY;  Service: Gastroenterology;  Laterality: N/A;  DM   COLONOSCOPY WITH PROPOFOL  N/A 04/04/2017   Procedure: COLONOSCOPY WITH PROPOFOL ;  Surgeon: Viktoria Lamar DASEN, MD;  Location[facility] END[facility];  Service: Endoscopy;  Laterality: N/A;   SHOULDER ARTHROSCOPY WITH ROTATOR CUFF REPAIR AND SUBACROMIAL DECOMPRESSION Left 07/12/2020   Procedure: SHOULDER ARTHROSCOPY WITH ARTHROSCOPIC  ROTATOR CUFF REPAIR AND SUBACROMIAL DECOMPRESSION;  Surgeon: Dozier Soulier, MD;  Location: [facility];   Service: Orthopedics;  Laterality: Left;   Patient Active Problem List   Diagnosis Date Noted   Chronic intractable headache 11/12/2023   Stress 02/14/2023   Polyphagia 10/11/2022   Obesity, Beginning BMI 42.51 09/06/2022   Acquired hypothyroidism 08/29/2022   GERD (gastroesophageal reflux disease) 08/29/2022   Lichen sclerosus 08/29/2022   GAD (generalized anxiety disorder) 09/01/2020   Depression 01/08/2020   Primary insomnia 07/31/2018   Class 3 severe obesity with serious comorbidity and body mass index (BMI) of 40.0 to 44.9 in adult (HCC) 02/07/2017   Elevated liver function tests 11/23/2016   B12 nutritional deficiency 11/23/2016   Essential hypertension 10/25/2016   Prediabetes 10/25/2016   BMI 33.0-33.9,adult 09/26/2016   Hashimoto's thyroiditis 09/07/2016   Sleep apnea 09/07/2016   Insulin  resistance 08/24/2016   Vitamin D  deficiency 08/24/2016   Hyperlipidemia 08/24/2016   Morbid obesity (HCC) 08/24/2016    PCP: Alla Amis, MD REFERRING PROVIDER: Onetha Epp, MD (neurology)  REFERRING DIAG: Occipital neuralgia   THERAPY DIAG:  Occipital neuralgia of right side  Cervicogenic headache  Rationale for Evaluation and Treatment: Rehabilitation  ONSET DATE: Chronic recurrent  SUBJECTIVE:  SUBJECTIVE STATEMENT:  Pt reports that she is doing well. Report no pain at rest at this day. Appointment with Orthopedics went well last week. No change in Medication or other medical updates. Is not wearing Buddy strap for fingers. Will be  seeing hand OT at [facility] this afternoon.   PERTINENT HISTORY:  63yoF referred to OPPT by neurology for chronic neck pain/occipital neuralgia on the right, with neck movement shooting pain up the back of the right head. Also reports  history of  migraine, not very frequent. Pt has been started on Neurontin  by neurology which is helping some. Neurology recommending injections for this in coming weeks. Pt reports similar problem over the last 3-5 years, managed well by her husband who is a Land, would have periods of remittance, however pt reports he lives out of state at present. 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 PMH: HA, Vit D deficiency, HLD, hashimoto's disease, OSA, HTN, B12 def, hypoTSH, GAD.  PAIN:  Are you having pain? 0 at present, 4/10 shooting occipital nerve pain on Right with certain head movements. PRECAUTIONS: None WEIGHT BEARING RESTRICTIONS: No FALLS:  Has patient fallen in last 6 months? 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  DIAGNOSTIC FINDINGS:  MRI: per neurology, no concerning finding related to occipital neuralgia, general age related cervical spondylosis.   PATIENT SURVEYS:  NDI: 30%  POSTURE: rigid mid to upper thoracic kyphosis with slight forward head posturing, pec tightness with Left > Rt scapular protraction at rest.   CERVICAL SPINE MOBILITY:   Active ROM A/PROM (deg) eval  Extension (degrees to LOG) 40*  Right lateral flexion 14*  Left lateral flexion 14*  Right rotation 55*  Left rotation 60*   (* = end range pain)  TREATMENT DATE 05/21/24 :                                                                                                                                 PT performed LTG assessment; see below for details.   TherEx: UBE x  2 min forward/ 1 min reverse. Mild discomfort in L hand with reverse to discontinued activity.   Prone  shoulder extension palms up  x 12  Prone Y's, 2x10 Prone W's 2 x 10      Manual therapy:  STM Seated UT and cervical paraspinal  STM x 5 min    Prone:  Grade 2-3 PA mobilizations C7-T7 30 sec per segment. Severely limited joint mobility noted, but slight improvement compared to prior PT treatment.  STM to R side rhomboid/middle trap x 1 min, UT x 3 Prone STM to cervical region to increase extensibility of the paraspinals and suboccipitals.   Trigger Point Dry Needling  Subsequent Treatment: Instructions provided previously at initial dry needling treatment.   Patient Verbal Consent Given: Yes Education Handout Provided: Previously Provided Muscles Treated:  R UT and suboccipitals Electrical Stimulation Performed: No Treatment Response/Outcome: Pt with multiple twitches in the UT's and a significant reduction in overall tissue restriction upon reassessment.   Encouraged to perform guide relaxation meditation to improve breathing control and body awareness to reduce hyper activation of the UT.     PATIENT EDUCATION:  Education details: Review of HEP and provided handout today.  Pt educated throughout session about proper posture and technique with exercises. Improved exercise technique, movement at target joints, use of target muscles after min to mod verbal, visual, tactile cues  Person educated: [place] Education method: collaborative learning, deliberate practice, positive reinforcement, explicit instruction, establish rules. Education comprehension: generally good   HOME EXERCISE PROGRAM: Access Code: [email or web address] URL: [email or web address] Date: 03/19/2024 Prepared by: Peggye Linear & Sidra Simpers  Exercises - Standing Row with Anchored Resistance  - 3 x daily - 1 sets - 15 reps - 3sec hold - Standing Shoulder External Rotation with Resistance  - 3 x daily - 1 sets - 15 reps - 3sec hold - Supine Cervical Retraction with Towel  - 3 x daily - 1 sets - 15 reps - 3sec hold - Supine Thoracic Mobilization Towel Roll Vertical with  Arm Stretch  - 2 x daily - 7 x weekly - 1 sets - 3 reps - 60sec hold - Wall Angels  - 1 x daily - 7 x weekly - 3 sets - 10 reps - Serratus Activation at Wall  - 1 x daily - 7 x weekly - 3 sets - 10 reps  ASSESSMENT:  CLINICAL IMPRESSION:  Pt continues to respond well to the manual therapy and reports that she responded well to interventions. Continued prone and standing exercises to improve the stabilization of the scapular region. Is noted to have reduced disability and reduced pain overall. Increased lateral flexion, but reduced rotation noted today.  Pt noted it to be quite difficult and will continue to improve with exercises that target the area moving forward.   Pt will continue to benefit from skilled therapy to address remaining deficits in order to improve overall QoL and return to PLOF.         OBJECTIVE IMPAIRMENTS: Decreased knowledge of condition, decreased use of DME, decreased mobility, difficulty walking, decreased strength, decreased ROM. ACTIVITY LIMITATIONS: Lifting, standing, walking, squatting, transfers, locomotion level PARTICIPATION LIMITATIONS: Cleaning, laundry, interpersonal relationships, driving, yardwork, community activity.  PERSONAL FACTORS: Age, behavior pattern, education, past/current experiences, transportation, profession are also affecting patient's functional outcome.  REHAB POTENTIAL: Good  CLINICAL DECISION MAKING: Moderate  EVALUATION COMPLEXITY: Medium    GOALS: Goals reviewed with patient? Yes  SHORT TERM GOALS: Target date: 04/19/24  Pt to demonstrate improved cervical extension 50 degrees (standing to LOG) and rotation> 60 degrees bilat all without shooting nerve pain.  Baseline: see below Goal status: inprogress  2.  Pt to have a decrease in self reported disability pertaining to neck pain AEB reduced NDI score >10%.  Baseline: 14% Goal status: MET  LONG TERM GOALS: Target date: 06/11/24  Pt to have a decrease in self reported  disability pertaining to neck pain AEB reduced NDI score >20%. Baseline: 30%  10/8: 14%  Goal status: MET  2.  Pt to demonstrate cervical rotation ROM >70 degrees bilat without shooting nerve pain to improve safety and performance in driving a motor vehicle.  Baseline:  60 with pain. R 50 L 52. Pain free  Goal status: in progress  3.  Pt to have reduced muscle tightness and adaptive muscle shortening AEB cervical sidebedning >24 degrees bilat without pain.  Baseline:  05/21/24: R 25; L 29 Goal status: MET  4.  Pt to report confidence and consistency with long term HEP for DC to promote continued thoracic extension mobility and scapulothoracic strengthening to reduce recurrent of symptoms and to promote age-related bone density health.  Baseline: mild consistency  Goal status: in progress PLAN:  PT FREQUENCY: 1-2x/week  PT DURATION: 6 weeks PLANNED INTERVENTIONS: 97110-Therapeutic exercises, 97530- Therapeutic activity, [id number]- Neuromuscular re-education, 97535- Self Care, 02859- Manual therapy, G0283- Electrical stimulation (unattended), [phone]- Electrical stimulation (manual), 20560 (1-2 muscles), 20561 (3+ muscles)- Dry Needling, Patient/Family education, Joint mobilization, Spinal mobilization, and Moist heat  PLAN FOR NEXT SESSION:    Continue to review and progress HEP for technique and symptoms response Continue to advance scapulothoracic strengthening and ROM.  Assess for trigger point dry needling.  Massie Dollar PT, DPT  Physical Therapist - [place]  [facility]  9:56 AM 05/21/24

## 2024-05-21 NOTE — Telephone Encounter (Signed)
 Called and left voicemail for patient to reschedule appointment on 12/10 with Dr Ines.  If patient calls back, they can be rescheduled with Dr Vear

## 2024-05-21 NOTE — Addendum Note (Signed)
 Addended by: SILVER FONDA ORN on: 05/21/2024 08:55 AM   Modules accepted: Orders

## 2024-05-25 ENCOUNTER — Other Ambulatory Visit: Payer: Self-pay

## 2024-05-26 ENCOUNTER — Ambulatory Visit

## 2024-05-28 ENCOUNTER — Ambulatory Visit

## 2024-05-28 DIAGNOSIS — M5481 Occipital neuralgia: Secondary | ICD-10-CM

## 2024-05-28 DIAGNOSIS — G4486 Cervicogenic headache: Secondary | ICD-10-CM

## 2024-05-28 NOTE — Therapy (Addendum)
 OUTPATIENT PHYSICAL THERAPY TREATMENT PHYSICAL THERAPY TREATMENT       Patient Name: Katherine Smith MRN: [id number] DOB:01/22/1961, 63 y.o., female Today's Date: 05/28/2024  END OF SESSION:  PT End of Session - 05/28/24 0807     Visit Number 11    Number of Visits 15    Date for Recertification  06/11/24   corrected   Authorization Type [employer]    Authorization Time Period 03/19/24-04/30/24    Progress Note Due on Visit 20    PT Start Time 0804    PT Stop Time 0845    PT Time Calculation (min) 41 min    Equipment Utilized During Treatment Gait belt    Activity Tolerance Patient tolerated treatment well           Past Medical History:  Diagnosis Date   Anxiety    Back pain    Cancer (HCC)    BASAL CELL SKIN CANCER   Depression    Fatty liver    GERD (gastroesophageal reflux disease)    Hashimoto's thyroiditis    HTN (hypertension)    Hyperlipidemia    Hypothyroidism    IBS (irritable bowel syndrome)    Insomnia    Joint pain    Lactose intolerance    Lichen sclerosus    Obesity    Psoriasis    Sleep apnea    Past Surgical History:  Procedure Laterality Date   ANTERIOR DISC ARTHROPLASTY     2004   CATARACT EXTRACTION W/ INTRAOCULAR LENS IMPLANT Bilateral    COLONOSCOPY  12/18/2011   COLONOSCOPY N/A 11/29/2022   Procedure: COLONOSCOPY;  Surgeon: Toledo, Ladell POUR, MD;  Location: [facility] ENDOSCOPY;  Service: Gastroenterology;  Laterality: N/A;  DM   COLONOSCOPY WITH PROPOFOL  N/A 04/04/2017   Procedure: COLONOSCOPY WITH PROPOFOL ;  Surgeon: Viktoria Lamar DASEN, MD;  Location: [facility];  Service: Endoscopy;  Laterality: N/A;   SHOULDER ARTHROSCOPY WITH ROTATOR CUFF REPAIR AND SUBACROMIAL DECOMPRESSION Left 07/12/2020   Procedure: SHOULDER ARTHROSCOPY WITH ARTHROSCOPIC  ROTATOR CUFF REPAIR AND SUBACROMIAL DECOMPRESSION;  Surgeon: Dozier Soulier, MD;  Location: [facility];  Service: Orthopedics;  Laterality: Left;   Patient Active  Problem List   Diagnosis Date Noted   Chronic intractable headache 11/12/2023   Stress 02/14/2023   Polyphagia 10/11/2022   Obesity, Beginning BMI 42.51 09/06/2022   Acquired hypothyroidism 08/29/2022   GERD (gastroesophageal reflux disease) 08/29/2022   Lichen sclerosus 08/29/2022   GAD (generalized anxiety disorder) 09/01/2020   Depression 01/08/2020   Primary insomnia 07/31/2018   Class 3 severe obesity with serious comorbidity and body mass index (BMI) of 40.0 to 44.9 in adult (HCC) 02/07/2017   Elevated liver function tests 11/23/2016   B12 nutritional deficiency 11/23/2016   Essential hypertension 10/25/2016   Prediabetes 10/25/2016   BMI 33.0-33.9,adult 09/26/2016   Hashimoto's thyroiditis 09/07/2016   Sleep apnea 09/07/2016   Insulin  resistance 08/24/2016   Vitamin D  deficiency 08/24/2016   Hyperlipidemia 08/24/2016   Morbid obesity (HCC) 08/24/2016    PCP: Alla Amis, MD REFERRING PROVIDER: Onetha Epp, MD (neurology)  REFERRING DIAG: Occipital neuralgia   THERAPY DIAG:  Occipital neuralgia of right side  Cervicogenic headache  Rationale for Evaluation and Treatment: Rehabilitation  ONSET DATE: Chronic recurrent  SUBJECTIVE:  SUBJECTIVE STATEMENT:  Pt reports doing well - no pain currently but does report ongoing right sided occipital pain   PERTINENT HISTORY:  63yoF referred to OPPT by neurology for chronic neck pain/occipital neuralgia on the right, with neck movement shooting pain up the back of the right head. Also reports history of  migraine, not very frequent. Pt has been started on Neurontin  by neurology which is helping some. Neurology recommending injections for this in coming weeks. Pt reports similar problem over the last 3-5 years, managed well  by her husband who is a Land, would have periods of remittance, however pt reports he lives out of state at present. 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 PMH: HA, Vit D deficiency, HLD, hashimoto's disease, OSA, HTN, B12 def, hypoTSH, GAD.  PAIN:  Are you having pain? 0 at present, 4/10 shooting occipital nerve pain on Right with certain head movements. PRECAUTIONS: None WEIGHT BEARING RESTRICTIONS: No FALLS:  Has patient fallen in last 6 months? 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  DIAGNOSTIC FINDINGS:  MRI: per neurology, no concerning finding related to occipital neuralgia, general age related cervical spondylosis.   PATIENT SURVEYS:  NDI: 30%  POSTURE: rigid mid to upper thoracic kyphosis with slight forward head posturing, pec tightness with Left > Rt scapular protraction at rest.   CERVICAL SPINE MOBILITY:   Active ROM A/PROM (deg) eval  Extension (degrees to LOG) 40*  Right lateral flexion 14*  Left lateral flexion 14*  Right rotation 55*  Left rotation 60*   (* = end range pain)  TREATMENT DATE 05/28/24 :                                                                                                                                 Active ROM A/PROM (deg) eval  Extension  44*  Right lateral flexion 34  Left lateral flexion 34  Right rotation 58  Left rotation 60   (* = end range pain)  TherEx:  Review of previous activities Prone  shoulder extension palms up  x 12  Prone Y's, 2x10 Prone W's 2 x 10  Added Prone Horizontal ABD- 2 x 10   Standing wall posture with chin retraction x 10  Standing wall angels x 10  Standing serratus at wall x 10  Standing Y at wall x 10 reps  Standing against wall -  horizontal shoulder abd    Manual therapy:  Suboccipital release hold 60 sec x 4 Seated UT and cervical paraspinal  STM x 5 min  Prone -Grade 2-3 PA mobilizations C7-T1 30 bouts per segment.  Supine cervical STM - specifically right side (increased tightness vs. Left) x several min  Self care:  Added  to HEP and issued updated handout   PATIENT EDUCATION:  Education details: Review of HEP and provided handout today.  Pt educated throughout session about proper posture and technique with exercises. Improved exercise technique, movement at target joints, use of target muscles after min to mod verbal, visual, tactile cues  Person educated: [place] Education method: collaborative learning, deliberate practice, positive reinforcement, explicit instruction, establish rules. Education comprehension: generally good   HOME EXERCISE PROGRAM: *UPDATED 05/28/2024 Access Code: [email or web address] URL: [email or web address] Date: 05/28/2024 Prepared by: Reyes London  Exercises - Standing Row with Anchored Resistance  - 3 x daily - 1 sets - 15 reps - 3sec hold - Standing Shoulder External Rotation with Resistance  - 3 x daily - 1 sets - 15 reps - 3sec hold - Supine Cervical Retraction with Towel  - 3 x daily - 1 sets - 15 reps - 3sec hold - Supine Thoracic Mobilization Towel Roll Vertical with Arm Stretch  - 2 x daily - 7 x weekly - 1 sets - 3 reps - 60sec hold - Wall Angels  - 1 x daily - 7 x weekly - 3 sets - 10 reps - Serratus Activation at Wall  - 1 x daily - 7 x weekly - 3 sets - 10 reps - Prone Single Arm Shoulder Horizontal Abduction with Scapular Retraction and Palm Down  - 3 x weekly - 3 sets - 10 reps - Prone Scapular Retraction Y  - 3 x weekly - 3 sets - 10 reps - Prone W Scapular Retraction  - 3 x weekly - 3 sets - 10 reps     Access Code: [email or web address] URL: [email or web address] Date: 03/19/2024 Prepared by: Peggye Linear & Sidra Simpers  Exercises -  Standing Row with Anchored Resistance  - 3 x daily - 1 sets - 15 reps - 3sec hold - Standing Shoulder External Rotation with Resistance  - 3 x daily - 1 sets - 15 reps - 3sec hold - Supine Cervical Retraction with Towel  - 3 x daily - 1 sets - 15 reps - 3sec hold - Supine Thoracic Mobilization Towel Roll Vertical with Arm Stretch  - 2 x daily - 7 x weekly - 1 sets - 3 reps - 60sec hold - Wall Angels  - 1 x daily - 7 x weekly - 3 sets - 10 reps - Serratus Activation at Wall  - 1 x daily - 7 x weekly - 3 sets - 10 reps  ASSESSMENT:  CLINICAL IMPRESSION:  Patient with no pain today so did not perform dry needling. Overall she continues to progress and reviewed/added more scapular/posture activities to HEP in anticipation of potential discharge to home program next visit. Her occipital pain is still present at times but her postural awareness has improved and she is knowledge of postural activities to perform for long term success. Overall cervical ROM has improved to date as well per measures today. Will reassess all goals next visit and discharge or recert as appropriate.       OBJECTIVE IMPAIRMENTS: Decreased knowledge of condition, decreased use of DME, decreased mobility, difficulty walking, decreased strength, decreased ROM. ACTIVITY LIMITATIONS: Lifting, standing, walking, squatting, transfers, locomotion level PARTICIPATION LIMITATIONS: Cleaning, laundry, interpersonal relationships, driving, yardwork, community activity.  PERSONAL FACTORS: Age, behavior pattern, education, past/current experiences, transportation, profession are also affecting patient's functional outcome.  REHAB POTENTIAL: Good  CLINICAL DECISION MAKING: Moderate  EVALUATION COMPLEXITY: Medium    GOALS: Goals reviewed with patient? Yes  SHORT TERM GOALS: Target date:  04/19/24  Pt to demonstrate improved cervical extension 50 degrees (standing to LOG) and rotation> 60 degrees bilat all without shooting nerve pain.   Baseline: see below Goal status: inprogress  2.  Pt to have a decrease in self reported disability pertaining to neck pain AEB reduced NDI score >10%.  Baseline: 14% Goal status: MET  LONG TERM GOALS: Target date: 06/11/24  Pt to have a decrease in self reported disability pertaining to neck pain AEB reduced NDI score >20%. Baseline: 30%  10/8: 14%  Goal status: MET  2.  Pt to demonstrate cervical rotation ROM >70 degrees bilat without shooting nerve pain to improve safety and performance in driving a motor vehicle.  Baseline: 60 with pain. R 50 L 52. Pain free  Goal status: in progress  3.  Pt to have reduced muscle tightness and adaptive muscle shortening AEB cervical sidebedning >24 degrees bilat without pain.  Baseline:  05/21/24: R 25; L 29 Goal status: MET  4.  Pt to report confidence and consistency with long term HEP for DC to promote continued thoracic extension mobility and scapulothoracic strengthening to reduce recurrent of symptoms and to promote age-related bone density health.  Baseline: mild consistency  Goal status: in progress PLAN:  PT FREQUENCY: 1-2x/week  PT DURATION: 6 weeks PLANNED INTERVENTIONS: 97110-Therapeutic exercises, 97530- Therapeutic activity, 97112- Neuromuscular re-education, 97535- Self Care, 02859- Manual therapy, G0283- Electrical stimulation (unattended), [phone]- Electrical stimulation (manual), 20560 (1-2 muscles), 20561 (3+ muscles)- Dry Needling, Patient/Family education, Joint mobilization, Spinal mobilization, and Moist heat  PLAN FOR NEXT SESSION:   Reassess all goals next session- Discharge if appropriate  Chyrl London, PT Physical Therapist - [facility]  [facility]  10:58 AM 05/28/24

## 2024-06-04 ENCOUNTER — Encounter

## 2024-06-10 ENCOUNTER — Other Ambulatory Visit: Payer: Self-pay

## 2024-06-10 DIAGNOSIS — S63615D Unspecified sprain of left ring finger, subsequent encounter: Secondary | ICD-10-CM | POA: Diagnosis not present

## 2024-06-10 DIAGNOSIS — M19042 Primary osteoarthritis, left hand: Secondary | ICD-10-CM | POA: Diagnosis not present

## 2024-06-10 DIAGNOSIS — S62603A Fracture of unspecified phalanx of left middle finger, initial encounter for closed fracture: Secondary | ICD-10-CM | POA: Diagnosis not present

## 2024-06-10 DIAGNOSIS — S63613D Unspecified sprain of left middle finger, subsequent encounter: Secondary | ICD-10-CM | POA: Diagnosis not present

## 2024-06-10 DIAGNOSIS — S62601A Fracture of unspecified phalanx of left index finger, initial encounter for closed fracture: Secondary | ICD-10-CM | POA: Diagnosis not present

## 2024-06-10 DIAGNOSIS — S62605A Fracture of unspecified phalanx of left ring finger, initial encounter for closed fracture: Secondary | ICD-10-CM | POA: Diagnosis not present

## 2024-06-10 DIAGNOSIS — S62607A Fracture of unspecified phalanx of left little finger, initial encounter for closed fracture: Secondary | ICD-10-CM | POA: Diagnosis not present

## 2024-06-10 MED ORDER — MELOXICAM 15 MG PO TABS
15.0000 mg | ORAL_TABLET | Freq: Every day | ORAL | 3 refills | Status: AC
  Filled 2024-06-10: qty 30, 30d supply, fill #0
  Filled 2024-07-05: qty 30, 30d supply, fill #1
  Filled 2024-08-08: qty 30, 30d supply, fill #2
  Filled 2024-09-16: qty 30, 30d supply, fill #3

## 2024-06-11 ENCOUNTER — Ambulatory Visit

## 2024-06-11 DIAGNOSIS — M5481 Occipital neuralgia: Secondary | ICD-10-CM | POA: Diagnosis not present

## 2024-06-11 DIAGNOSIS — G4486 Cervicogenic headache: Secondary | ICD-10-CM

## 2024-06-11 NOTE — Therapy (Signed)
 OUTPATIENT PHYSICAL THERAPY TREATMENT PHYSICAL THERAPY TREATMENT       Patient Name: Katherine Smith MRN: [id number] DOB:1961/03/06, 63 y.o., female Today's Date: 06/11/2024  END OF SESSION:  PT End of Session - 06/11/24 0758     Visit Number 12    Number of Visits 15    Date for Recertification  06/11/24   corrected   Authorization Type [employer]    Authorization Time Period 03/19/24-04/30/24    Progress Note Due on Visit 20    PT Start Time 0800    PT Stop Time 0840    PT Time Calculation (min) 40 min    Equipment Utilized During Treatment Gait belt    Activity Tolerance Patient tolerated treatment well    Behavior During Therapy WFL for tasks assessed/performed           Past Medical History:  Diagnosis Date   Anxiety    Back pain    Cancer (HCC)    BASAL CELL SKIN CANCER   Depression    Fatty liver    GERD (gastroesophageal reflux disease)    Hashimoto's thyroiditis    HTN (hypertension)    Hyperlipidemia    Hypothyroidism    IBS (irritable bowel syndrome)    Insomnia    Joint pain    Lactose intolerance    Lichen sclerosus    Obesity    Psoriasis    Sleep apnea    Past Surgical History:  Procedure Laterality Date   ANTERIOR DISC ARTHROPLASTY     2004   CATARACT EXTRACTION W/ INTRAOCULAR LENS IMPLANT Bilateral    COLONOSCOPY  12/18/2011   COLONOSCOPY N/A 11/29/2022   Procedure: COLONOSCOPY;  Surgeon: Toledo, Ladell POUR, MD;  Location: [facility] ENDOSCOPY;  Service: Gastroenterology;  Laterality: N/A;  DM   COLONOSCOPY WITH PROPOFOL  N/A 04/04/2017   Procedure: COLONOSCOPY WITH PROPOFOL ;  Surgeon: Viktoria Lamar DASEN, MD;  Location: [facility] ENDOSCOPY;  Service: Endoscopy;  Laterality: N/A;   SHOULDER ARTHROSCOPY WITH ROTATOR CUFF REPAIR AND SUBACROMIAL DECOMPRESSION Left 07/12/2020   Procedure: SHOULDER ARTHROSCOPY WITH ARTHROSCOPIC  ROTATOR CUFF REPAIR AND SUBACROMIAL DECOMPRESSION;  Surgeon: Dozier Soulier, MD;  Location: [facility];   Service: Orthopedics;  Laterality: Left;   Patient Active Problem List   Diagnosis Date Noted   Chronic intractable headache 11/12/2023   Stress 02/14/2023   Polyphagia 10/11/2022   Obesity, Beginning BMI 42.51 09/06/2022   Acquired hypothyroidism 08/29/2022   GERD (gastroesophageal reflux disease) 08/29/2022   Lichen sclerosus 08/29/2022   GAD (generalized anxiety disorder) 09/01/2020   Depression 01/08/2020   Primary insomnia 07/31/2018   Class 3 severe obesity with serious comorbidity and body mass index (BMI) of 40.0 to 44.9 in adult (HCC) 02/07/2017   Elevated liver function tests 11/23/2016   B12 nutritional deficiency 11/23/2016   Essential hypertension 10/25/2016   Prediabetes 10/25/2016   BMI 33.0-33.9,adult 09/26/2016   Hashimoto's thyroiditis 09/07/2016   Sleep apnea 09/07/2016   Insulin  resistance 08/24/2016   Vitamin D  deficiency 08/24/2016   Hyperlipidemia 08/24/2016   Morbid obesity (HCC) 08/24/2016    PCP: Alla Amis, MD REFERRING PROVIDER: Onetha Epp, MD (neurology)  REFERRING DIAG: Occipital neuralgia   THERAPY DIAG:  Occipital neuralgia of right side  Cervicogenic headache  Rationale for Evaluation and Treatment: Rehabilitation  ONSET DATE: Chronic recurrent  SUBJECTIVE:  SUBJECTIVE STATEMENT:  Pt reports continuing to do well - no pain currently only tightness on the right side of neck. Denies any headaches and report 95% better.    PERTINENT HISTORY:  63yoF referred to OPPT by neurology for chronic neck pain/occipital neuralgia on the right, with neck movement shooting pain up the back of the right head. Also reports history of  migraine, not very frequent. Pt has been started on Neurontin  by neurology which is helping some. Neurology  recommending injections for this in coming weeks. Pt reports similar problem over the last 3-5 years, managed well by her husband who is a land, would have periods of remittance, however pt reports he lives out of state at present. 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 PMH: HA, Vit D deficiency, HLD, hashimoto's disease, OSA, HTN, B12 def, hypoTSH, GAD.  PAIN:  Are you having pain? 0 at present, 4/10 shooting occipital nerve pain on Right with certain head movements. PRECAUTIONS: None WEIGHT BEARING RESTRICTIONS: No FALLS:  Has patient fallen in last 6 months? 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  DIAGNOSTIC FINDINGS:  MRI: per neurology, no concerning finding related to occipital neuralgia, general age related cervical spondylosis.   PATIENT SURVEYS:  NDI: 30%  POSTURE: rigid mid to upper thoracic kyphosis with slight forward head posturing, pec tightness with Left > Rt scapular protraction at rest.   CERVICAL SPINE MOBILITY:   Active ROM A/PROM (deg) eval  Extension (degrees to LOG) 40*  Right lateral flexion 14*  Left lateral flexion 14*  Right rotation 55*  Left rotation 60*   (* = end range pain)  TREATMENT DATE 06/11/24 :                                                                                                                                 Active ROM A/PROM (deg) eval  Extension  58  Right lateral flexion 30  Left lateral flexion 32  Right rotation 82  Left rotation 90   (* = end range pain)        Self care:  - Instructed in towel stretching with cervical ROT - hold 30 sec x 3 ea side.  -Reviewed all HOME PROGRAM today and patient perform 2 sets of 10 reps with Scapular retraction, post shoulder  rolls, standing chin retraction, wall posture stretch, wall angels.  Discussed home exercises vs. Gym based or group based classes (pilates)    PATIENT EDUCATION:  Education details: Review of HEP and provided handout today.  Pt educated throughout session about proper posture and technique with exercises. Improved exercise technique, movement at target joints, use of target muscles after min to mod verbal, visual, tactile cues  Person educated: [place]  Education method: collaborative learning, deliberate practice, positive reinforcement, explicit instruction, establish rules. Education comprehension: generally good   HOME EXERCISE PROGRAM: *UPDATED 05/28/2024 Access Code: [email or web address] URL: [email or web address] Date: 05/28/2024 Prepared by: Reyes London  Exercises - Standing Row with Anchored Resistance  - 3 x daily - 1 sets - 15 reps - 3sec hold - Standing Shoulder External Rotation with Resistance  - 3 x daily - 1 sets - 15 reps - 3sec hold - Supine Cervical Retraction with Towel  - 3 x daily - 1 sets - 15 reps - 3sec hold - Supine Thoracic Mobilization Towel Roll Vertical with Arm Stretch  - 2 x daily - 7 x weekly - 1 sets - 3 reps - 60sec hold - Wall Angels  - 1 x daily - 7 x weekly - 3 sets - 10 reps - Serratus Activation at Wall  - 1 x daily - 7 x weekly - 3 sets - 10 reps - Prone Single Arm Shoulder Horizontal Abduction with Scapular Retraction and Palm Down  - 3 x weekly - 3 sets - 10 reps - Prone Scapular Retraction Y  - 3 x weekly - 3 sets - 10 reps - Prone W Scapular Retraction  - 3 x weekly - 3 sets - 10 reps     Access Code: [email or web address] URL: [email or web address] Date: 03/19/2024 Prepared by: Peggye Linear & Sidra Simpers  Exercises - Standing Row with Anchored Resistance  - 3 x daily - 1 sets - 15 reps - 3sec hold - Standing Shoulder External Rotation with Resistance  - 3 x daily - 1 sets - 15 reps - 3sec hold - Supine Cervical  Retraction with Towel  - 3 x daily - 1 sets - 15 reps - 3sec hold - Supine Thoracic Mobilization Towel Roll Vertical with Arm Stretch  - 2 x daily - 7 x weekly - 1 sets - 3 reps - 60sec hold - Wall Angels  - 1 x daily - 7 x weekly - 3 sets - 10 reps - Serratus Activation at Wall  - 1 x daily - 7 x weekly - 3 sets - 10 reps  ASSESSMENT:  CLINICAL IMPRESSION:  Patient presents for last scheduled visit of this certification and reports she has been busy moving from her house. States she is doing great with no real headaches. Today- focused on reassessing remaining goals and patient presents with pain free cervical mobility- and much improved overall ROM. Spent majority of time reviewing Home program to ensure success and knowledge of activities to promote proper posture and scapular strengthening. She has met all goals at this time and appropriate for discharge today with all goals met.        OBJECTIVE IMPAIRMENTS: Decreased knowledge of condition, decreased use of DME, decreased mobility, difficulty walking, decreased strength, decreased ROM. ACTIVITY LIMITATIONS: Lifting, standing, walking, squatting, transfers, locomotion level PARTICIPATION LIMITATIONS: Cleaning, laundry, interpersonal relationships, driving, yardwork, community activity.  PERSONAL FACTORS: Age, behavior pattern, education, past/current experiences, transportation, profession are also affecting patient's functional outcome.  REHAB POTENTIAL: Good  CLINICAL DECISION MAKING: Moderate  EVALUATION COMPLEXITY: Medium    GOALS: Goals reviewed with patient? Yes  SHORT TERM GOALS: Target date: 04/19/24  Pt to demonstrate improved cervical extension 50 degrees (standing to LOG) and rotation> 60 degrees bilat all without shooting nerve pain.  Baseline: see below 06/11/2024= 58 deg without pain Goal status: MET  2.  Pt to have a decrease in self reported disability pertaining to neck pain AEB reduced  NDI score >10%.  Baseline:  14% Goal status: MET  LONG TERM GOALS: Target date: 06/11/24  Pt to have a decrease in self reported disability pertaining to neck pain AEB reduced NDI score >20%. Baseline: 30%  10/8: 14%  Goal status: MET  2.  Pt to demonstrate cervical rotation ROM >70 degrees bilat without shooting nerve pain to improve safety and performance in driving a motor vehicle.  Baseline: 60 with pain. R 50 L 52. Pain free  06/11/2024= L=90; R=82 (all without pain)  Goal status: MET  3.  Pt to have reduced muscle tightness and adaptive muscle shortening AEB cervical sidebedning >24 degrees bilat without pain.  Baseline:  05/21/24: R 25; L 29 06/11/2024= R= 30/L=32 Goal status: MET  4.  Pt to report confidence and consistency with long term HEP for DC to promote continued thoracic extension mobility and scapulothoracic strengthening to reduce recurrent of symptoms and to promote age-related bone density health.  Baseline: mild consistency; 06/11/2024= Patient reports understanding of current HEP and no questions at this time after review Goal status: MET PLAN:  PT FREQUENCY: 1-2x/week  PT DURATION: 6 weeks PLANNED INTERVENTIONS: 97110-Therapeutic exercises, 97530- Therapeutic activity, 97112- Neuromuscular re-education, 97535- Self Care, 02859- Manual therapy, G0283- Electrical stimulation (unattended), [phone]- Electrical stimulation (manual), 20560 (1-2 muscles), 20561 (3+ muscles)- Dry Needling, Patient/Family education, Joint mobilization, Spinal mobilization, and Moist heat  PLAN FOR NEXT SESSION:   Discharge patient today   Chyrl London, PT Physical Therapist - [facility]  [facility]  8:51 AM 06/11/24

## 2024-06-18 ENCOUNTER — Ambulatory Visit (INDEPENDENT_AMBULATORY_CARE_PROVIDER_SITE_OTHER): Payer: Self-pay | Admitting: Family Medicine

## 2024-06-18 ENCOUNTER — Encounter (INDEPENDENT_AMBULATORY_CARE_PROVIDER_SITE_OTHER): Payer: Self-pay | Admitting: Family Medicine

## 2024-06-18 ENCOUNTER — Ambulatory Visit

## 2024-06-18 VITALS — BP 119/75 | HR 77 | Temp 98.0°F | Ht 63.0 in | Wt 196.0 lb

## 2024-06-18 DIAGNOSIS — E559 Vitamin D deficiency, unspecified: Secondary | ICD-10-CM | POA: Diagnosis not present

## 2024-06-18 DIAGNOSIS — S63613D Unspecified sprain of left middle finger, subsequent encounter: Secondary | ICD-10-CM | POA: Diagnosis not present

## 2024-06-18 DIAGNOSIS — Z6834 Body mass index (BMI) 34.0-34.9, adult: Secondary | ICD-10-CM | POA: Diagnosis not present

## 2024-06-18 DIAGNOSIS — S63615D Unspecified sprain of left ring finger, subsequent encounter: Secondary | ICD-10-CM | POA: Diagnosis not present

## 2024-06-18 DIAGNOSIS — E669 Obesity, unspecified: Secondary | ICD-10-CM

## 2024-06-18 NOTE — Progress Notes (Signed)
 Office: [phone]  /  Fax: [phone]  WEIGHT SUMMARY AND BIOMETRICS  Anthropometric Measurements Height: 5' 3 (1.6 m) Weight: 196 lb (88.9 kg) BMI (Calculated): 34.73 Weight at Last Visit: 195 lb Weight Lost Since Last Visit: 0 Weight Gained Since Last Visit: 1 lb Starting Weight: 240 lb Total Weight Loss (lbs): 44 lb (20 kg) Peak Weight: 255 lb   Body Composition  Body Fat %: 43.9 % Fat Mass (lbs): 86.2 lbs Muscle Mass (lbs): 104.6 lbs Total Body Water (lbs): 75.2 lbs Visceral Fat Rating : 13   Other Clinical Data Fasting: no Labs: no Today's Visit #: 24 Starting Date: 08/10/16    Chief Complaint: OBESITY   History of Present Illness Katherine Smith is a 63 year old female who presents for obesity treatment and progress assessment.  She has been adhering to a category two eating plan about fifty percent of the time and is not currently engaging in formal exercise. However, she has been physically active due to packing and organizing her house for a move, which has contributed to her physical activity.  She recently sustained fractures to three fingers while assisting her mother's husband, who fell. This injury has necessitated occupational therapy and orthopedic appointments, adding to her busy schedule. Despite these challenges, she has managed to maintain her weight, gaining only one pound.  Her social history includes recent life changes such as selling her house and planning to move in with her brother and his wife. She has been eating out frequently due to her busy schedule and social gatherings, but she practices portion control, often giving her leftovers to her husband. She plans to focus more on herself once her living situation stabilizes.  She is currently on ergocalciferol  50,000 IU per week for vitamin D  deficiency, with her last vitamin D  level at 51.6, which is at goal.      PHYSICAL EXAM:  Blood pressure 119/75, pulse 77, temperature 98  F (36.7 C), height 5' 3 (1.6 m), weight 196 lb (88.9 kg), last menstrual period 04/14/2016, SpO2 96%. Body mass index is 34.72 kg/m.  DIAGNOSTIC DATA REVIEWED:  BMET    Component Value Date/Time   NA 144 07/07/2020 0946   K 4.1 07/07/2020 0946   CL 104 07/07/2020 0946   CO2 25 07/07/2020 0946   GLUCOSE 108 (H) 07/07/2020 0946   GLUCOSE 110 (H) 01/07/2019 0930   BUN 17 07/07/2020 0946   CREATININE 0.93 07/07/2020 0946   CALCIUM 9.4 07/07/2020 0946   GFRNONAA 67 07/07/2020 0946   GFRAA 77 07/07/2020 0946   Lab Results  Component Value Date   HGBA1C 5.2 07/07/2020   HGBA1C 5.3 08/10/2016   Lab Results  Component Value Date   INSULIN  13.9 07/07/2020   INSULIN  38.9 (H) 08/10/2016   Lab Results  Component Value Date   TSH 1.340 07/07/2020   CBC    Component Value Date/Time   WBC 5.5 07/07/2020 0946   WBC 8.8 01/07/2019 0930   RBC 4.83 07/07/2020 0946   RBC 4.54 01/07/2019 0930   HGB 13.5 07/07/2020 0946   HCT 41.1 07/07/2020 0946   PLT 297 07/07/2020 0946   MCV 85 07/07/2020 0946   MCH 28.0 07/07/2020 0946   MCH 28.2 01/07/2019 0930   MCHC 32.8 07/07/2020 0946   MCHC 32.9 01/07/2019 0930   RDW 13.3 07/07/2020 0946   Iron Studies No results found for: IRON, TIBC, FERRITIN, IRONPCTSAT Lipid Panel     Component Value Date/Time  CHOL 254 (H) 07/07/2020 0946   TRIG 115 07/07/2020 0946   HDL 60 07/07/2020 0946   LDLCALC 174 (H) 07/07/2020 0946   Hepatic Function Panel     Component Value Date/Time   PROT 6.8 07/07/2020 0946   ALBUMIN 4.5 07/07/2020 0946   AST 19 07/07/2020 0946   ALT 16 07/07/2020 0946   ALKPHOS 88 07/07/2020 0946   BILITOT 0.5 07/07/2020 0946      Component Value Date/Time   TSH 1.340 07/07/2020 0946   Nutritional Lab Results  Component Value Date   VD25OH 51.6 04/18/2023   VD25OH 44.0 07/07/2020   VD25OH 40.1 02/25/2020     Assessment and Plan Assessment & Plan Obesity (BMI 34.0-34.9) She is following a  category two eating plan about 50% of the time and is not engaging in formal exercise. Recent life events, including moving and family issues, have impacted her routine. She has managed to avoid significant weight gain despite increased eating out and social activities. - Continue category two eating plan. - Encouraged portion control and mindful eating. - Discussed [employer] as a potential meal option for higher protein content. - Scheduled follow-up appointments for December and January.  Vitamin D  deficiency, stable on ergocalciferol  therapy Vitamin D  deficiency is well-managed on ergocalciferol  50,000 IU weekly. Last vitamin D  level was at goal at 51.6. - Continue ergocalciferol  50,000 IU weekly. - Monitor vitamin D  levels as appropriate.  Katherine Smith was counseled on the importance of maintaining healthy lifestyle habits, including balanced nutrition, regular physical activity, and behavioral modifications, while taking antiobesity medication.  Patient verbalized understanding that medication is an adjunct to, not a replacement for, lifestyle changes and that the long-term success and weight maintenance depend on continued adherence to these strategies.   Katherine Smith was informed of the importance of frequent follow up visits to maximize her success with intensive lifestyle modifications for her obesity and obesity related health conditions as recommended by USPSTF and CMS guidelines   Katherine Penton, MD

## 2024-06-20 ENCOUNTER — Other Ambulatory Visit: Payer: Self-pay

## 2024-06-24 ENCOUNTER — Other Ambulatory Visit: Payer: Self-pay

## 2024-06-24 DIAGNOSIS — S63615D Unspecified sprain of left ring finger, subsequent encounter: Secondary | ICD-10-CM | POA: Diagnosis not present

## 2024-06-24 DIAGNOSIS — S63613D Unspecified sprain of left middle finger, subsequent encounter: Secondary | ICD-10-CM | POA: Diagnosis not present

## 2024-06-25 ENCOUNTER — Ambulatory Visit: Admitting: Physical Therapy

## 2024-07-02 ENCOUNTER — Ambulatory Visit

## 2024-07-05 ENCOUNTER — Other Ambulatory Visit (HOSPITAL_COMMUNITY): Payer: Self-pay

## 2024-07-05 ENCOUNTER — Other Ambulatory Visit: Payer: Self-pay

## 2024-07-07 ENCOUNTER — Other Ambulatory Visit: Payer: Self-pay

## 2024-07-09 ENCOUNTER — Other Ambulatory Visit (HOSPITAL_COMMUNITY): Payer: Self-pay

## 2024-07-09 ENCOUNTER — Other Ambulatory Visit: Payer: Self-pay

## 2024-07-09 ENCOUNTER — Encounter: Admitting: Physical Therapy

## 2024-07-09 DIAGNOSIS — S63613D Unspecified sprain of left middle finger, subsequent encounter: Secondary | ICD-10-CM | POA: Diagnosis not present

## 2024-07-09 DIAGNOSIS — S63615D Unspecified sprain of left ring finger, subsequent encounter: Secondary | ICD-10-CM | POA: Diagnosis not present

## 2024-07-09 MED ORDER — PANTOPRAZOLE SODIUM 40 MG PO TBEC
40.0000 mg | DELAYED_RELEASE_TABLET | Freq: Every day | ORAL | 1 refills | Status: AC
  Filled 2024-07-09 – 2024-07-11 (×2): qty 90, 90d supply, fill #0

## 2024-07-11 ENCOUNTER — Other Ambulatory Visit: Payer: Self-pay

## 2024-07-16 ENCOUNTER — Encounter

## 2024-07-22 DIAGNOSIS — R2232 Localized swelling, mass and lump, left upper limb: Secondary | ICD-10-CM | POA: Diagnosis not present

## 2024-07-22 DIAGNOSIS — M65342 Trigger finger, left ring finger: Secondary | ICD-10-CM | POA: Diagnosis not present

## 2024-07-22 DIAGNOSIS — M21242 Flexion deformity, left finger joints: Secondary | ICD-10-CM | POA: Diagnosis not present

## 2024-07-23 ENCOUNTER — Ambulatory Visit: Admitting: Neurology

## 2024-07-23 ENCOUNTER — Encounter

## 2024-07-30 ENCOUNTER — Encounter

## 2024-07-31 ENCOUNTER — Other Ambulatory Visit: Payer: Self-pay

## 2024-08-01 ENCOUNTER — Other Ambulatory Visit: Payer: Self-pay

## 2024-08-01 MED ORDER — EZETIMIBE 10 MG PO TABS
10.0000 mg | ORAL_TABLET | Freq: Every day | ORAL | 1 refills | Status: AC
  Filled 2024-08-01: qty 90, 90d supply, fill #0

## 2024-08-05 DIAGNOSIS — S63613D Unspecified sprain of left middle finger, subsequent encounter: Secondary | ICD-10-CM | POA: Diagnosis not present

## 2024-08-05 DIAGNOSIS — S63615D Unspecified sprain of left ring finger, subsequent encounter: Secondary | ICD-10-CM | POA: Diagnosis not present

## 2024-08-08 ENCOUNTER — Other Ambulatory Visit: Payer: Self-pay

## 2024-08-20 ENCOUNTER — Telehealth (INDEPENDENT_AMBULATORY_CARE_PROVIDER_SITE_OTHER): Payer: Self-pay | Admitting: Family Medicine

## 2024-08-20 ENCOUNTER — Ambulatory Visit (INDEPENDENT_AMBULATORY_CARE_PROVIDER_SITE_OTHER): Admitting: Family Medicine

## 2024-09-03 ENCOUNTER — Other Ambulatory Visit: Payer: Self-pay

## 2024-09-03 ENCOUNTER — Telehealth (INDEPENDENT_AMBULATORY_CARE_PROVIDER_SITE_OTHER): Admitting: Family Medicine

## 2024-09-03 ENCOUNTER — Other Ambulatory Visit (HOSPITAL_COMMUNITY): Payer: Self-pay

## 2024-09-03 ENCOUNTER — Encounter (INDEPENDENT_AMBULATORY_CARE_PROVIDER_SITE_OTHER): Payer: Self-pay | Admitting: Family Medicine

## 2024-09-03 VITALS — Ht 63.0 in | Wt 196.0 lb

## 2024-09-03 DIAGNOSIS — Z6834 Body mass index (BMI) 34.0-34.9, adult: Secondary | ICD-10-CM | POA: Diagnosis not present

## 2024-09-03 DIAGNOSIS — F3289 Other specified depressive episodes: Secondary | ICD-10-CM

## 2024-09-03 DIAGNOSIS — R632 Polyphagia: Secondary | ICD-10-CM | POA: Diagnosis not present

## 2024-09-03 DIAGNOSIS — E559 Vitamin D deficiency, unspecified: Secondary | ICD-10-CM | POA: Diagnosis not present

## 2024-09-03 MED ORDER — ESCITALOPRAM OXALATE 20 MG PO TABS
20.0000 mg | ORAL_TABLET | Freq: Every morning | ORAL | 0 refills | Status: AC
  Filled 2024-09-03 (×2): qty 90, 90d supply, fill #0

## 2024-09-03 MED ORDER — VITAMIN D (ERGOCALCIFEROL) 1.25 MG (50000 UNIT) PO CAPS
50000.0000 [IU] | ORAL_CAPSULE | ORAL | 0 refills | Status: AC
  Filled 2024-09-03 (×2): qty 12, 84d supply, fill #0

## 2024-09-03 MED ORDER — ZEPBOUND 15 MG/0.5ML ~~LOC~~ SOAJ
15.0000 mg | SUBCUTANEOUS | 0 refills | Status: AC
  Filled 2024-09-03: qty 2, 28d supply, fill #0

## 2024-09-03 MED ORDER — LEVOTHYROXINE SODIUM 112 MCG PO TABS
112.0000 ug | ORAL_TABLET | Freq: Every day | ORAL | 1 refills | Status: AC
  Filled 2024-09-03: qty 100, 100d supply, fill #0

## 2024-09-03 NOTE — Progress Notes (Signed)
 "  Office: [phone]  /  Fax: [phone]  WEIGHT SUMMARY AND BIOMETRICS  Anthropometric Measurements Height: 5' 3 (1.6 m) Weight: 196 lb (88.9 kg) (last visit) BMI (Calculated): 34.73 Weight at Last Visit: 196 lb Weight Lost Since Last Visit: 0 Weight Gained Since Last Visit: 0 Starting Weight: 240 lb Peak Weight: 255 lb   No data recorded Other Clinical Data Fasting: no Labs: no Today's Visit #: 51 Starting Date: 08/10/16 Comments: my chart video visit    Chief Complaint: OBESITY   Virtual Visit via A/V Note  I connected with Katherine Smith on 09/03/24 at 11:00 AM EST by audiovisual telehealth and verified that I am speaking with the correct person using two identifiers.  Location: Patient: home Provider: home   I discussed the limitations, risks, security and privacy concerns of performing an evaluation and management service by AV telehealth and the availability of in person appointments. I also discussed with the patient that there may be a patient responsible charge related to this service. The patient expressed understanding and agreed to proceed.     History of Present Illness Katherine Smith is a 64 year old female who presents for obesity treatment.  She is following the category two eating plan approximately 75% of the time and weighs 186 pounds at home, indicating weight loss. She is not currently engaging in exercise but is working on meeting her protein goals and increasing her intake of fruits and vegetables.  She is on Zepbound  15 mg weekly for polyphagia and requests a refill. She is also being treated for vitamin D  deficiency with ergocalciferol  50,000 IU weekly and requests a refill. Emotional eating behaviors are managed with Lexapro  20 mg daily, and she reports doing well on this regimen, requesting a refill.  She has recently moved in with family after selling her house, which has helped her maintain healthier eating habits due to routine  meals and reduced nighttime snacking. Despite experiencing work-related stress due to multiple changes this year, she feels capable of managing it and has not engaged in significant stress or comfort eating.      PHYSICAL EXAM:  Height 5' 3 (1.6 m), weight 196 lb (88.9 kg), last menstrual period 04/14/2016. Body mass index is 34.72 kg/m.  DIAGNOSTIC DATA REVIEWED BY MYSELF TODAY:  BMET    Component Value Date/Time   NA 144 07/07/2020 0946   K 4.1 07/07/2020 0946   CL 104 07/07/2020 0946   CO2 25 07/07/2020 0946   GLUCOSE 108 (H) 07/07/2020 0946   GLUCOSE 110 (H) 01/07/2019 0930   BUN 17 07/07/2020 0946   CREATININE 0.93 07/07/2020 0946   CALCIUM 9.4 07/07/2020 0946   GFRNONAA 67 07/07/2020 0946   GFRAA 77 07/07/2020 0946   Lab Results  Component Value Date   HGBA1C 5.2 07/07/2020   HGBA1C 5.3 08/10/2016   Lab Results  Component Value Date   INSULIN  13.9 07/07/2020   INSULIN  38.9 (H) 08/10/2016   Lab Results  Component Value Date   TSH 1.340 07/07/2020   CBC    Component Value Date/Time   WBC 5.5 07/07/2020 0946   WBC 8.8 01/07/2019 0930   RBC 4.83 07/07/2020 0946   RBC 4.54 01/07/2019 0930   HGB 13.5 07/07/2020 0946   HCT 41.1 07/07/2020 0946   PLT 297 07/07/2020 0946   MCV 85 07/07/2020 0946   MCH 28.0 07/07/2020 0946   MCH 28.2 01/07/2019 0930   MCHC 32.8 07/07/2020 0946   MCHC 32.9  01/07/2019 0930   RDW 13.3 07/07/2020 0946   Iron Studies No results found for: IRON, TIBC, FERRITIN, IRONPCTSAT Lipid Panel     Component Value Date/Time   CHOL 254 (H) 07/07/2020 0946   TRIG 115 07/07/2020 0946   HDL 60 07/07/2020 0946   LDLCALC 174 (H) 07/07/2020 0946   Hepatic Function Panel     Component Value Date/Time   PROT 6.8 07/07/2020 0946   ALBUMIN 4.5 07/07/2020 0946   AST 19 07/07/2020 0946   ALT 16 07/07/2020 0946   ALKPHOS 88 07/07/2020 0946   BILITOT 0.5 07/07/2020 0946      Component Value Date/Time   TSH 1.340 07/07/2020 0946    Nutritional Lab Results  Component Value Date   VD25OH 51.6 04/18/2023   VD25OH 44.0 07/07/2020   VD25OH 40.1 02/25/2020     Assessment and Plan Assessment & Plan Morbid obesity due to excess calories Morbid obesity managed with a category two eating plan, followed 75% of the time. Weight is 186 pounds with recent weight loss. Not currently exercising but working on meeting protein goals and increasing fruit and vegetable intake. Plans to increase physical activity post-retirement. - Continue category two eating plan - Encouraged increased physical activity post-retirement  Polyphagia Managed with Zepbound  15 mg weekly. Requests a refill. Stable. - Refilled Zepbound  15 mg weekly  Vitamin D  deficiency Managed with prescription ergocalciferol  50,000 IU weekly. Requests a refill. Stable - Refilled ergocalciferol  50,000 IU weekly  Emotional eating behaviors Managed with Lexapro  20 mg daily. Doing well and requests a refill. Recent life changes include moving in with family, which has helped with healthier eating habits and reduced nighttime snacking. Stress from work changes is manageable without significant stress or comfort eating. - Refilled Lexapro  20 mg daily      Patients who are on anti-obesity medications are counseled on the importance of maintaining healthy lifestyle habits, including balanced nutrition, regular physical activity, and behavioral modifications,  Medication is an adjunct to, not a replacement for, lifestyle changes and that the long-term success and weight maintenance depend on continued adherence to these strategies.   Jagger was informed of the importance of frequent follow up visits to maximize her success with intensive lifestyle modifications for her obesity and obesity related health conditions as recommended by USPSTF and CMS guidelines .  Louann Penton, MD   "

## 2024-09-04 ENCOUNTER — Other Ambulatory Visit: Payer: Self-pay

## 2024-09-16 ENCOUNTER — Other Ambulatory Visit: Payer: Self-pay

## 2024-09-18 ENCOUNTER — Other Ambulatory Visit: Payer: Self-pay

## 2024-10-08 ENCOUNTER — Ambulatory Visit (INDEPENDENT_AMBULATORY_CARE_PROVIDER_SITE_OTHER): Admitting: Family Medicine

## 2024-11-05 ENCOUNTER — Ambulatory Visit (INDEPENDENT_AMBULATORY_CARE_PROVIDER_SITE_OTHER): Admitting: Family Medicine
